# Patient Record
Sex: Female | Born: 1980
Health system: Southern US, Community
[De-identification: ages and names within clinical notes are randomized; demographics above are authoritative.]

## PROBLEM LIST (undated history)

## (undated) ENCOUNTER — Inpatient Hospital Stay (HOSPITAL_COMMUNITY): Payer: Self-pay

## (undated) ENCOUNTER — Emergency Department (HOSPITAL_COMMUNITY): Admission: EM | Payer: Self-pay | Source: Home / Self Care

## (undated) DIAGNOSIS — O139 Gestational [pregnancy-induced] hypertension without significant proteinuria, unspecified trimester: Secondary | ICD-10-CM

## (undated) DIAGNOSIS — D62 Acute posthemorrhagic anemia: Secondary | ICD-10-CM

## (undated) DIAGNOSIS — O99019 Anemia complicating pregnancy, unspecified trimester: Secondary | ICD-10-CM

## (undated) DIAGNOSIS — O009 Unspecified ectopic pregnancy without intrauterine pregnancy: Secondary | ICD-10-CM

## (undated) DIAGNOSIS — D509 Iron deficiency anemia, unspecified: Secondary | ICD-10-CM

## (undated) DIAGNOSIS — I2699 Other pulmonary embolism without acute cor pulmonale: Secondary | ICD-10-CM

## (undated) HISTORY — PX: HERNIA REPAIR: SHX51

## (undated) HISTORY — PX: HIP SURGERY: SHX245

## (undated) HISTORY — PX: BARIATRIC SURGERY: SHX1103

---

## 2011-12-06 ENCOUNTER — Emergency Department (HOSPITAL_COMMUNITY)
Admission: EM | Admit: 2011-12-06 | Discharge: 2011-12-06 | Disposition: A | Discharge status: Home / Self Care | Payer: Self-pay | Source: Home / Self Care | Attending: Family Medicine | Admitting: Family Medicine

## 2011-12-06 ENCOUNTER — Encounter (HOSPITAL_COMMUNITY): Payer: Self-pay | Admitting: *Deleted

## 2011-12-06 DIAGNOSIS — N912 Amenorrhea, unspecified: Secondary | ICD-10-CM

## 2011-12-06 DIAGNOSIS — N911 Secondary amenorrhea: Secondary | ICD-10-CM

## 2011-12-06 HISTORY — DX: Unspecified ectopic pregnancy without intrauterine pregnancy: O00.90

## 2011-12-06 LAB — POCT URINALYSIS DIP (DEVICE)
Bilirubin Urine: NEGATIVE
Hgb urine dipstick: NEGATIVE
Nitrite: NEGATIVE
Specific Gravity, Urine: 1.025 (ref 1.005–1.030)
pH: 5.5 (ref 5.0–8.0)

## 2011-12-06 NOTE — ED Notes (Signed)
Vitals obtained by RT student and RN Alinda Money

## 2011-12-06 NOTE — ED Notes (Signed)
Pt  Here  For  evaul of  Possible  Pregnancy   -  She  Reports  She  Has  Had   4  Pos  Home  Tests   And  2  Neg  Tests     -  She  Also  Reports  Some low abd  Pain   And  Nausea    -    She    denys  Any  Vag bleeding or  Any  Discharge      And  She  Appears  In no  Severe  Distress       She  Reports  History  Of      htn  During  Previous  preg  And       She   Also  Reports  History  Of    Ectopic    preg in past

## 2011-12-06 NOTE — Discharge Instructions (Signed)
We will call with test results, see doctor if further issues

## 2011-12-06 NOTE — ED Provider Notes (Signed)
History     CSN: [id number]  Arrival date & time 12/06/11  1405   First MD Initiated Contact with Patient 12/06/11 1601      Chief Complaint  Patient presents with  . Possible Pregnancy    (Consider location/radiation/quality/duration/timing/severity/associated sxs/prior treatment) Patient is a 31 y.o. female presenting with female genitourinary complaint. The history is provided by the patient.  Female GU Problem Primary symptoms include no pelvic pain and no vaginal bleeding. There has been no fever. This is a new problem. Episode onset: lmp in may. The problem has not changed since onset.Pregnant now: has had pos and neg home tests. She has missed her period. The patient's menstrual history has been regular. Sexual activity: sexually active.    Past Medical History  Diagnosis Date  . Ectopic pregnancy     Past Surgical History  Procedure Date  . Hip surgery     No family history on file.  History  Substance Use Topics  . Smoking status: Not on file  . Smokeless tobacco: Not on file  . Alcohol Use: Yes    OB History    Grav Para Term Preterm Abortions TAB SAB Ect Mult Living                  Review of Systems  Constitutional: Negative.   Genitourinary: Positive for menstrual problem. Negative for vaginal bleeding, vaginal discharge, vaginal pain and pelvic pain.    Allergies  Review of patient's allergies indicates no known allergies.  Home Medications  No current outpatient prescriptions on file.  BP 155/109  Pulse 91  Temp 99.5 F (37.5 C) (Oral)  Resp 20  SpO2 99%  LMP 11/08/2011  Physical Exam  Nursing note and vitals reviewed. Constitutional: She is oriented to person, place, and time. She appears well-developed and well-nourished.  Abdominal: Soft. Bowel sounds are normal. She exhibits no distension and no mass. There is no tenderness. There is no rebound and no guarding.  Neurological: She is alert and oriented to person, place, and time.    Skin: Skin is warm and dry. No erythema.    ED Course  Procedures (including critical care time)  Labs Reviewed  POCT URINALYSIS DIP (DEVICE) - Abnormal; Notable for the following:    Ketones, ur TRACE (*)     All other components within normal limits  POCT PREGNANCY, URINE  HCG, SERUM, QUALITATIVE   No results found.   1. Amenorrhea, secondary       MDM  u preg neg.        Linna Hoff, MD 12/06/11 1705

## 2011-12-29 ENCOUNTER — Encounter: Payer: Self-pay | Admitting: Obstetrics & Gynecology

## 2012-01-02 ENCOUNTER — Encounter: Payer: 59 | Admitting: Obstetrics & Gynecology

## 2012-01-03 ENCOUNTER — Ambulatory Visit (INDEPENDENT_AMBULATORY_CARE_PROVIDER_SITE_OTHER): Payer: 59 | Admitting: Obstetrics & Gynecology

## 2012-01-03 ENCOUNTER — Encounter: Payer: Self-pay | Admitting: Obstetrics & Gynecology

## 2012-01-03 DIAGNOSIS — Z124 Encounter for screening for malignant neoplasm of cervix: Secondary | ICD-10-CM

## 2012-01-03 DIAGNOSIS — Z113 Encounter for screening for infections with a predominantly sexual mode of transmission: Secondary | ICD-10-CM

## 2012-01-03 DIAGNOSIS — Z86711 Personal history of pulmonary embolism: Secondary | ICD-10-CM | POA: Insufficient documentation

## 2012-01-03 DIAGNOSIS — Z01419 Encounter for gynecological examination (general) (routine) without abnormal findings: Secondary | ICD-10-CM

## 2012-01-03 NOTE — Patient Instructions (Signed)
Contraception Choices Contraception (birth control) is the use of any methods or devices to prevent pregnancy. Below are some methods to help avoid pregnancy. HORMONAL METHODS   Contraceptive implant. This is a thin, plastic tube containing progesterone hormone. It does not contain estrogen hormone. Your caregiver inserts the tube in the inner part of the upper arm. The tube can remain in place for up to 3 years. After 3 years, the implant must be removed. The implant prevents the ovaries from releasing an egg (ovulation), thickens the cervical mucus which prevents sperm from entering the uterus, and thins the lining of the inside of the uterus.   Progesterone-only injections. These injections are given every 3 months by your caregiver to prevent pregnancy. This synthetic progesterone hormone stops the ovaries from releasing eggs. It also thickens cervical mucus and changes the uterine lining. This makes it harder for sperm to survive in the uterus.   Birth control pills. These pills contain estrogen and progesterone hormone. They work by stopping the egg from forming in the ovary (ovulation). Birth control pills are prescribed by a caregiver.Birth control pills can also be used to treat heavy periods.   Minipill. This type of birth control pill contains only the progesterone hormone. They are taken every day of each month and must be prescribed by your caregiver.   Birth control patch. The patch contains hormones similar to those in birth control pills. It must be changed once a week and is prescribed by a caregiver.   Vaginal ring. The ring contains hormones similar to those in birth control pills. It is left in the vagina for 3 weeks, removed for 1 week, and then a new one is put back in place. The patient must be comfortable inserting and removing the ring from the vagina.A caregiver's prescription is necessary.   Emergency contraception. Emergency contraceptives prevent pregnancy after  unprotected sexual intercourse. This pill can be taken right after sex or up to 5 days after unprotected sex. It is most effective the sooner you take the pills after having sexual intercourse. Emergency contraceptive pills are available without a prescription. Check with your pharmacist. Do not use emergency contraception as your only form of birth control.  BARRIER METHODS   Female condom. This is a thin sheath (latex or rubber) that is worn over the penis during sexual intercourse. It can be used with spermicide to increase effectiveness.   Female condom. This is a soft, loose-fitting sheath that is put into the vagina before sexual intercourse.   Diaphragm. This is a soft, latex, dome-shaped barrier that must be fitted by a caregiver. It is inserted into the vagina, along with a spermicidal jelly. It is inserted before intercourse. The diaphragm should be left in the vagina for 6 to 8 hours after intercourse.   Cervical cap. This is a round, soft, latex or plastic cup that fits over the cervix and must be fitted by a caregiver. The cap can be left in place for up to 48 hours after intercourse.   Sponge. This is a soft, circular piece of polyurethane foam. The sponge has spermicide in it. It is inserted into the vagina after wetting it and before sexual intercourse.   Spermicides. These are chemicals that kill or block sperm from entering the cervix and uterus. They come in the form of creams, jellies, suppositories, foam, or tablets. They do not require a prescription. They are inserted into the vagina with an applicator before having sexual intercourse. The process must be   repeated every time you have sexual intercourse.  INTRAUTERINE CONTRACEPTION  Intrauterine device (IUD). This is a T-shaped device that is put in a woman's uterus during a menstrual period to prevent pregnancy. There are 2 types:   Copper IUD. This type of IUD is wrapped in copper wire and is placed inside the uterus. Copper  makes the uterus and fallopian tubes produce a fluid that kills sperm. It can stay in place for 10 years.   Hormone IUD. This type of IUD contains the hormone progestin (synthetic progesterone). The hormone thickens the cervical mucus and prevents sperm from entering the uterus, and it also thins the uterine lining to prevent implantation of a fertilized egg. The hormone can weaken or kill the sperm that get into the uterus. It can stay in place for 5 years.  PERMANENT METHODS OF CONTRACEPTION  Female tubal ligation. This is when the woman's fallopian tubes are surgically sealed, tied, or blocked to prevent the egg from traveling to the uterus.   Female sterilization. This is when the female has the tubes that carry sperm tied off (vasectomy).This blocks sperm from entering the vagina during sexual intercourse. After the procedure, the man can still ejaculate fluid (semen).  NATURAL PLANNING METHODS  Natural family planning. This is not having sexual intercourse or using a barrier method (condom, diaphragm, cervical cap) on days the woman could become pregnant.   Calendar method. This is keeping track of the length of each menstrual cycle and identifying when you are fertile.   Ovulation method. This is avoiding sexual intercourse during ovulation.   Symptothermal method. This is avoiding sexual intercourse during ovulation, using a thermometer and ovulation symptoms.   Post-ovulation method. This is timing sexual intercourse after you have ovulated.  Regardless of which type or method of contraception you choose, it is important that you use condoms to protect against the transmission of sexually transmitted diseases (STDs). Talk with your caregiver about which form of contraception is most appropriate for you. Document Released: 05/29/2005 Document Revised: 05/18/2011 Document Reviewed: 10/05/2010 [employer] Patient Information 2012 [employer]. 

## 2012-01-03 NOTE — Progress Notes (Signed)
Patient ID: Yvonne Ali, female   DOB: Mar 23, 1981, 31 y.o.   MRN: [id number]  Chief Complaint  Patient presents with  . Gynecologic Exam    HPI Yvonne Ali is a 31 y.o. female.  Regular menses. Patient's last menstrual period was 12/08/2011. Patient uses condoms for birth control. She has a history of pulmonary embolism while using oral contraceptives as a teen. She has had HPV on Pap smears in the past but she has had normal followup. HPI  Past Medical History  Diagnosis Date  . Ectopic pregnancy     Past Surgical History  Procedure Date  . Hip surgery   . Bariatric surgery     gastric sleeve    Family History  Problem Relation Age of Onset  . Diabetes Maternal Grandmother   . Hypertension Maternal Grandfather   . Hyperlipidemia Maternal Grandfather   . Cancer Paternal Grandmother 46    ovarian cancer    Social History History  Substance Use Topics  . Smoking status: Never Smoker   . Smokeless tobacco: Never Used  . Alcohol Use: Yes     socially    No Known Allergies  No current outpatient prescriptions on file.    Review of Systems Review of Systems  Constitutional: Unexpected weight change: voluntary wgt loss.  Respiratory: Negative for shortness of breath.   Cardiovascular: Negative for leg swelling.  Gastrointestinal: Negative for diarrhea and constipation.  Genitourinary: Positive for frequency. Negative for vaginal discharge and pelvic pain.  Neurological: Negative for headaches.    Blood pressure 114/81, pulse 93, height 5\' 10"  (1.778 m), weight 140.615 kg (310 lb), last menstrual period 12/08/2011.  Physical Exam Physical Exam  Constitutional: No distress (obese).  Neck: Normal range of motion. Neck supple. No thyromegaly present.  Cardiovascular: Normal rate, regular rhythm and normal heart sounds.   Pulmonary/Chest: Effort normal and breath sounds normal.  Abdominal: Soft. She exhibits no distension and no mass. There is no tenderness.    Genitourinary: Vagina normal and uterus normal. No vaginal discharge (no mass) found.  Skin: Skin is warm and dry.  Psychiatric: She has a normal mood and affect. Her behavior is normal.    Data Reviewed Progress note from June   Assessment    Morbid obesity with good weight loss since bariatric surgery was performed. Regular menses. History of pulmonary embolism. Patient wishes to continue to use condoms for contraception.    Plan    Pap test STD testing and HIV testing done by her request. She can return for yearly examinations.       Loel Betancur 01/03/2012, 11:28 AM

## 2012-01-04 LAB — HIV ANTIBODY (ROUTINE TESTING W REFLEX): HIV: NONREACTIVE

## 2012-08-26 ENCOUNTER — Other Ambulatory Visit (INDEPENDENT_AMBULATORY_CARE_PROVIDER_SITE_OTHER): Payer: 59 | Admitting: *Deleted

## 2012-08-26 DIAGNOSIS — N912 Amenorrhea, unspecified: Secondary | ICD-10-CM

## 2012-08-26 LAB — POCT URINE PREGNANCY: Preg Test, Ur: NEGATIVE

## 2012-08-26 NOTE — Progress Notes (Signed)
Patient came today thinking her appointment with the physician was this morning when in actuality it is this afternoon at 1:15.  She is going to need to reschedule but would like a pregnancy test.  She is currently 8 days late for her period.  Her test is negative in the office today.  I have advised her to recheck a test in 5-7 days and follow up with [place] if it continues to be negative or of course if it is positive.  She agrees and went ahead and made a follow up appointment.

## 2012-09-03 ENCOUNTER — Ambulatory Visit: Payer: 59 | Admitting: Obstetrics & Gynecology

## 2012-09-03 ENCOUNTER — Other Ambulatory Visit (INDEPENDENT_AMBULATORY_CARE_PROVIDER_SITE_OTHER): Payer: 59 | Admitting: *Deleted

## 2012-09-03 ENCOUNTER — Encounter: Payer: Self-pay | Admitting: *Deleted

## 2012-09-03 DIAGNOSIS — N912 Amenorrhea, unspecified: Secondary | ICD-10-CM

## 2012-09-03 DIAGNOSIS — R102 Pelvic and perineal pain: Secondary | ICD-10-CM

## 2012-09-03 LAB — COMPREHENSIVE METABOLIC PANEL
Alkaline Phosphatase: 54 U/L (ref 39–117)
BUN: 11 mg/dL (ref 6–23)
CO2: 25 mEq/L (ref 19–32)
Creat: 0.82 mg/dL (ref 0.50–1.10)
Glucose, Bld: 78 mg/dL (ref 70–99)
Sodium: 140 mEq/L (ref 135–145)
Total Bilirubin: 0.3 mg/dL (ref 0.3–1.2)

## 2012-09-03 LAB — POCT URINALYSIS DIPSTICK
Bilirubin, UA: NEGATIVE
Glucose, UA: NEGATIVE
Ketones, UA: NEGATIVE
Leukocytes, UA: NEGATIVE
Spec Grav, UA: >=1.03

## 2012-09-03 LAB — TSH: TSH: 0.426 u[IU]/mL (ref 0.350–4.500)

## 2012-09-03 NOTE — Progress Notes (Signed)
Patients cycle was 10 days late this past time and it was suggested she have blood work done at her last visit to determine what might be going on with the irregularities.  She is also having some right lower quadrant pain and feels like she needs to be checked for a uti.  Blood work done and urine dip is negative.  Patient will make a follow up appointment to be seen to follow up on her discomfort and her labs.

## 2012-09-04 LAB — FOLLICLE STIMULATING HORMONE: FSH: 3.1 m[IU]/mL

## 2012-09-04 LAB — TESTOSTERONE: Testosterone: 68 ng/dL (ref 10–70)

## 2012-11-27 ENCOUNTER — Ambulatory Visit (INDEPENDENT_AMBULATORY_CARE_PROVIDER_SITE_OTHER): Payer: 59 | Admitting: Family Medicine

## 2012-11-27 VITALS — BP 119/92 | Wt 333.0 lb

## 2012-11-27 DIAGNOSIS — Z3481 Encounter for supervision of other normal pregnancy, first trimester: Secondary | ICD-10-CM

## 2012-11-27 DIAGNOSIS — Z3480 Encounter for supervision of other normal pregnancy, unspecified trimester: Secondary | ICD-10-CM

## 2012-11-27 DIAGNOSIS — Z8742 Personal history of other diseases of the female genital tract: Secondary | ICD-10-CM

## 2012-11-27 DIAGNOSIS — Z8759 Personal history of other complications of pregnancy, childbirth and the puerperium: Secondary | ICD-10-CM

## 2012-11-27 NOTE — Progress Notes (Signed)
P-87.  Pt would like to have an ultrasound due to history of ectopic pregnancy in the past.

## 2012-11-28 ENCOUNTER — Ambulatory Visit (HOSPITAL_COMMUNITY): Admission: RE | Admit: 2012-11-28 | Payer: 59 | Source: Ambulatory Visit

## 2012-11-28 ENCOUNTER — Ambulatory Visit (HOSPITAL_COMMUNITY)
Admission: RE | Admit: 2012-11-28 | Discharge: 2012-11-28 | Disposition: A | Discharge status: Home / Self Care | Payer: 59 | Source: Ambulatory Visit | Attending: Obstetrics & Gynecology | Admitting: Obstetrics & Gynecology

## 2012-11-28 DIAGNOSIS — Z8759 Personal history of other complications of pregnancy, childbirth and the puerperium: Secondary | ICD-10-CM

## 2012-11-28 DIAGNOSIS — O3680X0 Pregnancy with inconclusive fetal viability, not applicable or unspecified: Secondary | ICD-10-CM | POA: Insufficient documentation

## 2012-11-28 DIAGNOSIS — Z3689 Encounter for other specified antenatal screening: Secondary | ICD-10-CM | POA: Insufficient documentation

## 2012-11-28 DIAGNOSIS — E669 Obesity, unspecified: Secondary | ICD-10-CM | POA: Insufficient documentation

## 2012-11-28 DIAGNOSIS — O99210 Obesity complicating pregnancy, unspecified trimester: Secondary | ICD-10-CM | POA: Insufficient documentation

## 2012-12-01 LAB — CULTURE, OB URINE
Colony Count: NO GROWTH
Organism ID, Bacteria: NO GROWTH

## 2012-12-10 ENCOUNTER — Ambulatory Visit (INDEPENDENT_AMBULATORY_CARE_PROVIDER_SITE_OTHER): Payer: 59 | Admitting: Obstetrics and Gynecology

## 2012-12-10 ENCOUNTER — Encounter: Payer: Self-pay | Admitting: Obstetrics and Gynecology

## 2012-12-10 VITALS — BP 135/85 | Wt 337.0 lb

## 2012-12-10 DIAGNOSIS — O99214 Obesity complicating childbirth: Secondary | ICD-10-CM

## 2012-12-10 DIAGNOSIS — Z86711 Personal history of pulmonary embolism: Secondary | ICD-10-CM

## 2012-12-10 DIAGNOSIS — Z1151 Encounter for screening for human papillomavirus (HPV): Secondary | ICD-10-CM

## 2012-12-10 DIAGNOSIS — O0990 Supervision of high risk pregnancy, unspecified, unspecified trimester: Secondary | ICD-10-CM

## 2012-12-10 DIAGNOSIS — O99210 Obesity complicating pregnancy, unspecified trimester: Secondary | ICD-10-CM | POA: Insufficient documentation

## 2012-12-10 DIAGNOSIS — Z124 Encounter for screening for malignant neoplasm of cervix: Secondary | ICD-10-CM

## 2012-12-10 DIAGNOSIS — Z113 Encounter for screening for infections with a predominantly sexual mode of transmission: Secondary | ICD-10-CM

## 2012-12-10 DIAGNOSIS — O99211 Obesity complicating pregnancy, first trimester: Secondary | ICD-10-CM

## 2012-12-10 DIAGNOSIS — O0991 Supervision of high risk pregnancy, unspecified, first trimester: Secondary | ICD-10-CM

## 2012-12-10 MED ORDER — ENOXAPARIN SODIUM 40 MG/0.4ML ~~LOC~~ SOLN: 40.0000 mg | SUBCUTANEOUS | Status: DC

## 2012-12-10 NOTE — Progress Notes (Signed)
P-85 

## 2012-12-10 NOTE — Progress Notes (Signed)
   Subjective:    Yvonne Ali is a G2P0010 [redacted]w[redacted]d being seen today for her first obstetrical visit.  Her obstetrical history is significant for prior ectopic pregancy, obesity in pregnancy s/p bariatric surgery and history of PE associated with OCP usage. Patient does intend to breast feed. Pregnancy history fully reviewed.  Patient reports nausea.  Filed Vitals:   12/10/12 0924  BP: 135/85  Weight: 337 lb (152.862 kg)    HISTORY: OB History   Grav Para Term Preterm Abortions TAB SAB Ect Mult Living   2    1   1        # Outc Date GA Lbr Len/2nd Wgt Sex Del Anes PTL Lv   1 ECT            2 CUR              Past Medical History  Diagnosis Date  . Ectopic pregnancy    Past Surgical History  Procedure Laterality Date  . Hip surgery    . Bariatric surgery      gastric sleeve   Family History  Problem Relation Age of Onset  . Hypertension Maternal Grandfather   . Hyperlipidemia Maternal Grandfather   . Cancer Paternal Grandmother 59    ovarian cancer  . Diabetes Paternal Grandmother      Exam    Uterus:     Pelvic Exam:    Perineum: Normal Perineum   Vulva: normal   Vagina:  normal mucosa   pH:    Cervix: closed, long, posterior   Adnexa: no mass, fullness, tenderness   Bony Pelvis: android  System: Breast:  normal appearance, no masses or tenderness, No nipple retraction or dimpling, No nipple discharge or bleeding, No axillary or supraclavicular adenopathy   Skin: normal coloration and turgor, no rashes    Neurologic: oriented, no focal deficits   Extremities: normal strength, tone, and muscle mass   HEENT extra ocular movement intact   Mouth/Teeth dental hygiene good   Neck supple and no masses   Cardiovascular: regular rate and rhythm   Respiratory:  chest clear, no wheezing, crepitations, rhonchi, normal symmetric air entry   Abdomen: soft, NT, obese   Urinary:       Assessment:    Pregnancy: G2P0010 Patient Active Problem List   Diagnosis  Date Noted  . Supervision of high risk pregnancy in first trimester 12/10/2012    Priority: Medium  . Obesities, morbid 01/03/2012    Priority: Medium  . History of pulmonary embolus (PE) 01/03/2012    Priority: Medium        Plan:     Initial labs drawn- difficult access to vein secondary to body habitus Prenatal vitamins. Problem list reviewed and updated. Genetic Screening discussed First Screen: requested.  Ultrasound discussed; fetal survey: requested. Will be ordered at subsequent visit Given history of PE in the past with start patient on prophylactic lovenox dosage 40 mg Edom daily. Patient is familiar with lovenox injection and declined teaching Patient with morbid obesity- will need 1 hr GCT at next visit Discussed no more than 20 lb weigh gain in pregnacy Encouraged exercise regimen  Follow up in 4 weeks. 50% of 30 min visit spent on counseling and coordination of care.     Yvonne Ali 12/10/2012

## 2012-12-10 NOTE — Patient Instructions (Signed)
Pregnancy - First Trimester During sexual intercourse, millions of sperm go into the vagina. Only 1 sperm will penetrate and fertilize the female egg while it is in the Fallopian tube. One week later, the fertilized egg implants into the wall of the uterus. An embryo begins to develop into a baby. At 6 to 8 weeks, the eyes and face are formed and the heartbeat can be seen on ultrasound. At the end of 12 weeks (first trimester), all the baby's organs are formed. Now that you are pregnant, you will want to do everything you can to have a healthy baby. Two of the most important things are to get good prenatal care and follow your caregiver's instructions. Prenatal care is all the medical care you receive before the baby's birth. It is given to prevent, find, and treat problems during the pregnancy and childbirth. PRENATAL EXAMS  During prenatal visits, your weight, blood pressure, and urine are checked. This is done to make sure you are healthy and progressing normally during the pregnancy.  A pregnant woman should gain 25 to 35 pounds during the pregnancy. However, if you are overweight or underweight, your caregiver will advise you regarding your weight.  Your caregiver will ask and answer questions for you.  Blood work, cervical cultures, other necessary tests, and a Pap test are done during your prenatal exams. These tests are done to check on your health and the probable health of your baby. Tests are strongly recommended and done for HIV with your permission. This is the virus that causes AIDS. These tests are done because medicines can be given to help prevent your baby from being born with this infection should you have been infected without knowing it. Blood work is also used to find out your blood type, previous infections, and follow your blood levels (hemoglobin).  Low hemoglobin (anemia) is common during pregnancy. Iron and vitamins are given to help prevent this. Later in the pregnancy,  blood tests for diabetes will be done along with any other tests if any problems develop.  You may need other tests to make sure you and the baby are doing well. CHANGES DURING THE FIRST TRIMESTER  Your body goes through many changes during pregnancy. They vary from person to person. Talk to your caregiver about changes you notice and are concerned about. Changes can include:  Your menstrual period stops.  The egg and sperm carry the genes that determine what you look like. Genes from you and your partner are forming a baby. The female genes determine whether the baby is a boy or a girl.  Your body increases in girth and you may feel bloated.  Feeling sick to your stomach (nauseous) and throwing up (vomiting). If the vomiting is uncontrollable, call your caregiver.  Your breasts will begin to enlarge and become tender.  Your nipples may stick out more and become darker.  The need to urinate more. Painful urination may mean you have a bladder infection.  Tiring easily.  Loss of appetite.  Cravings for certain kinds of food.  At first, you may gain or lose a couple of pounds.  You may have changes in your emotions from day to day (excited to be pregnant or concerned something may go wrong with the pregnancy and baby).  You may have more vivid and strange dreams. HOME CARE INSTRUCTIONS   It is very important to avoid all smoking, alcohol and non-prescribed drugs during your pregnancy. These affect the formation and growth of the baby.   Avoid chemicals while pregnant to ensure the delivery of a healthy infant.  Start your prenatal visits by the 12th week of pregnancy. They are usually scheduled monthly at first, then more often in the last 2 months before delivery. Keep your caregiver's appointments. Follow your caregiver's instructions regarding medicine use, blood and lab tests, exercise, and diet.  During pregnancy, you are providing food for you and your baby. Eat regular,  well-balanced meals. Choose foods such as meat, fish, milk and other low fat dairy products, vegetables, fruits, and whole-grain breads and cereals. Your caregiver will tell you of the ideal weight gain.  You can help morning sickness by keeping soda crackers at the bedside. Eat a couple before arising in the morning. You may want to use the crackers without salt on them.  Eating 4 to 5 small meals rather than 3 large meals a day also may help the nausea and vomiting.  Drinking liquids between meals instead of during meals also seems to help nausea and vomiting.  A physical sexual relationship may be continued throughout pregnancy if there are no other problems. Problems may be early (premature) leaking of amniotic fluid from the membranes, vaginal bleeding, or belly (abdominal) pain.  Exercise regularly if there are no restrictions. Check with your caregiver or physical therapist if you are unsure of the safety of some of your exercises. Greater weight gain will occur in the last 2 trimesters of pregnancy. Exercising will help:  Control your weight.  Keep you in shape.  Prepare you for labor and delivery.  Help you lose your pregnancy weight after you deliver your baby.  Wear a good support or jogging bra for breast tenderness during pregnancy. This may help if worn during sleep too.  Ask when prenatal classes are available. Begin classes when they are offered.  Do not use hot tubs, steam rooms, or saunas.  Wear your seat belt when driving. This protects you and your baby if you are in an accident.  Avoid raw meat, uncooked cheese, cat litter boxes, and soil used by cats throughout the pregnancy. These carry germs that can cause birth defects in the baby.  The first trimester is a good time to visit your dentist for your dental health. Getting your teeth cleaned is okay. Use a softer toothbrush and brush gently during pregnancy.  Ask for help if you have financial, counseling, or  nutritional needs during pregnancy. Your caregiver will be able to offer counseling for these needs as well as refer you for other special needs.  Do not take any medicines or herbs unless told by your caregiver.  Inform your caregiver if there is any mental or physical domestic violence.  Make a list of emergency phone numbers of family, friends, hospital, and police and fire departments.  Write down your questions. Take them to your prenatal visit.  Do not douche.  Do not cross your legs.  If you have to stand for long periods of time, rotate you feet or take small steps in a circle.  You may have more vaginal secretions that may require a sanitary pad. Do not use tampons or scented sanitary pads. MEDICINES AND DRUG USE IN PREGNANCY  Take prenatal vitamins as directed. The vitamin should contain 1 milligram of folic acid. Keep all vitamins out of reach of children. Only a couple vitamins or tablets containing iron may be fatal to a baby or young child when ingested.  Avoid use of all medicines, including herbs, over-the-counter medicines, not   prescribed or suggested by your caregiver. Only take over-the-counter or prescription medicines for pain, discomfort, or fever as directed by your caregiver. Do not use aspirin, ibuprofen, or naproxen unless directed by your caregiver.  Let your caregiver also know about herbs you may be using.  Alcohol is related to a number of birth defects. This includes fetal alcohol syndrome. All alcohol, in any form, should be avoided completely. Smoking will cause low birth rate and premature babies.  Street or illegal drugs are very harmful to the baby. They are absolutely forbidden. A baby born to an addicted mother will be addicted at birth. The baby will go through the same withdrawal an adult does.  Let your caregiver know about any medicines that you have to take and for what reason you take them. SEEK MEDICAL CARE IF:  You have any concerns or  worries during your pregnancy. It is better to call with your questions if you feel they cannot wait, rather than worry about them. SEEK IMMEDIATE MEDICAL CARE IF:   An unexplained oral temperature above 102 F (38.9 C) develops, or as your caregiver suggests.  You have leaking of fluid from the vagina (birth canal). If leaking membranes are suspected, take your temperature and inform your caregiver of this when you call.  There is vaginal spotting or bleeding. Notify your caregiver of the amount and how many pads are used.  You develop a bad smelling vaginal discharge with a change in the color.  You continue to feel sick to your stomach (nauseated) and have no relief from remedies suggested. You vomit blood or coffee ground-like materials.  You lose more than 2 pounds of weight in 1 week.  You gain more than 2 pounds of weight in 1 week and you notice swelling of your face, hands, feet, or legs.  You gain 5 pounds or more in 1 week (even if you do not have swelling of your hands, face, legs, or feet).  You get exposed to German measles and have never had them.  You are exposed to fifth disease or chickenpox.  You develop belly (abdominal) pain. Round ligament discomfort is a common non-cancerous (benign) cause of abdominal pain in pregnancy. Your caregiver still must evaluate this.  You develop headache, fever, diarrhea, pain with urination, or shortness of breath.  You fall or are in a car accident or have any kind of trauma.  There is mental or physical violence in your home. Document Released: 05/23/2001 Document Revised: 02/21/2012 Document Reviewed: 11/24/2008 [employer] Patient Information 2014 [employer].  Contraception Choices Contraception (birth control) is the use of any methods or devices to prevent pregnancy. Below are some methods to help avoid pregnancy. HORMONAL METHODS   Contraceptive implant. This is a thin, plastic tube containing progesterone hormone. It  does not contain estrogen hormone. Your caregiver inserts the tube in the inner part of the upper arm. The tube can remain in place for up to 3 years. After 3 years, the implant must be removed. The implant prevents the ovaries from releasing an egg (ovulation), thickens the cervical mucus which prevents sperm from entering the uterus, and thins the lining of the inside of the uterus.  Progesterone-only injections. These injections are given every 3 months by your caregiver to prevent pregnancy. This synthetic progesterone hormone stops the ovaries from releasing eggs. It also thickens cervical mucus and changes the uterine lining. This makes it harder for sperm to survive in the uterus.  Birth control pills. These pills   contain estrogen and progesterone hormone. They work by stopping the egg from forming in the ovary (ovulation). Birth control pills are prescribed by a caregiver.Birth control pills can also be used to treat heavy periods.  Minipill. This type of birth control pill contains only the progesterone hormone. They are taken every day of each month and must be prescribed by your caregiver.  Birth control patch. The patch contains hormones similar to those in birth control pills. It must be changed once a week and is prescribed by a caregiver.  Vaginal ring. The ring contains hormones similar to those in birth control pills. It is left in the vagina for 3 weeks, removed for 1 week, and then a new one is put back in place. The patient must be comfortable inserting and removing the ring from the vagina.A caregiver's prescription is necessary.  Emergency contraception. Emergency contraceptives prevent pregnancy after unprotected sexual intercourse. This pill can be taken right after sex or up to 5 days after unprotected sex. It is most effective the sooner you take the pills after having sexual intercourse. Emergency contraceptive pills are available without a prescription. Check with your  pharmacist. Do not use emergency contraception as your only form of birth control. BARRIER METHODS   Female condom. This is a thin sheath (latex or rubber) that is worn over the penis during sexual intercourse. It can be used with spermicide to increase effectiveness.  Female condom. This is a soft, loose-fitting sheath that is put into the vagina before sexual intercourse.  Diaphragm. This is a soft, latex, dome-shaped barrier that must be fitted by a caregiver. It is inserted into the vagina, along with a spermicidal jelly. It is inserted before intercourse. The diaphragm should be left in the vagina for 6 to 8 hours after intercourse.  Cervical cap. This is a round, soft, latex or plastic cup that fits over the cervix and must be fitted by a caregiver. The cap can be left in place for up to 48 hours after intercourse.  Sponge. This is a soft, circular piece of polyurethane foam. The sponge has spermicide in it. It is inserted into the vagina after wetting it and before sexual intercourse.  Spermicides. These are chemicals that kill or block sperm from entering the cervix and uterus. They come in the form of creams, jellies, suppositories, foam, or tablets. They do not require a prescription. They are inserted into the vagina with an applicator before having sexual intercourse. The process must be repeated every time you have sexual intercourse. INTRAUTERINE CONTRACEPTION  Intrauterine device (IUD). This is a T-shaped device that is put in a woman's uterus during a menstrual period to prevent pregnancy. There are 2 types:  Copper IUD. This type of IUD is wrapped in copper wire and is placed inside the uterus. Copper makes the uterus and fallopian tubes produce a fluid that kills sperm. It can stay in place for 10 years.  Hormone IUD. This type of IUD contains the hormone progestin (synthetic progesterone). The hormone thickens the cervical mucus and prevents sperm from entering the uterus, and it  also thins the uterine lining to prevent implantation of a fertilized egg. The hormone can weaken or kill the sperm that get into the uterus. It can stay in place for 5 years. PERMANENT METHODS OF CONTRACEPTION  Female tubal ligation. This is when the woman's fallopian tubes are surgically sealed, tied, or blocked to prevent the egg from traveling to the uterus.  Female sterilization. This is when   the female has the tubes that carry sperm tied off (vasectomy).This blocks sperm from entering the vagina during sexual intercourse. After the procedure, the man can still ejaculate fluid (semen). NATURAL PLANNING METHODS  Natural family planning. This is not having sexual intercourse or using a barrier method (condom, diaphragm, cervical cap) on days the woman could become pregnant.  Calendar method. This is keeping track of the length of each menstrual cycle and identifying when you are fertile.  Ovulation method. This is avoiding sexual intercourse during ovulation.  Symptothermal method. This is avoiding sexual intercourse during ovulation, using a thermometer and ovulation symptoms.  Post-ovulation method. This is timing sexual intercourse after you have ovulated. Regardless of which type or method of contraception you choose, it is important that you use condoms to protect against the transmission of sexually transmitted diseases (STDs). Talk with your caregiver about which form of contraception is most appropriate for you. Document Released: 05/29/2005 Document Revised: 08/21/2011 Document Reviewed: 10/05/2010 [employer] Patient Information 2014 [employer].  Breastfeeding A change in hormones during your pregnancy causes growth of your breast tissue and an increase in number and size of milk ducts. The hormone prolactin allows proteins, sugars, and fats from your blood supply to make breast milk in your milk-producing glands. The hormone progesterone prevents breast milk from being released  before the birth of your baby. After the birth of your baby, your progesterone level decreases allowing breast milk to be released. Thoughts of your baby, as well as his or her sucking or crying, can stimulate the release of milk from the milk-producing glands. Deciding to breastfeed (nurse) is one of the best choices you can make for you and your baby. The information that follows gives a brief review of the benefits, as well as other important skills to know about breastfeeding. BENEFITS OF BREASTFEEDING For your baby  The first milk (colostrum) helps your baby's digestive system function better.   There are antibodies in your milk that help your baby fight off infections.   Your baby has a lower incidence of asthma, allergies, and sudden infant death syndrome (SIDS).   The nutrients in breast milk are better for your baby than infant formulas.  Breast milk improves your baby's brain development.   Your baby will have less gas, colic, and constipation.  Your baby is less likely to develop other conditions, such as childhood obesity, asthma, or diabetes mellitus. For you  Breastfeeding helps develop a very special bond between you and your baby.   Breastfeeding is convenient, always available at the correct temperature, and costs nothing.   Breastfeeding helps to burn calories and helps you lose the weight gained during pregnancy.   Breastfeeding makes your uterus contract back down to normal size faster and slows bleeding following delivery.   Breastfeeding mothers have a lower risk of developing osteoporosis or breast or ovarian cancer later in life.  BREASTFEEDING FREQUENCY  A healthy, full-term baby may breastfeed as often as every hour or space his or her feedings to every 3 hours. Breastfeeding frequency will vary from baby to baby.   Newborns should be fed no less than every 2 3 hours during the day and every 4 5 hours during the night. You should breastfeed a  minimum of 8 feedings in a 24 hour period.  Awaken your baby to breastfeed if it has been 3 4 hours since the last feeding.  Breastfeed when you feel the need to reduce the fullness of your breasts or when   your newborn shows signs of hunger. Signs that your baby may be hungry include:  Increased alertness or activity.  Stretching.  Movement of the head from side to side.  Movement of the head and opening of the mouth when the corner of the mouth or cheek is stroked (rooting).  Increased sucking sounds, smacking lips, cooing, sighing, or squeaking.  Hand-to-mouth movements.  Increased sucking of fingers or hands.  Fussing.  Intermittent crying.  Signs of extreme hunger will require calming and consoling before you try to feed your baby. Signs of extreme hunger may include:  Restlessness.  A loud, strong cry.  Screaming.  Frequent feeding will help you make more milk and will help prevent problems, such as sore nipples and engorgement of the breasts.  BREASTFEEDING   Whether lying down or sitting, be sure that the baby's abdomen is facing your abdomen.   Support your breast with 4 fingers under your breast and your thumb above your nipple. Make sure your fingers are well away from your nipple and your baby's mouth.   Stroke your baby's lips gently with your finger or nipple.   When your baby's mouth is open wide enough, place all of your nipple and as much of the colored area around your nipple (areola) as possible into your baby's mouth.  More areola should be visible above his or her upper lip than below his or her lower lip.  Your baby's tongue should be between his or her lower gum and your breast.  Ensure that your baby's mouth is correctly positioned around the nipple (latched). Your baby's lips should create a seal on your breast.  Signs that your baby has effectively latched onto your nipple include:  Tugging or sucking without pain.  Swallowing heard  between sucks.  Absent click or smacking sound.  Muscle movement above and in front of his or her ears with sucking.  Your baby must suck about 2 3 minutes in order to get your milk. Allow your baby to feed on each breast as long as he or she wants. Nurse your baby until he or she unlatches or falls asleep at the first breast, then offer the second breast.  Signs that your baby is full and satisfied include:  A gradual decrease in the number of sucks or complete cessation of sucking.  Falling asleep.  Extension or relaxation of his or her body.  Retention of a small amount of milk in his or her mouth.  Letting go of your breast by himself or herself.  Signs of effective breastfeeding in you include:  Breasts that have increased firmness, weight, and size prior to feeding.  Breasts that are softer after nursing.  Increased milk volume, as well as a change in milk consistency and color by the 5th day of breastfeeding.  Breast fullness relieved by breastfeeding.  Nipples are not sore, cracked, or bleeding.  If needed, break the suction by putting your finger into the corner of your baby's mouth and sliding your finger between his or her gums. Then, remove your breast from his or her mouth.  It is common for babies to spit up a small amount after a feeding.  Babies often swallow air during feeding. This can make babies fussy. Burping your baby between breasts can help with this.  Vitamin D supplements are recommended for babies who get only breast milk.  Avoid using a pacifier during your baby's first 4 6 weeks.  Avoid supplemental feedings of water, formula, or   juice in place of breastfeeding. Breast milk is all the food your baby needs. It is not necessary for your baby to have water or formula. Your breasts will make more milk if supplemental feedings are avoided during the early weeks. HOW TO TELL WHETHER YOUR BABY IS GETTING ENOUGH BREAST MILK Wondering whether or not  your baby is getting enough milk is a common concern among mothers. You can be assured that your baby is getting enough milk if:   Your baby is actively sucking and you hear swallowing.   Your baby seems relaxed and satisfied after a feeding.   Your baby nurses at least 8 12 times in a 24 hour time period.  During the first 3 5 days of age:  Your baby is wetting at least 3 5 diapers in a 24 hour period. The urine should be clear and pale yellow.  Your baby is having at least 3 4 stools in a 24 hour period. The stool should be soft and yellow.  At 5 7 days of age, your baby is having at least 3 6 stools in a 24 hour period. The stool should be seedy and yellow by 5 days of age.  Your baby has a weight loss less than 7 10% during the first 3 days of age.  Your baby does not lose weight after 3 7 days of age.  Your baby gains 4 7 ounces each week after he or she is 4 days of age.  Your baby gains weight by 5 days of age and is back to birth weight within 2 weeks. ENGORGEMENT In the first week after your baby is born, you may experience extremely full breasts (engorgement). When engorged, your breasts may feel heavy, warm, or tender to the touch. Engorgement peaks within 24 48 hours after delivery of your baby.  Engorgement may be reduced by:  Continuing to breastfeed.  Increasing the frequency of breastfeeding.  Taking warm showers or applying warm, moist heat to your breasts just before each feeding. This increases circulation and helps the milk flow.   Gently massaging your breast before and during the feedings. With your fingertips, massage from your chest wall towards your nipple in a circular motion.   Ensuring that your baby empties at least one breast at every feeding. It also helps to start the next feeding on the opposite breast.   Expressing breast milk by hand or by using a breast pump to empty the breasts if your baby is sleepy, or not nursing well. You may also  want to express milk if you are returning to work oryou feel you are getting engorged.  Ensuring your baby is latched on and positioned properly while breastfeeding. If you follow these suggestions, your engorgement should improve in 24 48 hours. If you are still experiencing difficulty, call your lactation consultant or caregiver.  CARING FOR YOURSELF Take care of your breasts.  Bathe or shower daily.   Avoid using soap on your nipples.   Wear a supportive bra. Avoid wearing underwire style bras.  Air dry your nipples for a 3 4minutes after each feeding.   Use only cotton bra pads to absorb breast milk leakage. Leaking of breast milk between feedings is normal.   Use only pure lanolin on your nipples after nursing. You do not need to wash it off before feeding your baby again. Another option is to express a few drops of breast milk and gently massage that milk into your nipples.  Continue   breast self-awareness checks. Take care of yourself.  Eat healthy foods. Alternate 3 meals with 3 snacks.  Avoid foods that you notice affect your baby in a bad way.  Drink milk, fruit juice, and water to satisfy your thirst (about 8 glasses a day).   Rest often, relax, and take your prenatal vitamins to prevent fatigue, stress, and anemia.  Avoid chewing and smoking tobacco.  Avoid alcohol and drug use.  Take over-the-counter and prescribed medicine only as directed by your caregiver or pharmacist. You should always check with your caregiver or pharmacist before taking any new medicine, vitamin, or herbal supplement.  Know that pregnancy is possible while breastfeeding. If desired, talk to your caregiver about family planning and safe birth control methods that may be used while breastfeeding. SEEK MEDICAL CARE IF:   You feel like you want to stop breastfeeding or have become frustrated with breastfeeding.  You have painful breasts or nipples.  Your nipples are cracked or  bleeding.  Your breasts are red, tender, or warm.  You have a swollen area on either breast.  You have a fever or chills.  You have nausea or vomiting.  You have drainage from your nipples.  Your breasts do not become full before feedings by the 5th day after delivery.  You feel sad and depressed.  Your baby is too sleepy to eat well.  Your baby is having trouble sleeping.   Your baby is wetting less than 3 diapers in a 24 hour period.  Your baby has less than 3 stools in a 24 hour period.  Your baby's skin or the white part of his or her eyes becomes more yellow.   Your baby is not gaining weight by 5 days of age. MAKE SURE YOU:   Understand these instructions.  Will watch your condition.  Will get help right away if you are not doing well or get worse. Document Released: 05/29/2005 Document Revised: 02/21/2012 Document Reviewed: 01/03/2012 [employer] Patient Information 2014 [employer].  

## 2012-12-11 LAB — SICKLE CELL SCREEN: Sickle Cell Screen: NEGATIVE

## 2012-12-12 LAB — OBSTETRIC PANEL
Antibody Screen: NEGATIVE
Basophils Absolute: 0 10*3/uL (ref 0.0–0.1)
Eosinophils Absolute: 0.1 10*3/uL (ref 0.0–0.7)
Hemoglobin: 12.3 g/dL (ref 12.0–15.0)
Lymphs Abs: 1.9 10*3/uL (ref 0.7–4.0)
Monocytes Relative: 10 % (ref 3–12)
Neutro Abs: 2.8 10*3/uL (ref 1.7–7.7)
Neutrophils Relative %: 52 % (ref 43–77)
Platelets: 314 10*3/uL (ref 150–400)
RBC: 4.35 MIL/uL (ref 3.87–5.11)
Rubella: 4.87 Index — ABNORMAL HIGH (ref <0.90)

## 2012-12-12 LAB — CYSTIC FIBROSIS DIAGNOSTIC STUDY

## 2013-01-01 LAB — OB RESULTS CONSOLE GC/CHLAMYDIA
Chlamydia: NEGATIVE
GC PROBE AMP, GENITAL: NEGATIVE

## 2013-01-06 ENCOUNTER — Telehealth: Payer: Self-pay | Admitting: Hematology and Oncology

## 2013-01-06 NOTE — Telephone Encounter (Signed)
LM FOR PT IN RE TO NP APPT MOVED TO 07/30 @ 3.

## 2013-01-06 NOTE — Telephone Encounter (Signed)
PT RETURN CALL AND STATED THAT WED SHE WOULD NOT BE AVAILABLE FOR NP APPT. WILL CALL BACK AFTER LOOKING AT SCHEDULE.

## 2013-01-07 ENCOUNTER — Encounter: Payer: 59 | Admitting: Obstetrics & Gynecology

## 2013-01-07 ENCOUNTER — Telehealth: Payer: Self-pay

## 2013-01-07 NOTE — Telephone Encounter (Signed)
C/D 01/07/13 for appt. 01/16/13

## 2013-01-08 ENCOUNTER — Other Ambulatory Visit: Payer: 59 | Admitting: Lab

## 2013-01-08 ENCOUNTER — Ambulatory Visit: Payer: 59

## 2013-01-13 ENCOUNTER — Telehealth: Payer: Self-pay | Admitting: Hematology and Oncology

## 2013-01-13 NOTE — Telephone Encounter (Signed)
PT CALLED TO R/S APPT TO 08/18 @ 8 W/DR. VICTOR. APPT D/T CONFIRMED WITH PT.

## 2013-01-16 ENCOUNTER — Other Ambulatory Visit: Payer: 59 | Admitting: Lab

## 2013-01-16 ENCOUNTER — Ambulatory Visit: Payer: 59

## 2013-01-27 ENCOUNTER — Encounter: Payer: Self-pay | Admitting: Hematology and Oncology

## 2013-01-27 ENCOUNTER — Ambulatory Visit: Payer: 59

## 2013-01-27 ENCOUNTER — Ambulatory Visit (HOSPITAL_BASED_OUTPATIENT_CLINIC_OR_DEPARTMENT_OTHER): Payer: 59 | Admitting: Hematology and Oncology

## 2013-01-27 VITALS — BP 142/89 | HR 87 | Temp 97.0°F | Resp 20 | Ht 70.0 in | Wt 338.0 lb

## 2013-01-27 DIAGNOSIS — Z86711 Personal history of pulmonary embolism: Secondary | ICD-10-CM

## 2013-01-27 NOTE — Progress Notes (Signed)
Checked in new pt with no financial concerns. °

## 2013-02-20 NOTE — Progress Notes (Signed)
ID: Yvonne Ali OB: 1980-09-10  MR#: [id number]  CSN#:[id number]      INITIAL HEMATOLOGY CONSULTATION    Referral MD:  Serita Kyle, MD   Reason for Referral: History of pulmonary embolism.   HISTORY OF PRESENT ILLNESS: The patient is 32 y.o. pregnant female who was sent to clinic because of history of pulmonary embolism. Patient told that about 10 year ago when she smoked, took birth control pill and was overweight, she wake up with significant dyspnea. She went to ER where diagnosis of PE was established. Heparin was started and that Coumadin. She was on Coumadin for one year. She presented to our clinic today for the first time. Patient complains on be fatigue, occasional nausea, episode of headache one week ago. Her appetite is good.  Review of Systems  Constitutional: Negative for fever, chills, weight loss, malaise/fatigue and diaphoresis.  HENT: Negative for hearing loss, ear pain, nosebleeds, congestion, sore throat, neck pain, tinnitus and ear discharge.   Eyes: Positive for double vision. Negative for blurred vision, photophobia and pain.  Respiratory: Negative for cough, hemoptysis, sputum production, shortness of breath, wheezing and stridor.   Cardiovascular: Positive for leg swelling. Negative for chest pain, palpitations, orthopnea, claudication and PND.  Gastrointestinal: Positive for nausea. Negative for heartburn, vomiting, abdominal pain, diarrhea, constipation, blood in stool and melena.  Genitourinary: Negative for dysuria, urgency, frequency, hematuria and flank pain.  Musculoskeletal: Negative for myalgias, back pain, joint pain and falls.  Skin: Negative for itching and rash.  Neurological: Positive for headaches. Negative for dizziness, tingling, tremors, sensory change, speech change, focal weakness, seizures, loss of consciousness and weakness.  Endo/Heme/Allergies: Does not bruise/bleed easily.  Psychiatric/Behavioral: Negative.    PAST MEDICAL  HISTORY: Past Medical History  Diagnosis Date  . Ectopic pregnancy     PAST SURGICAL HISTORY: Past Surgical History  Procedure Laterality Date  . Hip surgery    . Bariatric surgery      gastric sleeve    FAMILY HISTORY Family History  Problem Relation Age of Onset  . Hypertension Maternal Grandfather   . Hyperlipidemia Maternal Grandfather   . Cancer Paternal Grandmother 70    ovarian cancer  . Diabetes Paternal Grandmother     HEALTH MAINTENANCE: History  Substance Use Topics  . Smoking status: Former Smoker    Quit date: 05/12/2006  . Smokeless tobacco: Never Used  . Alcohol Use: Yes     Comment: stopped with positive UPT   No Known Allergies  Current Outpatient Prescriptions  Medication Sig Dispense Refill  . enoxaparin (LOVENOX) 40 MG/0.4ML injection Inject 0.4 mLs (40 mg total) into the skin daily.  30 Syringe  12  . Prenatal Vit-Fe Fumarate-FA (MULTIVITAMIN-PRENATAL) 27-0.8 MG TABS Take 1 tablet by mouth daily at 12 noon.       No current facility-administered medications for this visit.    OBJECTIVE: Filed Vitals:   01/27/13 0819  BP: 142/89  Pulse: 87  Temp: 97 F (36.1 C)  Resp: 20     Body mass index is 48.5 kg/(m^2).    ECOG FS:0  Ocular: Sclerae anicteric, pupils equal, round and reactive to light Ear-nose-throat: Oropharynx clear, dentition fair Lymphatic: No cervical or supraclavicular adenopathy Lungs no rales or rhonchi, good excursion bilaterally Heart regular rate and rhythm, no murmur appreciated Abd soft, nontender, positive bowel sounds MSK no focal spinal tenderness, no joint edema Neuro: non-focal, well-oriented, appropriate affect Breasts:    LAB RESULTS:  CMP     Component Value Date/Time  NA 140 09/03/2012 0956   K 4.0 09/03/2012 0956   CL 106 09/03/2012 0956   CO2 25 09/03/2012 0956   GLUCOSE 78 09/03/2012 0956   BUN 11 09/03/2012 0956   CREATININE 0.82 09/03/2012 0956   CALCIUM 8.7 09/03/2012 0956   PROT 6.8 09/03/2012  0956   ALBUMIN 3.7 09/03/2012 0956   AST 18 09/03/2012 0956   ALT 14 09/03/2012 0956   ALKPHOS 54 09/03/2012 0956   BILITOT 0.3 09/03/2012 0956    Lab Results  Component Value Date   WBC 5.4 12/10/2012   NEUTROABS 2.8 12/10/2012   HGB 12.3 12/10/2012   HCT 37.9 12/10/2012   MCV 87.1 12/10/2012   PLT 314 12/10/2012      Chemistry      Component Value Date/Time   NA 140 09/03/2012 0956   K 4.0 09/03/2012 0956   CL 106 09/03/2012 0956   CO2 25 09/03/2012 0956   BUN 11 09/03/2012 0956   CREATININE 0.82 09/03/2012 0956      Component Value Date/Time   CALCIUM 8.7 09/03/2012 0956   ALKPHOS 54 09/03/2012 0956   AST 18 09/03/2012 0956   ALT 14 09/03/2012 0956   BILITOT 0.3 09/03/2012 0956      STUDIES: No results found.  ASSESSMENT/PLAN:  History of estrogen related pulmonary embolism. Current recommendation is to use  prophylactic or intermediate dose of LMWH for antepartum prophylaxis.. This is what I recommended. Follow up at time of delivery.  Myra Rude, MD   01/27/2013 10:26 PM

## 2013-02-21 ENCOUNTER — Telehealth: Payer: Self-pay | Admitting: Hematology and Oncology

## 2013-02-21 NOTE — Telephone Encounter (Signed)
Per 9/11 pof the pt is to schedule her f/u appt at the time of her delivery. The pt is pregnant.

## 2013-06-12 NOTE — L&D Delivery Note (Signed)
Delivery Note At 6:35 AM a viable and healthy female was delivered via Vaginal, Spontaneous Delivery (Presentation: ; Occiput Anterior).  APGAR: 7, 9; weight .  pending Placenta status: Intact, Spontaneous.  Not sentCord:  with the following complications: None.  Cord pH: none  Anesthesia: Epidural  Episiotomy: None Lacerations: Vaginal; left Sulcus;Labial Suture Repair: 3.0 chromic Est. Blood Loss (mL): 400  Mom to postpartum.  Baby to Couplet care / Skin to Skin.  Jahlen Bollman A 07/15/2013, 7:39 AM

## 2013-06-23 LAB — OB RESULTS CONSOLE GBS: GBS: POSITIVE

## 2013-06-25 ENCOUNTER — Telehealth: Payer: Self-pay | Admitting: Hematology and Oncology

## 2013-06-25 NOTE — Telephone Encounter (Signed)
C/D 06/25/13 for appt. 06/27/13

## 2013-06-25 NOTE — Telephone Encounter (Signed)
S/W PT AND GVE APPT 01/16 @ 8:30 W/DR. GORSUCH. PT IS ESTABLISHED SAW DR. VICTOR REC'D ADDITIONAL INFORMATION FROM DR. COUSINS OFFICE.

## 2013-06-26 ENCOUNTER — Ambulatory Visit: Payer: 59

## 2013-06-27 ENCOUNTER — Encounter: Payer: Self-pay | Admitting: *Deleted

## 2013-06-27 ENCOUNTER — Ambulatory Visit: Payer: 59 | Admitting: Hematology and Oncology

## 2013-06-27 ENCOUNTER — Ambulatory Visit (HOSPITAL_BASED_OUTPATIENT_CLINIC_OR_DEPARTMENT_OTHER): Payer: 59 | Admitting: Hematology and Oncology

## 2013-06-27 VITALS — BP 135/99 | HR 115 | Temp 97.7°F | Resp 20 | Ht 70.0 in | Wt 360.8 lb

## 2013-06-27 DIAGNOSIS — Z86711 Personal history of pulmonary embolism: Secondary | ICD-10-CM

## 2013-06-27 DIAGNOSIS — O9989 Other specified diseases and conditions complicating pregnancy, childbirth and the puerperium: Secondary | ICD-10-CM

## 2013-06-27 DIAGNOSIS — O99891 Other specified diseases and conditions complicating pregnancy: Secondary | ICD-10-CM

## 2013-06-28 NOTE — Progress Notes (Signed)
[facility] OFFICE PROGRESS NOTE  COUSINS,SHERONETTE A, MD DIAGNOSIS:  History of PE, currently [redacted] weeks pregnant  SUMMARY OF HEMATOLOGIC HISTORY: This is a pleasant 33 year-old lady who was diagnosed with PE approximately 10 years ago. At that time the patient was smoking and using birth control pill. She was diagnosed in new [place]. According to the patient, she was anticoagulated with heparin and then transitioned to Coumadin. She was on Coumadin for approximately 1 year with no bleeding complications. Recently, she became pregnant. She was seen by another physician several months ago who recommended 80 mg of Lovenox once a day as a prophylaxis against risk of recurrent thrombosis during pregnancy. INTERVAL HISTORY: Breely Panik 33 y.o. female returns for further followup. The patient self administer Lovenox usually in her thighs The patient denies any recent signs or symptoms of bleeding such as spontaneous epistaxis, hematuria or hematochezia. According to notes from her obstetrician, the patient has fetal macrosomia and a planned induction for the near future was recommended. She was specifically referred back here to be transitioned to unfractionated heparin  I have reviewed the past medical history, past surgical history, social history and family history with the patient and they are unchanged from previous note.  ALLERGIES:  has No Known Allergies.  MEDICATIONS:  Current Outpatient Prescriptions  Medication Sig Dispense Refill  . enoxaparin (LOVENOX) 40 MG/0.4ML injection Inject 0.4 mLs (40 mg total) into the skin daily.  30 Syringe  12  . Prenatal Vit-Fe Fumarate-FA (MULTIVITAMIN-PRENATAL) 27-0.8 MG TABS Take 1 tablet by mouth daily at 12 noon.       No current facility-administered medications for this visit.     REVIEW OF SYSTEMS:   Constitutional: Denies fevers, chills or night sweats Eyes: Denies blurriness of vision Ears, nose, mouth, throat, and face:  Denies mucositis or sore throat Respiratory: Denies cough, dyspnea or wheezes Cardiovascular: Denies palpitation, chest discomfort or lower extremity swelling Gastrointestinal:  Denies nausea, heartburn or change in bowel habits Skin: Denies abnormal skin rashes Lymphatics: Denies new lymphadenopathy or easy bruising Neurological:Denies numbness, tingling or new weaknesses Behavioral/Psych: Mood is stable, no new changes  All other systems were reviewed with the patient and are negative.  PHYSICAL EXAMINATION: ECOG PERFORMANCE STATUS: 0 - Asymptomatic  Filed Vitals:   06/27/13 0838  BP: 135/99  Pulse: 115  Temp: 97.7 F (36.5 C)  Resp: 20   Filed Weights   06/27/13 0838  Weight: 360 lb 12.8 oz (163.658 kg)    GENERAL:alert, no distress and comfortable SKIN: skin color, texture, turgor are normal, no rashes or significant lesions EYES: normal, Conjunctiva are pink and non-injected, sclera clear OROPHARYNX:no exudate, no erythema and lips, buccal mucosa, and tongue normal  NECK: supple, thyroid normal size, non-tender, without nodularity LYMPH:  no palpable lymphadenopathy in the cervical, axillary or inguinal LUNGS: clear to auscultation and percussion with normal breathing effort HEART: regular rate & rhythm and no murmurs and no lower extremity edema ABDOMEN:abdomen soft, non-tender and normal bowel sounds Musculoskeletal:no cyanosis of digits and no clubbing  NEURO: alert & oriented x 3 with fluent speech, no focal motor/sensory deficits  LABORATORY DATA:  I have reviewed the data as listed No results found for this or any previous visit (from the past 48 hour(s)).  Lab Results  Component Value Date   WBC 5.4 12/10/2012   HGB 12.3 12/10/2012   HCT 37.9 12/10/2012   MCV 87.1 12/10/2012   PLT 314 12/10/2012   ASSESSMENT & PLAN:  #  1 history of PE #2 pregnancy I have reviewed the current guidelines and also discussed this case with my colleagues I spoke with the obstetrician  team that I could not recommend switching her from Lovenox to unfractionated heparin I recommend the patient to stop Lovenox 24 hours before planned induction and to resume after delivery within 24-48 hours if there is no significant bleeding complications at current dose of Lovenox for 4-6 weeks to prevent DVT Plan of care as discussed with the patient as well All questions were answered. The patient knows to call the clinic with any problems, questions or concerns. No barriers to learning was detected.  I spent 40 minutes counseling the patient face to face. The total time spent in the appointment was 60 minutes and more than 50% was on counseling.     Mathhew Buysse, MD 06/28/2013 7:54 AM

## 2013-06-30 ENCOUNTER — Telehealth: Payer: Self-pay | Admitting: *Deleted

## 2013-06-30 NOTE — Telephone Encounter (Signed)
Pt states that Dr. Cherly Hensenousins has not got recommendations from Dr. Bertis RuddyGorsuch yet.  Informed pt that Dr. Bertis RuddyGorsuch indicated in her office note that she spoke with "Obstetrician team" and I will fax note over to Dr. Cherly Hensenousins which outlines her recommendations.  Pt verbalized understanding.    Office note from 06/27/13 faxed to Dr. Cherly Hensenousins at fax [phone].

## 2013-07-02 ENCOUNTER — Observation Stay (HOSPITAL_COMMUNITY)
Admission: AD | Admit: 2013-07-02 | Discharge: 2013-07-03 | Disposition: A | Discharge status: Home / Self Care | Payer: 59 | Source: Ambulatory Visit | Attending: Obstetrics and Gynecology | Admitting: Obstetrics and Gynecology

## 2013-07-02 ENCOUNTER — Inpatient Hospital Stay (HOSPITAL_COMMUNITY): Payer: 59

## 2013-07-02 ENCOUNTER — Encounter (HOSPITAL_COMMUNITY): Payer: Self-pay | Admitting: *Deleted

## 2013-07-02 DIAGNOSIS — E669 Obesity, unspecified: Secondary | ICD-10-CM | POA: Insufficient documentation

## 2013-07-02 DIAGNOSIS — O139 Gestational [pregnancy-induced] hypertension without significant proteinuria, unspecified trimester: Principal | ICD-10-CM | POA: Diagnosis present

## 2013-07-02 DIAGNOSIS — Z86711 Personal history of pulmonary embolism: Secondary | ICD-10-CM | POA: Insufficient documentation

## 2013-07-02 DIAGNOSIS — Z87891 Personal history of nicotine dependence: Secondary | ICD-10-CM | POA: Insufficient documentation

## 2013-07-02 DIAGNOSIS — O0991 Supervision of high risk pregnancy, unspecified, first trimester: Secondary | ICD-10-CM

## 2013-07-02 DIAGNOSIS — R12 Heartburn: Secondary | ICD-10-CM | POA: Insufficient documentation

## 2013-07-02 DIAGNOSIS — O99210 Obesity complicating pregnancy, unspecified trimester: Secondary | ICD-10-CM

## 2013-07-02 HISTORY — DX: Other pulmonary embolism without acute cor pulmonale: I26.99

## 2013-07-02 HISTORY — DX: Gestational (pregnancy-induced) hypertension without significant proteinuria, unspecified trimester: O13.9

## 2013-07-02 LAB — CBC
HCT: 35.5 % — ABNORMAL LOW (ref 36.0–46.0)
HEMATOCRIT: 32.3 % — AB (ref 36.0–46.0)
Hemoglobin: 11.9 g/dL — ABNORMAL LOW (ref 12.0–15.0)
Hemoglobin: 10.9 g/dL — ABNORMAL LOW (ref 12.0–15.0)
MCH: 28 pg (ref 26.0–34.0)
MCH: 28.3 pg (ref 26.0–34.0)
MCHC: 33.5 g/dL (ref 30.0–36.0)
MCHC: 33.7 g/dL (ref 30.0–36.0)
MCV: 83.5 fL (ref 78.0–100.0)
MCV: 83.9 fL (ref 78.0–100.0)
PLATELETS: 217 10*3/uL (ref 150–400)
Platelets: 219 10*3/uL (ref 150–400)
RBC: 4.25 MIL/uL (ref 3.87–5.11)
RBC: 3.85 MIL/uL — AB (ref 3.87–5.11)
RDW: 14.3 % (ref 11.5–15.5)
RDW: 14.3 % (ref 11.5–15.5)
WBC: 8.3 10*3/uL (ref 4.0–10.5)
WBC: 8.1 10*3/uL (ref 4.0–10.5)

## 2013-07-02 LAB — TYPE AND SCREEN
ABO/RH(D): B POS
ANTIBODY SCREEN: NEGATIVE

## 2013-07-02 LAB — COMPREHENSIVE METABOLIC PANEL
ALBUMIN: 2.7 g/dL — AB (ref 3.5–5.2)
ALK PHOS: 95 U/L (ref 39–117)
ALT: 21 U/L (ref 0–35)
ALT: 19 U/L (ref 0–35)
AST: 21 U/L (ref 0–37)
AST: 20 U/L (ref 0–37)
Albumin: 2.4 g/dL — ABNORMAL LOW (ref 3.5–5.2)
Alkaline Phosphatase: 83 U/L (ref 39–117)
BILIRUBIN TOTAL: 0.3 mg/dL (ref 0.3–1.2)
BILIRUBIN TOTAL: 0.3 mg/dL (ref 0.3–1.2)
BUN: 14 mg/dL (ref 6–23)
BUN: 11 mg/dL (ref 6–23)
CALCIUM: 9.4 mg/dL (ref 8.4–10.5)
CHLORIDE: 104 meq/L (ref 96–112)
CHLORIDE: 103 meq/L (ref 96–112)
CO2: 20 meq/L (ref 19–32)
CO2: 21 meq/L (ref 19–32)
CREATININE: 0.92 mg/dL (ref 0.50–1.10)
Calcium: 9.8 mg/dL (ref 8.4–10.5)
Creatinine, Ser: 0.83 mg/dL (ref 0.50–1.10)
GFR calc Af Amer: >90 mL/min (ref >90)
GFR calc Af Amer: >90 mL/min (ref >90)
GFR, EST NON AFRICAN AMERICAN: 83 mL/min — AB (ref >90)
GLUCOSE: 98 mg/dL (ref 70–99)
Glucose, Bld: 98 mg/dL (ref 70–99)
POTASSIUM: 4 meq/L (ref 3.7–5.3)
Potassium: 3.8 mEq/L (ref 3.7–5.3)
SODIUM: 139 meq/L (ref 137–147)
Sodium: 136 mEq/L — ABNORMAL LOW (ref 137–147)
Total Protein: 6.7 g/dL (ref 6.0–8.3)
Total Protein: 6.4 g/dL (ref 6.0–8.3)

## 2013-07-02 LAB — URINALYSIS, ROUTINE W REFLEX MICROSCOPIC
Bilirubin Urine: NEGATIVE
Glucose, UA: NEGATIVE mg/dL
HGB URINE DIPSTICK: NEGATIVE
Ketones, ur: 15 mg/dL — AB
LEUKOCYTES UA: NEGATIVE
Nitrite: NEGATIVE
PROTEIN: NEGATIVE mg/dL
SPECIFIC GRAVITY, URINE: 1.025 (ref 1.005–1.030)
UROBILINOGEN UA: 0.2 mg/dL (ref 0.0–1.0)
pH: 6 (ref 5.0–8.0)

## 2013-07-02 LAB — URIC ACID
Uric Acid, Serum: 6 mg/dL (ref 2.4–7.0)
Uric Acid, Serum: 5.8 mg/dL (ref 2.4–7.0)

## 2013-07-02 LAB — ABO/RH: ABO/RH(D): B POS

## 2013-07-02 MED ORDER — ACETAMINOPHEN 325 MG PO TABS: 650.0000 mg | ORAL_TABLET | ORAL | Status: DC | PRN

## 2013-07-02 MED ORDER — ZOLPIDEM TARTRATE 5 MG PO TABS: 5.0000 mg | ORAL_TABLET | Freq: Every evening | ORAL | Status: DC | PRN

## 2013-07-02 MED ORDER — CALCIUM CARBONATE ANTACID 500 MG PO CHEW
2.0000 | CHEWABLE_TABLET | ORAL | Status: DC | PRN
  Administered 2013-07-02: 400 mg via ORAL
  Filled 2013-07-02: qty 1

## 2013-07-02 MED ORDER — ENOXAPARIN SODIUM 40 MG/0.4ML ~~LOC~~ SOLN: 40.0000 mg | SUBCUTANEOUS | Status: DC

## 2013-07-02 MED ORDER — SODIUM CHLORIDE 0.9 % IJ SOLN
3.0000 mL | Freq: Two times a day (BID) | INTRAMUSCULAR | Status: DC
  Administered 2013-07-02: 3 mL via INTRAVENOUS

## 2013-07-02 MED ORDER — ACETAMINOPHEN 500 MG PO TABS: 1000.0000 mg | ORAL_TABLET | Freq: Four times a day (QID) | ORAL | Status: DC | PRN

## 2013-07-02 MED ORDER — LACTATED RINGERS IV SOLN
INTRAVENOUS | Status: DC
  Administered 2013-07-02 (×2): via INTRAVENOUS

## 2013-07-02 MED ORDER — LABETALOL HCL 5 MG/ML IV SOLN
20.0000 mg | Freq: Once | INTRAVENOUS | Status: DC
  Filled 2013-07-02: qty 4

## 2013-07-02 MED ORDER — DOCUSATE SODIUM 100 MG PO CAPS
100.0000 mg | ORAL_CAPSULE | Freq: Every day | ORAL | Status: DC
  Administered 2013-07-02 – 2013-07-03 (×2): 100 mg via ORAL
  Filled 2013-07-02 (×2): qty 1

## 2013-07-02 MED ORDER — LABETALOL HCL 100 MG PO TABS
100.0000 mg | ORAL_TABLET | Freq: Two times a day (BID) | ORAL | Status: DC
  Administered 2013-07-02 – 2013-07-03 (×4): 100 mg via ORAL
  Filled 2013-07-02 (×5): qty 1

## 2013-07-02 MED ORDER — PANTOPRAZOLE SODIUM 40 MG PO TBEC
40.0000 mg | DELAYED_RELEASE_TABLET | Freq: Every day | ORAL | Status: DC
  Administered 2013-07-02 – 2013-07-03 (×2): 40 mg via ORAL
  Filled 2013-07-02 (×3): qty 1

## 2013-07-02 MED ORDER — PRENATAL MULTIVITAMIN CH
1.0000 | ORAL_TABLET | Freq: Every day | ORAL | Status: DC
  Administered 2013-07-02 – 2013-07-03 (×2): 1 via ORAL
  Filled 2013-07-02 (×2): qty 1

## 2013-07-02 NOTE — H&P (Signed)
Yvonne SmallingJanine Ali is a 33 y.o. female presenting @ 37 2/[redacted] weeks gestation for admission due to elevated BP. Pt c/o leg swelling and h/a. (+) heartburn which is not new, using tums. PNC notable for anticoagulation due to hx PE( 2003), morbid obesity and findings of some elevated BP from 28 weeks. Pt denies blurry vision   History OB History   Grav Para Term Preterm Abortions TAB SAB Ect Mult Living   2    1   1        Past Medical History  Diagnosis Date  . Ectopic pregnancy   . Pregnancy induced hypertension   . PE (pulmonary embolism)    Past Surgical History  Procedure Laterality Date  . Hip surgery    . Bariatric surgery      gastric sleeve   Family History: family history includes Cancer (age of onset: 369) in her paternal grandmother; Diabetes in her paternal grandmother; Hyperlipidemia in her maternal grandfather; Hypertension in her maternal grandfather. Social History:  reports that she quit smoking about 7 years ago. She has never used smokeless tobacco. She reports that she drinks alcohol. She reports that she does not use illicit drugs.   Prenatal Transfer Tool  Maternal Diabetes: No Genetic Screening: Normal Maternal Ultrasounds/Referrals: Normal Fetal Ultrasounds or other Referrals:  None Maternal Substance Abuse:  No Significant Maternal Medications:  Meds include: Other: LOVENOX Significant Maternal Lab Results:  Lab values include: Other: gbs CX PENDING Other Comments:  MORBID OBESITY  Review of Systems  Eyes: Negative for blurred vision.  Gastrointestinal: Positive for heartburn.  Musculoskeletal:       LEG SWELLING  All other systems reviewed and are negative.    Dilation: 1 Effacement (%): 60 Station: -3 Exam by:: Dr Cherly Hensenousins Blood pressure 143/88, pulse 111, temperature 97.8 F (36.6 C), temperature source Oral, resp. rate 20, height 5\' 8"  (1.727 m), weight 164.883 kg (363 lb 8 oz), last menstrual period 10/17/2012. Exam Physical Exam  Constitutional:  She is oriented to person, place, and time. She appears well-developed and well-nourished.  Morbid obesity  HENT:  Head: Atraumatic.  Eyes: EOM are normal.  Neck: Neck supple.  Cardiovascular: Regular rhythm.   Respiratory: Effort normal and breath sounds normal.  GI: Soft.  Large pannus  Genitourinary:  1/60/-3  Musculoskeletal: She exhibits edema.  DTR 1+ no clonus (+) 1 edema  Neurological: She is alert and oriented to person, place, and time.    Prenatal labs: ABO, Rh: B/POS/-- (07/01 1015) Antibody: NEG (07/01 1015) Rubella: 4.87 (07/01 1015) RPR: NON REAC (07/01 1015)  HBsAg: NEGATIVE (07/01 1015)  HIV: NON REACTIVE (07/01 1015)  GBS:   pending  Assessment/Plan: PIH r/o worsening HTN vs superimposed preeclampsia IUP @ 37 2/7 weeks Morbid obesity Fetal macrosomia Hx PE on lovenox( last dose 6 pm yesterday) Hx bariatric surgery  P) admit. Start 24 hr urine collection. Rpt PIH labs. Start labetalol. IV labetalol prn Heparin to allow for conversion if needed. sono for efw to assess route of delivery. Magnesium prn continuous fetal monitoring. SCD stockings  Farah Benish A 07/02/2013, 7:10 AM

## 2013-07-02 NOTE — MAU Note (Signed)
Pt states she was having additional swelling in her feet and legs that made her take her Blood Pressure

## 2013-07-02 NOTE — MAU Note (Addendum)
PT SAYS SHE  TAKES HER BP AT  HOME  -  150/112,    162/113   ,154/102 ,   146/102.   SHE HAS  H/A - STARTED AT 9PM-   SHE TOOK TYLENOL,-  HELPED SOME..  PT CALLED DR COUSINS-  DIDN'T CALL BACK..  DENIES BLURRED VISION OR CHEST PAIN.  DOES HAVE HEARTBURN.  DENIES HSV AND MRSA.  FEELS SOME UC'S.      VE IN OFFICE ON 12TH-   CLOSED.   ALSO SAYS  HER FEET ARE SWOLLEN BUT USUSALLY GOES DOWN- THIS TIME DID  NOT.

## 2013-07-02 NOTE — MAU Provider Note (Signed)
History     CSN: [id number]  Arrival date and time: 07/02/13 0154 Provider notified: 0252 Orders entered after review of office records: 0307 Provider on unit: 0440 Provider at bedside: [phone]   Chief Complaint  Patient presents with  . Hypertension   HPI  [name] is a 33 yo G2P0010 at 37.2 wks presenting this morning for headache and high blood pressures  since about 8 or 9pm.  Her blood pressures have been stable in the office with the exception of a few.  She last had normal PIH labs in November 2013.    Past Medical History  Diagnosis Date  . Ectopic pregnancy   . Pregnancy induced hypertension   . PE (pulmonary embolism)     Past Surgical History  Procedure Laterality Date  . Hip surgery    . Bariatric surgery      gastric sleeve    Family History  Problem Relation Age of Onset  . Hypertension Maternal Grandfather   . Hyperlipidemia Maternal Grandfather   . Cancer Paternal Grandmother 49    ovarian cancer  . Diabetes Paternal Grandmother     History  Substance Use Topics  . Smoking status: Former Smoker    Quit date: 05/12/2006  . Smokeless tobacco: Never Used  . Alcohol Use: Yes     Comment: stopped with positive UPT    Allergies: Not on File  Prescriptions prior to admission  Medication Sig Dispense Refill  . enoxaparin (LOVENOX) 40 MG/0.4ML injection Inject 0.4 mLs (40 mg total) into the skin daily.  30 Syringe  12  . Prenatal Vit-Fe Fumarate-FA (MULTIVITAMIN-PRENATAL) 27-0.8 MG TABS Take 1 tablet by mouth daily at 12 noon.        Review of Systems  Constitutional: Negative.   HENT:       Mild H/A since about 8-9pm; no blurry vision  Eyes: Negative.   Respiratory: Negative.   Cardiovascular: Negative.   Gastrointestinal: Negative.   Genitourinary: Negative.   Musculoskeletal: Negative.   Skin: Negative.   Neurological: Positive for headaches.  Endo/Heme/Allergies: Negative.   Psychiatric/Behavioral: Negative.   (+)  FM  Results for orders placed during the hospital encounter of 07/02/13 (from the past 24 hour(s))  CBC     Status: Abnormal   Collection Time    07/02/13  3:35 AM      Result Value Range   WBC 8.3  4.0 - 10.5 K/uL   RBC 4.25  3.87 - 5.11 MIL/uL   Hemoglobin 11.9 (*) 12.0 - 15.0 g/dL   HCT 78.2 (*) 95.6 - 21.3 %   MCV 83.5  78.0 - 100.0 fL   MCH 28.0  26.0 - 34.0 pg   MCHC 33.5  30.0 - 36.0 g/dL   RDW 08.6  57.8 - 46.9 %   Platelets 217  150 - 400 K/uL  COMPREHENSIVE METABOLIC PANEL     Status: Abnormal   Collection Time    07/02/13  3:35 AM      Result Value Range   Sodium 139  137 - 147 mEq/L   Potassium 4.0  3.7 - 5.3 mEq/L   Chloride 104  96 - 112 mEq/L   CO2 20  19 - 32 mEq/L   Glucose, Bld 98  70 - 99 mg/dL   BUN 14  6 - 23 mg/dL   Creatinine, Ser 6.29  0.50 - 1.10 mg/dL   Calcium 9.8  8.4 - 52.8 mg/dL   Total Protein 6.7  6.0 -  8.3 g/dL   Albumin 2.7 (*) 3.5 - 5.2 g/dL   AST 21  0 - 37 U/L   ALT 21  0 - 35 U/L   Alkaline Phosphatase 95  39 - 117 U/L   Total Bilirubin 0.3  0.3 - 1.2 mg/dL   GFR calc non Af Amer 83 (*) >90 mL/min   GFR calc Af Amer >90  >90 mL/min  URIC ACID     Status: None   Collection Time    07/02/13  3:35 AM      Result Value Range   Uric Acid, Serum 6.0  2.4 - 7.0 mg/dL  URINALYSIS, ROUTINE W REFLEX MICROSCOPIC     Status: Abnormal   Collection Time    07/02/13  3:40 AM      Result Value Range   Color, Urine YELLOW  YELLOW   APPearance CLEAR  CLEAR   Specific Gravity, Urine 1.025  1.005 - 1.030   pH 6.0  5.0 - 8.0   Glucose, UA NEGATIVE  NEGATIVE mg/dL   Hgb urine dipstick NEGATIVE  NEGATIVE   Bilirubin Urine NEGATIVE  NEGATIVE   Ketones, ur 15 (*) NEGATIVE mg/dL   Protein, ur NEGATIVE  NEGATIVE mg/dL   Urobilinogen, UA 0.2  0.0 - 1.0 mg/dL   Nitrite NEGATIVE  NEGATIVE   Leukocytes, UA NEGATIVE  NEGATIVE   Physical Exam   Blood pressure 155/92, pulse 117, temperature 98 F (36.7 C), temperature source Oral, resp. rate 20, height  5\' 8"  (1.727 m), weight 164.883 kg (363 lb 8 oz), last menstrual period 10/17/2012. BP: 156/103, 148/72, 137/109, 150/65, 141/65, 140/81, 155/92, 156/94, 164/104  Physical Exam  Constitutional: She is oriented to person, place, and time. She appears well-developed and well-nourished.  HENT:  Head: Normocephalic and atraumatic.  Eyes: Pupils are equal, round, and reactive to light.  Neck: Normal range of motion. Neck supple.  Cardiovascular: Normal rate, regular rhythm, normal heart sounds and intact distal pulses.   Respiratory: Effort normal and breath sounds normal.  GI: Soft. Bowel sounds are normal.  Genitourinary:  Gravid uterus; pelvic exam deferred  Musculoskeletal: Normal range of motion.  1+ edema BLE  Neurological: She is alert and oriented to person, place, and time. She has normal reflexes.  No clonus  Skin: Skin is warm and dry.  Psychiatric: She has a normal mood and affect. Her behavior is normal. Judgment and thought content normal.    MAU Course  Procedures CCUA CBC CMET Uric Acid EFM  Assessment and Plan  32 yo G2P0010 @ 37.[redacted]wks Gestational Hypertension Normal PIH labs Morbid Obesity H/O Pulmonary Embolism - stable on Lovenox Category 1 FHR - baseline 140  Admit to Antenatal Unit  Routine Antenatal orders Labetalol 20mg  IV now Labetalol 100 mg po BID  *Dr. Cherly Hensenousins consulted on plan of care / admission orders - further orders to be entered by Dr. Georgeann Oppenheimousins  DAWSON, ROLITTA, M, MSN, CNM 07/02/2013, 4:42 AM

## 2013-07-02 NOTE — Progress Notes (Signed)
S: (+) heartburn  Not responsive to tums  O: labs reviewed; stable. plt 136K. 24 hr urine collection in prog Sono: 8lb 5 oz nl AFI  A/P: PIH on labetalol 2) hx PE P) cont BP med. Await urine collection result. Start protonix. Change to regular diet. Heparin @ 6 pm

## 2013-07-03 LAB — CREATININE CLEARANCE, URINE, 24 HOUR
COLLECTION INTERVAL-CRCL: 24 h
CREAT CLEAR: 144 mL/min — AB (ref 75–115)
CREATININE 24H UR: 1852 mg/d — AB (ref 700–1800)
CREATININE: 0.89 mg/dL (ref 0.50–1.10)
Creatinine, Urine: 168.32 mg/dL
Urine Total Volume-CRCL: 1100 mL

## 2013-07-03 LAB — PROTEIN, URINE, 24 HOUR
Collection Interval-UPROT: 24 hours
PROTEIN, URINE: 9 mg/dL
Protein, 24H Urine: 99 mg/d (ref 50–100)
Urine Total Volume-UPROT: 1100 mL

## 2013-07-03 LAB — COMPREHENSIVE METABOLIC PANEL
ALBUMIN: 2.2 g/dL — AB (ref 3.5–5.2)
ALT: 20 U/L (ref 0–35)
AST: 20 U/L (ref 0–37)
Alkaline Phosphatase: 81 U/L (ref 39–117)
BUN: 9 mg/dL (ref 6–23)
CO2: 21 mEq/L (ref 19–32)
Calcium: 8.8 mg/dL (ref 8.4–10.5)
Chloride: 103 mEq/L (ref 96–112)
Creatinine, Ser: 0.89 mg/dL (ref 0.50–1.10)
GFR calc non Af Amer: 86 mL/min — ABNORMAL LOW (ref >90)
GLUCOSE: 119 mg/dL — AB (ref 70–99)
POTASSIUM: 4.1 meq/L (ref 3.7–5.3)
Sodium: 137 mEq/L (ref 137–147)
TOTAL PROTEIN: 6.3 g/dL (ref 6.0–8.3)
Total Bilirubin: 0.2 mg/dL — ABNORMAL LOW (ref 0.3–1.2)

## 2013-07-03 LAB — CBC
HEMATOCRIT: 31.5 % — AB (ref 36.0–46.0)
Hemoglobin: 10.4 g/dL — ABNORMAL LOW (ref 12.0–15.0)
MCH: 27.8 pg (ref 26.0–34.0)
MCHC: 33 g/dL (ref 30.0–36.0)
MCV: 84.2 fL (ref 78.0–100.0)
Platelets: 215 10*3/uL (ref 150–400)
RBC: 3.74 MIL/uL — AB (ref 3.87–5.11)
RDW: 14.4 % (ref 11.5–15.5)
WBC: 7 10*3/uL (ref 4.0–10.5)

## 2013-07-03 LAB — URIC ACID: Uric Acid, Serum: 5.9 mg/dL (ref 2.4–7.0)

## 2013-07-03 MED ORDER — LABETALOL HCL 100 MG PO TABS: 100.0000 mg | ORAL_TABLET | Freq: Two times a day (BID) | ORAL | Status: DC

## 2013-07-03 MED ORDER — PANTOPRAZOLE SODIUM 40 MG PO TBEC
40.0000 mg | DELAYED_RELEASE_TABLET | Freq: Every day | ORAL | Status: DC
Start: 2013-07-03 — End: 2014-07-10

## 2013-07-03 NOTE — Discharge Summary (Signed)
Obstetric Discharge Summary Reason for Admission: Elevated BP r/o superimposed preeclampsia vs worsening chronic HTN Prenatal Procedures: ultrasound Intrapartum Procedures: NST. labetalol Postpartum Procedures: n/a Complications-Operative and Postpartum: n/a Hemoglobin  Date Value Range Status  07/03/2013 10.4* 12.0 - 15.0 g/dL Final     HCT  Date Value Range Status  07/03/2013 31.5* 36.0 - 46.0 % Final    Physical Exam:  General: alert, cooperative and no distress Lungs clear to A Cor  RRR Abd: morbid obesity, soft gravid Pelvic deferred Extr 1+ edema or calf tenderness IDVT Evaluation: No evidence of DVT seen on physical exam.  24hr UTP 99 mg  Hospital course: pt presented for evaluation after noting increased leg swelling and elevated BP at home. Pt had nl PIH labs. She was admitted and placed on continuous monitoring. 24 hr Urine collection started. Serial BP and PIH labs  Monitoring done. C/o heartburn responsive to protonix. BP controlled and no evidence of preeclampsia  Discharge Diagnoses: Gestational/chronic HTN 2) morbid obesity 3) GERD IUP @ 37 4/7  Discharge Information: Date: 07/03/2013 Activity: pelvic rest Diet: NAS diet Medications: PNV and labetalol, protonix Condition: stable Instructions: Preeclampsia warning signs Discharge to: home Follow-up Information   Follow up with Basil Blakesley A, MD In 1 week.   Specialty:  Obstetrics and Gynecology   Contact information:   [address] [place] [address] [phone]       Yvonne Ali A 07/03/2013, 6:24 PM

## 2013-07-03 NOTE — Progress Notes (Signed)
Patient ID: Darcel SmallingJanine Cuffe, female   DOB: 06/21/1980, 33 y.o.   MRN: [id number] HD # 3 37 3/7 weeks PIH on labetalol  S: heartburn responsive to protonix. (+) FM denies h/a  O:  VSS  BP 151/81 ( 127-141/47-71)  Lungs clear to A Cor RRR Abd: gravid non tender  Ext (+) edema/ calf tenderness  Tracing; baseline 130 (+) accels reactive occ ctx   UTP( 24 hr) 99 mg protein. Cr CL; 144 ml/min  IMP: PIH controlled on labetalol Morbid obesity Hx PE on heparin here/ lovenox @ home IUP @ 37 3/7 P)d/c home. 2x/weekly NST. IOL @ 39 week. Cont labetalol. PIH warning signs Resume lovenox. Cont protonix. F/u office 1 week

## 2013-07-03 NOTE — Progress Notes (Signed)
Monitors have been off for her to take a shower.

## 2013-07-03 NOTE — Discharge Instructions (Signed)
PIH warning signs, labor precaution, decrease FM

## 2013-07-07 ENCOUNTER — Encounter (HOSPITAL_COMMUNITY): Payer: Self-pay | Admitting: *Deleted

## 2013-07-07 ENCOUNTER — Inpatient Hospital Stay (HOSPITAL_COMMUNITY)
Admission: AD | Admit: 2013-07-07 | Discharge: 2013-07-07 | Disposition: A | Discharge status: Home / Self Care | Payer: 59 | Source: Ambulatory Visit | Attending: Obstetrics and Gynecology | Admitting: Obstetrics and Gynecology

## 2013-07-07 DIAGNOSIS — O99210 Obesity complicating pregnancy, unspecified trimester: Secondary | ICD-10-CM

## 2013-07-07 DIAGNOSIS — Z87891 Personal history of nicotine dependence: Secondary | ICD-10-CM | POA: Insufficient documentation

## 2013-07-07 DIAGNOSIS — O10019 Pre-existing essential hypertension complicating pregnancy, unspecified trimester: Secondary | ICD-10-CM | POA: Insufficient documentation

## 2013-07-07 DIAGNOSIS — O0991 Supervision of high risk pregnancy, unspecified, first trimester: Secondary | ICD-10-CM

## 2013-07-07 LAB — COMPREHENSIVE METABOLIC PANEL
ALT: 21 U/L (ref 0–35)
AST: 21 U/L (ref 0–37)
Albumin: 2.3 g/dL — ABNORMAL LOW (ref 3.5–5.2)
Alkaline Phosphatase: 88 U/L (ref 39–117)
BUN: 10 mg/dL (ref 6–23)
CALCIUM: 8.8 mg/dL (ref 8.4–10.5)
CO2: 23 meq/L (ref 19–32)
CREATININE: 0.87 mg/dL (ref 0.50–1.10)
Chloride: 105 mEq/L (ref 96–112)
GFR calc Af Amer: >90 mL/min (ref >90)
GFR, EST NON AFRICAN AMERICAN: 89 mL/min — AB (ref >90)
GLUCOSE: 82 mg/dL (ref 70–99)
Potassium: 4 mEq/L (ref 3.7–5.3)
SODIUM: 138 meq/L (ref 137–147)
Total Bilirubin: 0.3 mg/dL (ref 0.3–1.2)
Total Protein: 6.4 g/dL (ref 6.0–8.3)

## 2013-07-07 LAB — CBC
HCT: 31.8 % — ABNORMAL LOW (ref 36.0–46.0)
HEMOGLOBIN: 10.5 g/dL — AB (ref 12.0–15.0)
MCH: 27.8 pg (ref 26.0–34.0)
MCHC: 33 g/dL (ref 30.0–36.0)
MCV: 84.1 fL (ref 78.0–100.0)
Platelets: 202 10*3/uL (ref 150–400)
RBC: 3.78 MIL/uL — ABNORMAL LOW (ref 3.87–5.11)
RDW: 14.6 % (ref 11.5–15.5)
WBC: 6.2 10*3/uL (ref 4.0–10.5)

## 2013-07-07 LAB — URIC ACID: URIC ACID, SERUM: 6.1 mg/dL (ref 2.4–7.0)

## 2013-07-07 NOTE — Discharge Instructions (Signed)
PIH warning  Signs labor prec

## 2013-07-07 NOTE — MAU Provider Note (Signed)
History     Chief Complaint  Patient presents with  . Hypertension  33 yo G2P0010  BF @ [redacted] weeks gestation sent from office for [facility] labs, NST and serial BP. Pt had been admitted to hospital and was r/o for superimposed preeclampsia. Pt was placed on labetalol during that admission. Pt presented for routine ob care following hospital admission and was found to have a 11 lb weight increase. Pt denies h/a, visual changes and has heartburn controlled with protonix. PIH labs were normal during the hospital stay and 24 hr urine protein was normal. (+) leg swelling   OB History   Grav Para Term Preterm Abortions TAB SAB Ect Mult Living   2    1   1         Past Medical History  Diagnosis Date  . Ectopic pregnancy   . Pregnancy induced hypertension   . PE (pulmonary embolism)     Past Surgical History  Procedure Laterality Date  . Hip surgery    . Bariatric surgery      gastric sleeve    Family History  Problem Relation Age of Onset  . Hypertension Maternal Grandfather   . Hyperlipidemia Maternal Grandfather   . Cancer Paternal Grandmother 73    ovarian cancer  . Diabetes Paternal Grandmother     History  Substance Use Topics  . Smoking status: Former Smoker    Quit date: 05/12/2006  . Smokeless tobacco: Never Used  . Alcohol Use: Yes     Comment: stopped with positive UPT    Allergies: No Known Allergies  Prescriptions prior to admission  Medication Sig Dispense Refill  . enoxaparin (LOVENOX) 80 MG/0.8ML injection Inject 80 mg into the skin daily.      Marland Kitchen labetalol (NORMODYNE) 100 MG tablet Take 1 tablet (100 mg total) by mouth 2 (two) times daily.  60 tablet  3  . pantoprazole (PROTONIX) 40 MG tablet Take 1 tablet (40 mg total) by mouth daily.  30 tablet  11  . Prenatal Vit-Fe Fumarate-FA (PRENATAL MULTIVITAMIN) TABS tablet Take 1 tablet by mouth daily at 12 noon.         Physical Exam   Blood pressure 151/79, pulse 78, temperature 98.9 F (37.2 C), temperature  source Oral, resp. rate 20, last menstrual period 10/17/2012, SpO2 99.00%.  No exam performed today, done in office today. CBC    Component Value Date/Time   WBC 6.2 07/07/2013 1246   RBC 3.78* 07/07/2013 1246   HGB 10.5* 07/07/2013 1246   HCT 31.8* 07/07/2013 1246   PLT 202 07/07/2013 1246   MCV 84.1 07/07/2013 1246   MCH 27.8 07/07/2013 1246   MCHC 33.0 07/07/2013 1246   RDW 14.6 07/07/2013 1246   LYMPHSABS 1.9 12/10/2012 1015   MONOABS 0.5 12/10/2012 1015   EOSABS 0.1 12/10/2012 1015   BASOSABS 0.0 12/10/2012 1015    CMP     Component Value Date/Time   NA 138 07/07/2013 1246   K 4.0 07/07/2013 1246   CL 105 07/07/2013 1246   CO2 23 07/07/2013 1246   GLUCOSE 82 07/07/2013 1246   BUN 10 07/07/2013 1246   CREATININE 0.87 07/07/2013 1246   CREATININE 0.89 07/02/2013 0635   CREATININE 0.82 09/03/2012 0956   CALCIUM 8.8 07/07/2013 1246   PROT 6.4 07/07/2013 1246   ALBUMIN 2.3* 07/07/2013 1246   AST 21 07/07/2013 1246   ALT 21 07/07/2013 1246   ALKPHOS 88 07/07/2013 1246   BILITOT 0.3 07/07/2013 1246  GFRNONAA 89* 07/07/2013 1246   GFRAA >90 07/07/2013 1246  Tracing: baseline 130 (+) accels to 150 good variability cat 1   ED Course   Chronic HTN on med w/o evidence of superimposed preeclampsia P) d/c home PIH warning signs. Labor precautions f/u office 3 days. Continue labetalol  MDM   Robertlee Rogacki A, MD 2:18 PM 07/07/2013

## 2013-07-07 NOTE — MAU Note (Signed)
Patient states she was seen in the office this am and her blood pressure was elevated and sent to MAU for evaluation. Reports good fetal movement, occasional yesterday, none today. Denies bleeding or leaking. Denies headaches or visual changes.

## 2013-07-11 ENCOUNTER — Other Ambulatory Visit: Payer: Self-pay | Admitting: Obstetrics and Gynecology

## 2013-07-14 ENCOUNTER — Inpatient Hospital Stay (HOSPITAL_COMMUNITY)
Admission: AD | Admit: 2013-07-14 | Discharge: 2013-07-17 | DRG: 774 | Disposition: A | Discharge status: Home / Self Care | Payer: 59 | Source: Ambulatory Visit | Attending: Obstetrics and Gynecology | Admitting: Obstetrics and Gynecology

## 2013-07-14 ENCOUNTER — Inpatient Hospital Stay (HOSPITAL_COMMUNITY): Payer: 59 | Admitting: Anesthesiology

## 2013-07-14 ENCOUNTER — Encounter (HOSPITAL_COMMUNITY): Payer: 59 | Admitting: Anesthesiology

## 2013-07-14 ENCOUNTER — Encounter (HOSPITAL_COMMUNITY): Payer: Self-pay | Admitting: Anesthesiology

## 2013-07-14 ENCOUNTER — Inpatient Hospital Stay (HOSPITAL_COMMUNITY)
Admission: RE | Admit: 2013-07-14 | Discharge: 2013-07-14 | Disposition: A | Discharge status: Home / Self Care | Payer: 59 | Source: Ambulatory Visit | Attending: Obstetrics and Gynecology | Admitting: Obstetrics and Gynecology

## 2013-07-14 DIAGNOSIS — D62 Acute posthemorrhagic anemia: Secondary | ICD-10-CM | POA: Diagnosis not present

## 2013-07-14 DIAGNOSIS — O9903 Anemia complicating the puerperium: Secondary | ICD-10-CM | POA: Diagnosis not present

## 2013-07-14 DIAGNOSIS — O10919 Unspecified pre-existing hypertension complicating pregnancy, unspecified trimester: Secondary | ICD-10-CM | POA: Diagnosis present

## 2013-07-14 DIAGNOSIS — K219 Gastro-esophageal reflux disease without esophagitis: Secondary | ICD-10-CM | POA: Diagnosis present

## 2013-07-14 DIAGNOSIS — O99892 Other specified diseases and conditions complicating childbirth: Secondary | ICD-10-CM | POA: Diagnosis present

## 2013-07-14 DIAGNOSIS — E669 Obesity, unspecified: Secondary | ICD-10-CM | POA: Diagnosis present

## 2013-07-14 DIAGNOSIS — O1002 Pre-existing essential hypertension complicating childbirth: Principal | ICD-10-CM | POA: Diagnosis present

## 2013-07-14 DIAGNOSIS — O9989 Other specified diseases and conditions complicating pregnancy, childbirth and the puerperium: Secondary | ICD-10-CM

## 2013-07-14 DIAGNOSIS — O99844 Bariatric surgery status complicating childbirth: Secondary | ICD-10-CM | POA: Diagnosis present

## 2013-07-14 DIAGNOSIS — O99214 Obesity complicating childbirth: Secondary | ICD-10-CM

## 2013-07-14 DIAGNOSIS — Z22330 Carrier of Group B streptococcus: Secondary | ICD-10-CM

## 2013-07-14 DIAGNOSIS — Z87891 Personal history of nicotine dependence: Secondary | ICD-10-CM

## 2013-07-14 LAB — CBC
HCT: 32.8 % — ABNORMAL LOW (ref 36.0–46.0)
HCT: 33.6 % — ABNORMAL LOW (ref 36.0–46.0)
HEMOGLOBIN: 10.8 g/dL — AB (ref 12.0–15.0)
Hemoglobin: 11.2 g/dL — ABNORMAL LOW (ref 12.0–15.0)
MCH: 27.5 pg (ref 26.0–34.0)
MCH: 27.9 pg (ref 26.0–34.0)
MCHC: 32.9 g/dL (ref 30.0–36.0)
MCHC: 33.3 g/dL (ref 30.0–36.0)
MCV: 83.5 fL (ref 78.0–100.0)
MCV: 83.8 fL (ref 78.0–100.0)
PLATELETS: 228 10*3/uL (ref 150–400)
Platelets: 238 10*3/uL (ref 150–400)
RBC: 3.93 MIL/uL (ref 3.87–5.11)
RBC: 4.01 MIL/uL (ref 3.87–5.11)
RDW: 14.7 % (ref 11.5–15.5)
RDW: 14.7 % (ref 11.5–15.5)
WBC: 6.6 10*3/uL (ref 4.0–10.5)
WBC: 9.1 10*3/uL (ref 4.0–10.5)

## 2013-07-14 LAB — TYPE AND SCREEN
ABO/RH(D): B POS
ANTIBODY SCREEN: NEGATIVE

## 2013-07-14 LAB — COMPREHENSIVE METABOLIC PANEL
ALT: 39 U/L — ABNORMAL HIGH (ref 0–35)
AST: 35 U/L (ref 0–37)
Albumin: 2.4 g/dL — ABNORMAL LOW (ref 3.5–5.2)
Alkaline Phosphatase: 104 U/L (ref 39–117)
BILIRUBIN TOTAL: 0.3 mg/dL (ref 0.3–1.2)
BUN: 12 mg/dL (ref 6–23)
CHLORIDE: 106 meq/L (ref 96–112)
CO2: 20 meq/L (ref 19–32)
Calcium: 8.9 mg/dL (ref 8.4–10.5)
Creatinine, Ser: 0.91 mg/dL (ref 0.50–1.10)
GFR, EST NON AFRICAN AMERICAN: 84 mL/min — AB (ref >90)
GLUCOSE: 86 mg/dL (ref 70–99)
Potassium: 4 mEq/L (ref 3.7–5.3)
Sodium: 138 mEq/L (ref 137–147)
Total Protein: 6.4 g/dL (ref 6.0–8.3)

## 2013-07-14 LAB — URIC ACID: URIC ACID, SERUM: 6.5 mg/dL (ref 2.4–7.0)

## 2013-07-14 LAB — RPR: RPR Ser Ql: NONREACTIVE

## 2013-07-14 MED ORDER — BUTORPHANOL TARTRATE 1 MG/ML IJ SOLN
2.0000 mg | INTRAMUSCULAR | Status: DC | PRN
  Administered 2013-07-14: 2 mg via INTRAVENOUS
  Filled 2013-07-14 (×2): qty 2

## 2013-07-14 MED ORDER — PHENYLEPHRINE 40 MCG/ML (10ML) SYRINGE FOR IV PUSH (FOR BLOOD PRESSURE SUPPORT)
PREFILLED_SYRINGE | INTRAVENOUS | Status: DC
Start: 2013-07-14 — End: 2013-07-15
  Filled 2013-07-14: qty 10

## 2013-07-14 MED ORDER — PHENYLEPHRINE 40 MCG/ML (10ML) SYRINGE FOR IV PUSH (FOR BLOOD PRESSURE SUPPORT)
80.0000 ug | PREFILLED_SYRINGE | INTRAVENOUS | Status: DC | PRN
  Filled 2013-07-14: qty 2

## 2013-07-14 MED ORDER — TERBUTALINE SULFATE 1 MG/ML IJ SOLN: 0.2500 mg | Freq: Once | INTRAMUSCULAR | Status: AC | PRN

## 2013-07-14 MED ORDER — LIDOCAINE HCL (PF) 1 % IJ SOLN
30.0000 mL | INTRAMUSCULAR | Status: AC | PRN
  Administered 2013-07-15: 30 mL via SUBCUTANEOUS
  Filled 2013-07-14 (×2): qty 30

## 2013-07-14 MED ORDER — LACTATED RINGERS IV SOLN
INTRAVENOUS | Status: DC
  Administered 2013-07-14 – 2013-07-15 (×2): via INTRAVENOUS

## 2013-07-14 MED ORDER — FENTANYL 2.5 MCG/ML BUPIVACAINE 1/10 % EPIDURAL INFUSION (WH - ANES)
INTRAMUSCULAR | Status: DC | PRN
  Administered 2013-07-14: 15 mL/h via EPIDURAL

## 2013-07-14 MED ORDER — LACTATED RINGERS IV SOLN
500.0000 mL | INTRAVENOUS | Status: DC | PRN
Start: 2013-07-14 — End: 2013-07-15

## 2013-07-14 MED ORDER — ONDANSETRON HCL 4 MG/2ML IJ SOLN: 4.0000 mg | Freq: Four times a day (QID) | INTRAMUSCULAR | Status: DC | PRN

## 2013-07-14 MED ORDER — OXYTOCIN BOLUS FROM INFUSION: 500.0000 mL | INTRAVENOUS | Status: DC

## 2013-07-14 MED ORDER — ACETAMINOPHEN 325 MG PO TABS: 650.0000 mg | ORAL_TABLET | ORAL | Status: DC | PRN

## 2013-07-14 MED ORDER — PENICILLIN G POTASSIUM 5000000 UNITS IJ SOLR
2.5000 10*6.[IU] | INTRAVENOUS | Status: DC
  Administered 2013-07-14 – 2013-07-15 (×5): 2.5 10*6.[IU] via INTRAVENOUS
  Filled 2013-07-14 (×8): qty 2.5

## 2013-07-14 MED ORDER — FENTANYL 2.5 MCG/ML BUPIVACAINE 1/10 % EPIDURAL INFUSION (WH - ANES)
INTRAMUSCULAR | Status: AC
  Filled 2013-07-14: qty 125

## 2013-07-14 MED ORDER — PENICILLIN G POTASSIUM 5000000 UNITS IJ SOLR
5.0000 10*6.[IU] | Freq: Once | INTRAVENOUS | Status: AC
  Administered 2013-07-14: 5 10*6.[IU] via INTRAVENOUS
  Filled 2013-07-14: qty 5

## 2013-07-14 MED ORDER — LABETALOL HCL 5 MG/ML IV SOLN
20.0000 mg | Freq: Once | INTRAVENOUS | Status: AC
  Administered 2013-07-14: 20 mg via INTRAVENOUS
  Filled 2013-07-14: qty 4

## 2013-07-14 MED ORDER — DIPHENHYDRAMINE HCL 50 MG/ML IJ SOLN: 12.5000 mg | INTRAMUSCULAR | Status: DC | PRN

## 2013-07-14 MED ORDER — OXYTOCIN 40 UNITS IN LACTATED RINGERS INFUSION - SIMPLE MED
1.0000 m[IU]/min | INTRAVENOUS | Status: DC
  Administered 2013-07-14: 10 m[IU]/min via INTRAVENOUS
  Administered 2013-07-14: 16 m[IU]/min via INTRAVENOUS
  Administered 2013-07-14: 2 m[IU]/min via INTRAVENOUS
  Administered 2013-07-14: 14 m[IU]/min via INTRAVENOUS
  Administered 2013-07-14: 8 m[IU]/min via INTRAVENOUS
  Administered 2013-07-14: 6 m[IU]/min via INTRAVENOUS
  Administered 2013-07-14: 4 m[IU]/min via INTRAVENOUS
  Administered 2013-07-14: 12 m[IU]/min via INTRAVENOUS
  Administered 2013-07-14: 18 m[IU]/min via INTRAVENOUS
  Filled 2013-07-14: qty 1000

## 2013-07-14 MED ORDER — IBUPROFEN 600 MG PO TABS
600.0000 mg | ORAL_TABLET | Freq: Four times a day (QID) | ORAL | Status: DC | PRN
  Administered 2013-07-15: 600 mg via ORAL
  Filled 2013-07-14: qty 1

## 2013-07-14 MED ORDER — OXYTOCIN 10 UNIT/ML IJ SOLN: 10.0000 [IU] | Freq: Once | INTRAMUSCULAR | Status: DC

## 2013-07-14 MED ORDER — CITRIC ACID-SODIUM CITRATE 334-500 MG/5ML PO SOLN
30.0000 mL | ORAL | Status: DC | PRN
  Administered 2013-07-15 (×2): 30 mL via ORAL
  Filled 2013-07-14 (×2): qty 15

## 2013-07-14 MED ORDER — EPHEDRINE 5 MG/ML INJ
INTRAVENOUS | Status: AC
  Filled 2013-07-14: qty 4

## 2013-07-14 MED ORDER — LABETALOL HCL 100 MG PO TABS
100.0000 mg | ORAL_TABLET | Freq: Two times a day (BID) | ORAL | Status: DC
  Administered 2013-07-14 – 2013-07-15 (×2): 100 mg via ORAL
  Filled 2013-07-14 (×3): qty 1

## 2013-07-14 MED ORDER — OXYCODONE-ACETAMINOPHEN 5-325 MG PO TABS: 1.0000 | ORAL_TABLET | ORAL | Status: DC | PRN

## 2013-07-14 MED ORDER — EPHEDRINE 5 MG/ML INJ
10.0000 mg | INTRAVENOUS | Status: DC | PRN
  Filled 2013-07-14: qty 2

## 2013-07-14 MED ORDER — LACTATED RINGERS IV SOLN
500.0000 mL | Freq: Once | INTRAVENOUS | Status: AC
  Administered 2013-07-14: 500 mL via INTRAVENOUS

## 2013-07-14 MED ORDER — OXYTOCIN 40 UNITS IN LACTATED RINGERS INFUSION - SIMPLE MED
62.5000 mL/h | INTRAVENOUS | Status: DC
  Administered 2013-07-15: 62.5 mL/h via INTRAVENOUS

## 2013-07-14 MED ORDER — EPHEDRINE 5 MG/ML INJ
10.0000 mg | INTRAVENOUS | Status: DC | PRN
Start: 2013-07-14 — End: 2013-07-15
  Filled 2013-07-14: qty 2

## 2013-07-14 MED ORDER — LIDOCAINE HCL (PF) 1 % IJ SOLN
INTRAMUSCULAR | Status: DC | PRN
  Administered 2013-07-14: 5 mL
  Administered 2013-07-14: 4 mL

## 2013-07-14 MED ORDER — FENTANYL 2.5 MCG/ML BUPIVACAINE 1/10 % EPIDURAL INFUSION (WH - ANES)
14.0000 mL/h | INTRAMUSCULAR | Status: DC | PRN
  Administered 2013-07-15: 14 mL/h via EPIDURAL
  Filled 2013-07-14: qty 125

## 2013-07-14 NOTE — Progress Notes (Signed)
S:  Pt comfortable. Epidural  O: VE per RN. Last check 8:30 Pm( 5.5 cm/-3 station w/ caput) Tracing reviewed: baseline 140 some early decels Ctx q 2 mins ( adequate labor)  IMP: Protracted active phase/arrest Hx PE on lovenox Morbid obesity GBS cx (+) on IV PCN Chronic HTn on labetalol P) cont pitocin. Check cervix @ 12:30 am

## 2013-07-14 NOTE — Progress Notes (Signed)
Yvonne SmallingJanine Ali is a 33 y.o. G2P0010 at 6126w0d by ultrasound admitted for induction of labor due to Hypertension.  Subjective: No chief complaint on file. no complaint Pitocin8 MIU  Objective: BP 157/93  Pulse 95  Temp(Src) 97.6 F (36.4 C) (Oral)  Resp 18  Ht 5\' 10"  (1.778 m)  Wt 168.738 kg (372 lb)  BMI 53.38 kg/m2  LMP 10/17/2012      FHT:  FHR: 145 bpm, variability: moderate,  accelerations:  Present,  decelerations:  Absent UC:   regular, every 2-3 minutes SVE:   4-5 cm dilated, 70%effaced, -3 station Tracing: cat 1  Labs: Lab Results  Component Value Date   WBC 6.6 07/14/2013   HGB 10.8* 07/14/2013   HCT 32.8* 07/14/2013   MCV 83.5 07/14/2013   PLT 238 07/14/2013    Assessment / Plan: Induction of labor due to HTN,  progressing well on pitocin Morbid obesity Hx PE on lovenox HTN on labetalol ROM (+) GBS P) cont pitocin exaggerated right sims. Cont IV PCN cont labetalol Anticipated MOD:  NSVD  Yvonne Ali A 07/14/2013, 1:28 PM

## 2013-07-14 NOTE — Plan of Care (Signed)
Problem: Consults Goal: Birthing Suites Patient Information Press F2 to bring up selections list  Outcome: Not Applicable Date Met:  17/00/17  Pt 37-[redacted] weeks EGA and Outpatient induction  ghg

## 2013-07-14 NOTE — Progress Notes (Signed)
Foley bulb fell out while patient in bathroom.

## 2013-07-14 NOTE — H&P (Signed)
Yvonne SmallingJanine Ali is a 33 y.o. female presenting@ 39 weeks  for IOL 2nd to chronic HTN on labetalol. (+) GBS (+) FM intact membrane  Maternal Medical History:  Fetal activity: Perceived fetal activity is normal.    Prenatal complications: PIH.   Prenatal Complications - Diabetes: none.    OB History   Grav Para Term Preterm Abortions TAB SAB Ect Mult Living   2    1   1        Past Medical History  Diagnosis Date  . Ectopic pregnancy   . Pregnancy induced hypertension   . PE (pulmonary embolism)    Past Surgical History  Procedure Laterality Date  . Hip surgery    . Bariatric surgery      gastric sleeve   Family History: family history includes Cancer (age of onset: 8069) in her paternal grandmother; Diabetes in her paternal grandmother; Hyperlipidemia in her maternal grandfather; Hypertension in her maternal grandfather. Social History:  reports that she quit smoking about 7 years ago. She has never used smokeless tobacco. She reports that she drinks alcohol. She reports that she does not use illicit drugs.   Prenatal Transfer Tool  Maternal Diabetes: No Genetic Screening: Normal Maternal Ultrasounds/Referrals: Normal Fetal Ultrasounds or other Referrals:  None Maternal Substance Abuse:  No Significant Maternal Medications:  Meds include: Other: labetalol, lovenox Significant Maternal Lab Results:  Lab values include: Group B Strep positive Other Comments:  hx PE, morbid obesity  Review of Systems  All other systems reviewed and are negative.      Blood pressure 153/86, pulse 113, temperature 98.2 F (36.8 C), temperature source Oral, resp. rate 18, height 5\' 10"  (1.778 m), weight 168.738 kg (372 lb), last menstrual period 10/17/2012. Exam Physical Exam  Constitutional: She is oriented to person, place, and time. She appears well-developed and well-nourished.  Eyes: EOM are normal.  Neck: Neck supple.  Cardiovascular: Regular rhythm.   Respiratory: Breath sounds  normal.  GI: Soft.  Musculoskeletal: She exhibits edema.  Neurological: She is alert and oriented to person, place, and time.  Skin: Skin is warm and dry.  Psychiatric: She has a normal mood and affect.    VE 2/60/-3 AROM clear fluid w/ placement of balloon Prenatal labs: ABO, Rh: --/--/B POS, B POS (01/21 0915) Antibody: NEG (01/21 0915) Rubella: 4.87 (07/01 1015) RPR: NON REAC (07/01 1015)  HBsAg: NEGATIVE (07/01 1015)  HIV: NON REACTIVE (07/01 1015)  GBS:   Positive   Assessment/Plan: Chronic HTN on labetalol Term gestation Hx PE on lovenox( last dose Saturday) Morbid obesity GBS cx (+) P) admit routine labs. IV pitocin. Intracervical balloon cont labetalol analgesic prn. Start IV PCN resume lovenox pp    Aarthi Uyeno A 07/14/2013, 8:53 AM

## 2013-07-14 NOTE — Anesthesia Preprocedure Evaluation (Signed)
Anesthesia Evaluation  Patient identified by MRN, date of birth, ID band Patient awake    Reviewed: Allergy & Precautions, H&P , Patient's Chart, lab work & pertinent test results  Airway Mallampati: III TM Distance: >3 FB Neck ROM: Full    Dental no notable dental hx. (+) Teeth Intact   Pulmonary former smoker,  breath sounds clear to auscultation  Pulmonary exam normal       Cardiovascular hypertension, Pt. on medications and Pt. 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 was on Lovenox during pregnancy- Last dose 07/12/2013   Neuro/Psych negative neurological ROS  negative psych ROS   GI/Hepatic Neg liver ROS, GERD-  Medicated and Controlled,  Endo/Other  Morbid obesitySuper MO  Renal/GU negative Renal ROS  negative genitourinary   Musculoskeletal negative musculoskeletal ROS (+)   Abdominal (+) + obese,   Peds  Hematology  (+) anemia ,   Anesthesia Other Findings   Reproductive/Obstetrics (+) Pregnancy                           Anesthesia Physical Anesthesia Plan  ASA: III  Anesthesia Plan: Epidural   Post-op Pain Management:    Induction:   Airway Management Planned: Natural Airway  Additional Equipment:   Intra-op Plan:   Post-operative Plan:   Informed Consent: I have reviewed the patients History and Physical, chart, labs and discussed the procedure including the risks, benefits and alternatives for the proposed anesthesia with the patient or authorized representative who has indicated his/her understanding and acceptance.     Plan Discussed with: Anesthesiologist  Anesthesia Plan Comments:         Anesthesia Quick Evaluation

## 2013-07-14 NOTE — Anesthesia Procedure Notes (Signed)
Epidural Patient location during procedure: OB Start time: 07/14/2013 7:43 PM  Staffing Anesthesiologist: Blanchard Willhite A. Performed by: anesthesiologist   Preanesthetic Checklist Completed: patient identified, site marked, surgical consent, pre-op evaluation, timeout performed, IV checked, risks and benefits discussed and monitors and equipment checked  Epidural Patient position: sitting Prep: site prepped and draped and DuraPrep Patient monitoring: continuous pulse ox and blood pressure Approach: midline Injection technique: LOR air  Needle:  Needle type: Tuohy  Needle gauge: 17 G Needle length: 9 cm and 9 Needle insertion depth: 9 cm Catheter type: closed end flexible Catheter size: 19 Gauge Catheter at skin depth: 14 cm Test dose: negative and Other  Assessment Events: blood not aspirated, injection not painful, no injection resistance, negative IV test and no paresthesia  Additional Notes Patient identified. Risks and benefits discussed including failed block, incomplete  Pain control, post dural puncture headache, nerve damage, paralysis, blood pressure Changes, nausea, vomiting, reactions to medications-both toxic and allergic and post Partum back pain. All questions were answered. Patient expressed understanding and wished to proceed. Sterile technique was used throughout procedure. Epidural site was Dressed with sterile barrier dressing. No paresthesias, signs of intravascular injection Or signs of intrathecal spread were encountered.  Patient was more comfortable after the epidural was dosed. Please see RN's note for documentation of vital signs and FHR which are stable.

## 2013-07-14 NOTE — Progress Notes (Signed)
S: notes very painful ctx. Has had one dose of Stadol  O: Pitocin 18 MIU. S/p IV labetalol x 2 this pm due for oral dose @ 8 pm BP 171/119 VE: 5/70-80%/-3 asynclytic IUPC placed (of note intracervical balloon out at 10: 45 am)  Tracing: baseline 140 small variables (+) occ early decel Ctx q 2 mins  IMP: Protracted/arrest of dilation Chronic HTn on labetalol with increased BP  Aggravated by labor pain Hx PE off lovenox GBS cx (+) on IV pCN P) advised pt to get epidural to relax her and if need for C/S. IV labetalol or apresoline prn elev BP. Cont labetalol Cont IV PCN. Return to exaggerated right sims position

## 2013-07-15 ENCOUNTER — Encounter (HOSPITAL_COMMUNITY): Payer: Self-pay | Admitting: *Deleted

## 2013-07-15 MED ORDER — ONDANSETRON HCL 4 MG/2ML IJ SOLN: 4.0000 mg | INTRAMUSCULAR | Status: DC | PRN

## 2013-07-15 MED ORDER — BUPIVACAINE HCL (PF) 0.25 % IJ SOLN
INTRAMUSCULAR | Status: DC | PRN
  Administered 2013-07-15: 8 mL via EPIDURAL

## 2013-07-15 MED ORDER — ONDANSETRON HCL 4 MG PO TABS: 4.0000 mg | ORAL_TABLET | ORAL | Status: DC | PRN

## 2013-07-15 MED ORDER — SENNOSIDES-DOCUSATE SODIUM 8.6-50 MG PO TABS
2.0000 | ORAL_TABLET | ORAL | Status: DC
  Administered 2013-07-15 – 2013-07-17 (×2): 2 via ORAL
  Filled 2013-07-15 (×2): qty 2

## 2013-07-15 MED ORDER — ZOLPIDEM TARTRATE 5 MG PO TABS: 5.0000 mg | ORAL_TABLET | Freq: Every evening | ORAL | Status: DC | PRN

## 2013-07-15 MED ORDER — WITCH HAZEL-GLYCERIN EX PADS: 1.0000 "application " | MEDICATED_PAD | CUTANEOUS | Status: DC | PRN

## 2013-07-15 MED ORDER — LABETALOL HCL 100 MG PO TABS
100.0000 mg | ORAL_TABLET | Freq: Two times a day (BID) | ORAL | Status: DC
  Administered 2013-07-15 – 2013-07-17 (×5): 100 mg via ORAL
  Filled 2013-07-15 (×7): qty 1

## 2013-07-15 MED ORDER — OXYCODONE-ACETAMINOPHEN 5-325 MG PO TABS
1.0000 | ORAL_TABLET | ORAL | Status: DC | PRN
  Administered 2013-07-15 – 2013-07-17 (×3): 1 via ORAL
  Filled 2013-07-15 (×3): qty 1

## 2013-07-15 MED ORDER — BENZOCAINE-MENTHOL 20-0.5 % EX AERO
1.0000 "application " | INHALATION_SPRAY | CUTANEOUS | Status: DC | PRN
  Administered 2013-07-15: 1 via TOPICAL
  Filled 2013-07-15: qty 56

## 2013-07-15 MED ORDER — FERROUS SULFATE 325 (65 FE) MG PO TABS
325.0000 mg | ORAL_TABLET | Freq: Two times a day (BID) | ORAL | Status: DC
  Administered 2013-07-15 – 2013-07-17 (×4): 325 mg via ORAL
  Filled 2013-07-15 (×4): qty 1

## 2013-07-15 MED ORDER — PRENATAL MULTIVITAMIN CH
1.0000 | ORAL_TABLET | Freq: Every day | ORAL | Status: DC
Start: 2013-07-15 — End: 2013-07-17
  Administered 2013-07-15 – 2013-07-17 (×3): 1 via ORAL
  Filled 2013-07-15 (×3): qty 1

## 2013-07-15 MED ORDER — SODIUM BICARBONATE 8.4 % IV SOLN
INTRAVENOUS | Status: DC | PRN
  Administered 2013-07-15 (×2): 5 mL via EPIDURAL
  Administered 2013-07-15: 10 mL via EPIDURAL

## 2013-07-15 MED ORDER — ENOXAPARIN SODIUM 80 MG/0.8ML ~~LOC~~ SOLN
80.0000 mg | SUBCUTANEOUS | Status: DC
  Administered 2013-07-15 – 2013-07-16 (×2): 80 mg via SUBCUTANEOUS
  Filled 2013-07-15 (×3): qty 0.8

## 2013-07-15 MED ORDER — DIBUCAINE 1 % RE OINT: 1.0000 "application " | TOPICAL_OINTMENT | RECTAL | Status: DC | PRN

## 2013-07-15 MED ORDER — LANOLIN HYDROUS EX OINT: TOPICAL_OINTMENT | CUTANEOUS | Status: DC | PRN

## 2013-07-15 MED ORDER — SIMETHICONE 80 MG PO CHEW: 80.0000 mg | CHEWABLE_TABLET | ORAL | Status: DC | PRN

## 2013-07-15 MED ORDER — DIPHENHYDRAMINE HCL 25 MG PO CAPS: 25.0000 mg | ORAL_CAPSULE | Freq: Four times a day (QID) | ORAL | Status: DC | PRN

## 2013-07-15 MED ORDER — IBUPROFEN 600 MG PO TABS
600.0000 mg | ORAL_TABLET | Freq: Four times a day (QID) | ORAL | Status: DC
  Administered 2013-07-15 – 2013-07-17 (×9): 600 mg via ORAL
  Filled 2013-07-15 (×9): qty 1

## 2013-07-15 NOTE — Progress Notes (Signed)
S: Pushing  O: VE: fully +1/+2 station  Tracing: baseline 150 (+) small variable Ctx q 2 mins  IMP: complete Morbid obesity Hx PE off lovenox Chronic HTN on labetalol  P) cont pushing

## 2013-07-15 NOTE — Anesthesia Postprocedure Evaluation (Signed)
  Anesthesia Post-op Note  Patient: Yvonne SmallingJanine Ali  Procedure(s) Performed: * No procedures listed *  Patient Location: PACU and Mother/Baby  Anesthesia Type:Epidural  Level of Consciousness: awake, alert  and oriented  Airway and Oxygen Therapy: Patient Spontanous Breathing  Post-op Pain: none  Post-op Assessment: Post-op Vital signs reviewed, Patient's Cardiovascular Status Stable, No headache, No backache, No residual numbness and No residual motor weakness  Post-op Vital Signs: Reviewed and stable  Complications: No apparent anesthesia complications

## 2013-07-15 NOTE — Progress Notes (Signed)
S: c/o pain on the left side, breathing with ctx Denies rectal/vaginal pressure   O: Epidural Pitocin 18 miu BP 176/76 VE 8/90%/0 bloody show  Tracing: baseline 145 (+) variability Ctx q 2-3 mins  IMP: Active phase Hx PE off  lovenox Morbid obesity GBS cx (+) on IV PCN Chronic HTn on labetalol  P) anesthesia for epidural mgmt. Cont pitocin

## 2013-07-16 DIAGNOSIS — D62 Acute posthemorrhagic anemia: Secondary | ICD-10-CM | POA: Diagnosis not present

## 2013-07-16 LAB — CBC
HCT: 25.2 % — ABNORMAL LOW (ref 36.0–46.0)
HEMOGLOBIN: 8.4 g/dL — AB (ref 12.0–15.0)
MCH: 28 pg (ref 26.0–34.0)
MCHC: 33.3 g/dL (ref 30.0–36.0)
MCV: 84 fL (ref 78.0–100.0)
Platelets: 208 10*3/uL (ref 150–400)
RBC: 3 MIL/uL — ABNORMAL LOW (ref 3.87–5.11)
RDW: 15 % (ref 11.5–15.5)
WBC: 14.5 10*3/uL — ABNORMAL HIGH (ref 4.0–10.5)

## 2013-07-16 NOTE — Progress Notes (Signed)
Patient ID: Darcel SmallingJanine Lynes, female   DOB: 06/11/1981, 33 y.o.   MRN: [id number] PPD # 1  Subjective: Pt reports feeling well, but tired. / Pain controlled with ibuprofen and rare percocet Denies SOB, CP, HA, visual disturbances, epigastric pain. Tolerating po/ Voiding without problems/ No n/v Bleeding is moderate Newborn info:  Information for the patient's newborn:  Windell MomentRawlins, Girl Roselene [[id number]][[id number]]  female Feeding: breast   Objective:  VS: Blood pressure 128/78, pulse 100, temperature 97.8 F (36.6 C), temperature source Oral, resp. rate 20    Recent Labs  07/14/13 1855 07/16/13 0500  WBC 9.1 14.5*  HGB 11.2* 8.4*  HCT 33.6* 25.2*  PLT 228 208    Blood type: B POS Rubella: Immune    Physical Exam:  General:  alert, cooperative and no distress CV: Regular rate and rhythm Resp: clear Abdomen: soft, nontender, normal bowel sounds Uterine Fundus: firm, below umbilicus, nontender Perineum: healing with mild edema Lochia: minimal Ext: edema +2 to +3 pedal, pretib, and knees and Homans sign is negative, no sign of DVT   A/P: PPD # 1/ G2P1011/ S/P: SVD with sulcus repair Hx PE and cHTN Doing well Continue routine post partum orders Anticipate D/C home in AM    Demetrius RevelFISHER,Lowana Hable K, MSN, [facility]NP 07/16/2013, 8:41 AM

## 2013-07-16 NOTE — Progress Notes (Signed)
Report from previous RN of BP 178/96 around 1900. No c/o S&S of PIH. Donette LarryMelanie Bhambri CNM called. May give labetalol at 2100. RN will recheck BP prior to administration. Will continue to monitor patient.

## 2013-07-17 MED ORDER — IBUPROFEN 600 MG PO TABS: 600.0000 mg | ORAL_TABLET | Freq: Four times a day (QID) | ORAL | Status: DC

## 2013-07-17 MED ORDER — FERROUS SULFATE 325 (65 FE) MG PO TABS: 325.0000 mg | ORAL_TABLET | Freq: Two times a day (BID) | ORAL | Status: DC

## 2013-07-17 MED ORDER — OXYCODONE-ACETAMINOPHEN 5-325 MG PO TABS: 1.0000 | ORAL_TABLET | Freq: Four times a day (QID) | ORAL | Status: DC | PRN

## 2013-07-17 NOTE — Discharge Summary (Signed)
Obstetric Discharge Summary Reason for Admission: induction of labor, hx PE anticoagulated, PIH. anemia Prenatal Procedures: NST and ultrasound Intrapartum Procedures: spontaneous vaginal delivery Postpartum Procedures: none Complications-Operative and Postpartum: left sulcus laceration Hemoglobin  Date Value Range Status  07/16/2013 8.4* 12.0 - 15.0 g/dL Final     DELTA CHECK NOTED     REPEATED TO VERIFY     HCT  Date Value Range Status  07/16/2013 25.2* 36.0 - 46.0 % Final    Physical Exam:  General: alert, cooperative and no distress Lochia: appropriate Uterine Fundus: firm, midline, U-2 DVT Evaluation: No evidence of DVT seen on physical exam. Negative Homan's sign. No cords or calf tenderness. Calf/Ankle traceedema is present.  Discharge Diagnoses: Term Pregnancy-delivered, hx PE anticoagulated, PIH  Discharge Information: Date: 07/17/2013 Activity: pelvic rest Diet: routine Medications: PNV, Ibuprofen, Iron, Percocet and Lovenox and Labetalol Condition: stable Instructions: refer to practice specific booklet Discharge to: home Follow-up Information   Follow up with Oliwia Berzins A, MD. Schedule an appointment as soon as possible for a visit in 6 weeks. ([facility])    Specialty:  Obstetrics and Gynecology   Contact information:   [address] [place] [address] [phone]       Newborn Data: Live born female on 07/15/2013 Birth Weight: 8 lb 5 oz (3770 g) APGAR: 7, 9  Home with mother.  Kenard GowerAWSON, ROLITTA, M, MSN, CNM 07/17/2013, 9:24 AM

## 2013-07-17 NOTE — Discharge Instructions (Signed)
Breast Pumping Tips °Pumping your breast milk is a good way to stimulate milk production and have a steady supply of breast milk for your infant. Pumping is most helpful during your infant's growth spurts, when involving dad or a family member, or when you are away. There are several types of pumps available. They can be purchased at a baby or maternity store. You can begin pumping soon after delivery, but some experts believe that you should wait about four weeks to give your infant a bottle. °In general, the more you breastfeed or pump, the more milk you will have for your infant. It is also important to take good care of yourself. This will reduce stress and help your body to create a healthy supply of milk. Your caregiver or lactation consultant can give you the information and support you need in your efforts to breastfeed your infant. °PUMPING BREAST MILK  °Follow the tips below for successful breast pumping. °Take care of yourself. °· Drink enough water or fluids to keep urine clear or pale yellow. You may notice a thirsty feeling while breastfeeding. This is because your body needs more water to make breast milk. Keep a large water bottle handy. Make healthy drink choices such as unsweetened fruit juice, milk and water. Limit soda, coffee, and alcohol (wait 2 hours to feed or pump if you have an alcoholic drink.) °· Eat a healthy, well-balanced diet rich in fruits, vegetables, and whole grains. °· Exercise as recommended by your caregiver. °· Get plenty of sleep. Sleep when your infant sleeps. Ask friends and family for help if you need time to nap or rest. °· Do not smoke. Smoking can lower your milk supply and harm your infant. If you need help quitting, ask your caregiver for a program recommendation. °· Ask your caregiver about birth control options. Birth control pills may lower your milk supply. You may be advised to use condoms or other forms of birth control. °Relax and pump °Stimulating your  let-down reflex is the key to successful and effective pumping. This makes the milk in all parts of the breast flow more freely.  °· It is easier to pump breast milk (and breastfeed) while you are relaxed. Find techniques that work for you. Quiet private spaces, breast massage, soothing heat placed on the breast, music, and pictures or a tape recording of your infant may help you to relax and "let down" your milk. If you have difficulty with your let down, try smelling one of your infant's blankets or an item of clothing he or she has worn while you are pumping. °· When pumping, place the special suction cup (flange) directly over the nipple. It may be uncomfortable and cause nipple damage if it is not placed properly or is the wrong size. Applying a small amount of purified or modified lanolin to your nipple and the areola may help increase your comfort level. Also, you can change the speed and suction of many electric pumps to your comfort level. Your caregiver or lactation consultant can help you with this. °· If pumping continues to be painful, or you feel you are not getting very much milk when you pump, you may need a different type of pump. A lactation consultant can help you determine if this is the case. °· If you are with your infant, feed him or her on demand and try pumping after each feeding. This will boost your production, even if milk does not come out. You may not be able   to pump much milk at first, but keep up the routine, and this will change. °· If you are working or away from your infant for several hours, try pumping for about 15 minutes every 2 to 3 hours. Pump both breasts at the same time if you can. °· If your infant has a formula feeding, make sure you pump your milk around the same time to maintain your supply. °· Begin pumping breast milk a few weeks before you return to work. This will help you develop techniques that work for you and will be able to store extra milk. °· Find a source  of breastfeeding information that works well for you. °TIPS FOR STORING BREAST MILK °· Store breast milk in a sealable sterile bag, jar, or container provided with your pumping supplies. °· Store milk in small amounts close to what your infant is drinking at each feeding. °· Cool pumped milk in a refrigerator or cooler. Pumped milk can last at the back of the refrigerator for 3 to 8 days. °· Place cooled milk at the back of the freezer for up to 3 months. °· Thaw the milk in its container or bag in warm water up to 24 hours in advance. Do not use a microwave to thaw or heat milk. Do not refreeze the milk after it has been thawed. °· Breast milk is safe to drink when left at room temperature (mid 70s or colder) for 4 to 8 hours. After that, throw it away. °· Milk fat can separate and look funny. The color can vary slightly from day to day. This is normal. Always shake the milk before using it to mix the fat with the more watery portion. °SEEK MEDICAL CARE IF:  °· You are having trouble pumping or feeding your infant. °· You are concerned that you are not making enough milk. °· You have nipple pain, soreness, or redness. °· You have other questions or concerns related to you or your infant. °Document Released: 11/16/2009 Document Revised: 08/21/2011 Document Reviewed: 11/16/2009 °ExitCare® Patient Information ©2014 [employer]. ° °Nutrition for the New Mother  °A new mother needs good health and nutrition so she can have energy to take care of a new baby. Whether a mother breastfeeds or formula feeds the baby, it is important to have a well-balanced diet. Foods from all the food groups should be chosen to meet the new mother's energy needs and to give her the nutrients needed for repair and healing.  °A HEALTHY EATING PLAN °The My Pyramid plan for Moms outlines what you should eat to help you and your baby stay healthy. The energy and amount of food you need depends on whether or not you are breastfeeding. If you  are breastfeeding you will need more nutrients. If you choose not to breastfeed, your nutrition goal should be to return to a healthy weight. Limiting calories may be needed if you are not breastfeeding.  °HOME CARE INSTRUCTIONS  °· For a personal plan based on your unique needs, see your Registered Dietitian or visit www.mypyramid.gov. °· Eat a variety of foods. The plan below will help guide you. The following chart has a suggested daily meal plan from the My Pyramid for Moms. °· Eat a variety of fruits and vegetables. °· Eat more dark green and orange vegetables and cooked dried beans. °· Make half your grains whole grains. Choose whole instead of refined grains. °· Choose low-fat or lean meats and poultry. °· Choose low-fat or fat-free dairy products   like milk, cheese, or yogurt. °Fruits °· Breastfeeding: 2 cups °· Non-Breastfeeding: 2 cups °· What Counts as a serving? °· 1 cup of fruit or juice. °· ½ cup dried fruit. °Vegetables °· Breastfeeding: 3 cups °· Non-Breastfeeding: 2 ½ cups °· What Counts as a serving? °· 1 cup raw or cooked vegetables. °· Juice or 2 cups raw leafy vegetables. °Grains °· Breastfeeding: 8 oz °· Non-Breastfeeding: 6 oz °· What Counts as a serving? °· 1 slice bread. °· 1 oz ready-to-eat cereal. °· ½ cup cooked pasta, rice, or cereal. °Meat and Beans °· Breastfeeding: 6 ½ oz °· Non-Breastfeeding: 5 ½ oz °· What Counts as a serving? °· 1 oz lean meat, poultry, or fish °· ¼ cup cooked dry beans °· ½ oz nuts or 1 egg °· 1 tbs peanut butter °Milk °· Breastfeeding: 3 cups °· Non-Breastfeeding: 3 cups °· What Counts as a serving? °· 1 cup milk. °· 8 oz yogurt. °· 1 ½ oz cheese. °· 2 oz processed cheese. °TIPS FOR THE BREASTFEEDING MOM °· Rapid weight loss is not suggested when you are breastfeeding. By simply breastfeeding, you will be able to lose the weight gained during your pregnancy. Your caregiver can keep track of your weight and tell you if your weight loss is appropriate. °· Be sure to  drink fluids. You may notice that you are thirstier than usual. A suggestion is to drink a glass of water or other beverage whenever you breastfeed. °· Avoid alcohol as it can be passed into your breast milk. °· Limit caffeine drinks to no more than 2 to 3 cups per day. °· You may need to keep taking your prenatal vitamin while you are breastfeeding. Talk with your caregiver about taking a vitamin or supplement. °RETURING TO A HEALTHY WEIGHT °· The My Pyramid Plan for Moms will help you return to a healthy weight. It will also provide the nutrients you need. °· You may need to limit "empty" calories. These include: °· High fat foods like fried foods, fatty meats, fast food, butter, and mayonnaise. °· High sugar foods like sodas, jelly, candy, and sweets. °· Be physically active. Include 30 minutes of exercise or more each day. Choose an activity you like such as walking, swimming, biking, or aerobics. Check with your caregiver before you start to exercise. °Document Released: 09/05/2007 Document Revised: 08/21/2011 Document Reviewed: 09/05/2007 °ExitCare® Patient Information ©2014 [employer]. °Postpartum Depression and Baby Blues °The postpartum period begins right after the birth of a baby. During this time, there is often a great amount of joy and excitement. It is also a time of considerable changes in the life of the parent(s). Regardless of how many times a mother gives birth, each child brings new challenges and dynamics to the family. It is not unusual to have feelings of excitement accompanied by confusing shifts in moods, emotions, and thoughts. All mothers are at risk of developing postpartum depression or the "baby blues." These mood changes can occur right after giving birth, or they may occur many months after giving birth. The baby blues or postpartum depression can be mild or severe. Additionally, postpartum depression can resolve rather quickly, or it can be a long-term  condition. °CAUSES °Elevated hormones and their rapid decline are thought to be a main cause of postpartum depression and the baby blues. There are a number of hormones that radically change during and after pregnancy. Estrogen and progesterone usually decrease immediately after delivering your baby. The level of thyroid hormone and   various cortisol steroids also rapidly drop. Other factors that play a major role in these changes include major life events and genetics.  °RISK FACTORS °If you have any of the following risks for the baby blues or postpartum depression, know what symptoms to watch out for during the postpartum period. Risk factors that may increase the likelihood of getting the baby blues or postpartum depression include: °· Having a personal or family history of depression. °· Having depression while being pregnant. °· Having premenstrual or oral contraceptive-associated mood issues. °· Having exceptional life stress. °· Having marital conflict. °· Lacking a social support network. °· Having a baby with special needs. °· Having health problems such as diabetes. °SYMPTOMS °Baby blues symptoms include: °· Brief fluctuations in mood, such as going from extreme happiness to sadness. °· Decreased concentration. °· Difficulty sleeping. °· Crying spells, tearfulness. °· Irritability. °· Anxiety. °Postpartum depression symptoms typically begin within the first month after giving birth. These symptoms include: °· Difficulty sleeping or excessive sleepiness. °· Marked weight loss. °· Agitation. °· Feelings of worthlessness. °· Lack of interest in activity or food. °Postpartum psychosis is a very concerning condition and can be dangerous. Fortunately, it is rare. Displaying any of the following symptoms is cause for immediate medical attention. Postpartum psychosis symptoms include: °· Hallucinations and delusions. °· Bizarre or disorganized behavior. °· Confusion or disorientation. °DIAGNOSIS  °A diagnosis is  made by an evaluation of your symptoms. There are no medical or lab tests that lead to a diagnosis, but there are various questionnaires that a caregiver may use to identify those with the baby blues, postpartum depression, or psychosis. Often times, a screening tool called the Edinburgh Postnatal Depression Scale is used to diagnose depression in the postpartum period.  °TREATMENT °The baby blues usually goes away on its own in 1 to 2 weeks. Social support is often all that is needed. You should be encouraged to get adequate sleep and rest. Occasionally, you may be given medicines to help you sleep.  °Postpartum depression requires treatment as it can last several months or longer if it is not treated. Treatment may include individual or group therapy, medicine, or both to address any social, physiological, and psychological factors that may play a role in the depression. Regular exercise, a healthy diet, rest, and social support may also be strongly recommended.  °Postpartum psychosis is more serious and needs treatment right away. Hospitalization is often needed. °HOME CARE INSTRUCTIONS °· Get as much rest as you can. Nap when the baby sleeps. °· Exercise regularly. Some women find yoga and walking to be beneficial. °· Eat a balanced and nourishing diet. °· Do little things that you enjoy. Have a cup of tea, take a bubble bath, read your favorite magazine, or listen to your favorite music. °· Avoid alcohol. °· Ask for help with household chores, cooking, grocery shopping, or running errands as needed. Do not try to do everything. °· Talk to people close to you about how you are feeling. Get support from your partner, family members, friends, or other new moms. °· Try to stay positive in how you think. Think about the things you are grateful for. °· Do not spend a lot of time alone. °· Only take medicine as directed by your caregiver. °· Keep all your postpartum appointments. °· Let your caregiver know if you have  any concerns. °SEEK MEDICAL CARE IF: °You are having a reaction or problems with your medicine. °SEEK IMMEDIATE MEDICAL CARE IF: °· You have suicidal   feelings. °· You feel you may harm the baby or someone else. °Document Released: 03/02/2004 Document Revised: 08/21/2011 Document Reviewed: 03/10/2013 °ExitCare® Patient Information ©2014 [employer]. ° °

## 2013-07-17 NOTE — Progress Notes (Signed)
Patient ID: Yvonne SmallingJanine Ali, female   DOB: 01/02/1981, 33 y.o.   MRN: [id number] Post Partum Day #2            Information for the patient's newborn:  Yvonne Ali, Yvonne Ali [[id number]]  female Feeding: breast  Subjective: No HA, SOB, CP, F/C, breast symptoms. Pain well-controlled with ibuprofen. Normal vaginal bleeding, no clots.      Objective:  Temp:  [98 F (36.7 C)-98.4 F (36.9 C)] 98.4 F (36.9 C) (02/05 0548) Pulse Rate:  [88-103] 92 (02/05 0655) Resp:  [20] 20 (02/05 0548) BP: (117-179)/(74-98) 117/74 mmHg (02/05 0655)  No intake or output data in the 24 hours ending 07/17/13 0915     Recent Labs  07/14/13 1855 07/16/13 0500  WBC 9.1 14.5*  HGB 11.2* 8.4*  HCT 33.6* 25.2*  PLT 228 208    Blood type: --/--/B POS (02/02 0920) Rubella: 4.87 (07/01 1015)    Physical Exam:  General: alert, cooperative and no distress Uterine Fundus: firm, midline, U-2 Lochia: appropriate Perineum: sulcus repair healing well, edema no DVT Evaluation: No evidence of DVT seen on physical exam. Negative Homan's sign. No cords or calf tenderness. Calf/Ankle trace edema is present.   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

## 2013-07-17 NOTE — Progress Notes (Signed)
Patient BP at 0548 179/81. Recheck BP at 0655 while patient resting on her left side 117/74.

## 2013-07-21 ENCOUNTER — Telehealth: Payer: Self-pay | Admitting: Hematology and Oncology

## 2013-07-21 NOTE — Telephone Encounter (Signed)
returned pt call and advised that the reason why she did not have an appt is Dr. Bertis RuddyGorsuch stated that pt did not need return visit.Marland Kitchen..Marland Kitchen

## 2013-07-22 ENCOUNTER — Telehealth: Payer: Self-pay | Admitting: Hematology and Oncology

## 2013-07-22 ENCOUNTER — Observation Stay (HOSPITAL_COMMUNITY)
Admission: AD | Admit: 2013-07-22 | Discharge: 2013-07-24 | Disposition: A | Discharge status: Home / Self Care | Payer: 59 | Source: Ambulatory Visit | Attending: Obstetrics and Gynecology | Admitting: Obstetrics and Gynecology

## 2013-07-22 ENCOUNTER — Inpatient Hospital Stay (HOSPITAL_COMMUNITY): Payer: 59

## 2013-07-22 ENCOUNTER — Encounter (HOSPITAL_COMMUNITY): Payer: Self-pay

## 2013-07-22 ENCOUNTER — Telehealth: Payer: Self-pay | Admitting: *Deleted

## 2013-07-22 DIAGNOSIS — E669 Obesity, unspecified: Secondary | ICD-10-CM | POA: Insufficient documentation

## 2013-07-22 DIAGNOSIS — Z87891 Personal history of nicotine dependence: Secondary | ICD-10-CM | POA: Insufficient documentation

## 2013-07-22 DIAGNOSIS — O99215 Obesity complicating the puerperium: Secondary | ICD-10-CM

## 2013-07-22 DIAGNOSIS — O135 Gestational [pregnancy-induced] hypertension without significant proteinuria, complicating the puerperium: Principal | ICD-10-CM | POA: Insufficient documentation

## 2013-07-22 DIAGNOSIS — Z86711 Personal history of pulmonary embolism: Secondary | ICD-10-CM | POA: Insufficient documentation

## 2013-07-22 DIAGNOSIS — O165 Unspecified maternal hypertension, complicating the puerperium: Secondary | ICD-10-CM | POA: Diagnosis present

## 2013-07-22 LAB — COMPREHENSIVE METABOLIC PANEL
ALT: 55 U/L — ABNORMAL HIGH (ref 0–35)
AST: 31 U/L (ref 0–37)
Albumin: 2.5 g/dL — ABNORMAL LOW (ref 3.5–5.2)
Alkaline Phosphatase: 73 U/L (ref 39–117)
BUN: 10 mg/dL (ref 6–23)
CO2: 24 mEq/L (ref 19–32)
Calcium: 8.6 mg/dL (ref 8.4–10.5)
Chloride: 103 mEq/L (ref 96–112)
Creatinine, Ser: 1.07 mg/dL (ref 0.50–1.10)
GFR calc Af Amer: 80 mL/min — ABNORMAL LOW (ref >90)
GFR calc non Af Amer: 69 mL/min — ABNORMAL LOW (ref >90)
Glucose, Bld: 92 mg/dL (ref 70–99)
Potassium: 3.9 mEq/L (ref 3.7–5.3)
Sodium: 139 mEq/L (ref 137–147)
Total Bilirubin: 0.3 mg/dL (ref 0.3–1.2)
Total Protein: 6.5 g/dL (ref 6.0–8.3)

## 2013-07-22 LAB — URIC ACID: Uric Acid, Serum: 7.5 mg/dL — ABNORMAL HIGH (ref 2.4–7.0)

## 2013-07-22 LAB — CBC
HCT: 29.3 % — ABNORMAL LOW (ref 36.0–46.0)
Hemoglobin: 9.6 g/dL — ABNORMAL LOW (ref 12.0–15.0)
MCH: 27.8 pg (ref 26.0–34.0)
MCHC: 32.8 g/dL (ref 30.0–36.0)
MCV: 84.9 fL (ref 78.0–100.0)
Platelets: 374 10*3/uL (ref 150–400)
RBC: 3.45 MIL/uL — ABNORMAL LOW (ref 3.87–5.11)
RDW: 15.6 % — ABNORMAL HIGH (ref 11.5–15.5)
WBC: 8.7 10*3/uL (ref 4.0–10.5)

## 2013-07-22 MED ORDER — LABETALOL HCL 200 MG PO TABS
200.0000 mg | ORAL_TABLET | Freq: Two times a day (BID) | ORAL | Status: DC
  Administered 2013-07-23 – 2013-07-24 (×3): 200 mg via ORAL
  Filled 2013-07-22 (×5): qty 1

## 2013-07-22 MED ORDER — LABETALOL HCL 100 MG PO TABS
200.0000 mg | ORAL_TABLET | Freq: Two times a day (BID) | ORAL | Status: DC
  Filled 2013-07-22 (×2): qty 2

## 2013-07-22 MED ORDER — LABETALOL HCL 100 MG PO TABS
200.0000 mg | ORAL_TABLET | Freq: Once | ORAL | Status: AC
  Administered 2013-07-22: 200 mg via ORAL
  Filled 2013-07-22: qty 2

## 2013-07-22 MED ORDER — HYDROCHLOROTHIAZIDE 50 MG PO TABS
50.0000 mg | ORAL_TABLET | Freq: Every day | ORAL | Status: DC
  Administered 2013-07-22: 50 mg via ORAL
  Filled 2013-07-22 (×2): qty 1

## 2013-07-22 MED ORDER — HYDRALAZINE HCL 20 MG/ML IJ SOLN
5.0000 mg | Freq: Once | INTRAMUSCULAR | Status: AC
  Administered 2013-07-22: 5 mg via INTRAVENOUS
  Filled 2013-07-22: qty 1

## 2013-07-22 MED ORDER — HYDROCHLOROTHIAZIDE 50 MG PO TABS
50.0000 mg | ORAL_TABLET | Freq: Every day | ORAL | Status: DC
  Administered 2013-07-23 – 2013-07-24 (×2): 50 mg via ORAL
  Filled 2013-07-22 (×3): qty 1

## 2013-07-22 MED ORDER — ENOXAPARIN SODIUM 80 MG/0.8ML ~~LOC~~ SOLN
80.0000 mg | Freq: Every day | SUBCUTANEOUS | Status: DC
  Administered 2013-07-23 – 2013-07-24 (×2): 80 mg via SUBCUTANEOUS
  Filled 2013-07-22 (×3): qty 0.8

## 2013-07-22 MED ORDER — HYDRALAZINE HCL 20 MG/ML IJ SOLN
5.0000 mg | Freq: Once | INTRAMUSCULAR | Status: AC
  Administered 2013-07-23: 5 mg via INTRAVENOUS

## 2013-07-22 MED ORDER — LACTATED RINGERS IV SOLN
INTRAVENOUS | Status: DC
  Administered 2013-07-22: 18:00:00 via INTRAVENOUS

## 2013-07-22 NOTE — MAU Provider Note (Signed)
History     CSN: [id number]  Arrival date and time: 07/22/13 1552   First Provider Initiated Contact with Patient 07/22/13 1706      Chief Complaint  Patient presents with  . Shortness of Breath   HPI 33 yo G2P1011 BF s/p SVD 2/3 with known PIH on labetalol sent from office after evaluation done for c/o increased leg swelling and  SOB for 2-3 days. Denies h/a, visual change or epigastric pain. On Lovenox for hx PE. No CP or palpitation/DOE Evaluation here showed elevated BP's. Labs showed nl plt count, mildly elevated ALT, uric acid 7.5. CXR negative for pulmonary edema.  OB History   Grav Para Term Preterm Abortions TAB SAB Ect Mult Living   2 1 1  1   1  1       Past Medical History  Diagnosis Date  . Ectopic pregnancy   . Pregnancy induced hypertension   . PE (pulmonary embolism)   . Postpartum care following vaginal delivery (2/2) 07/15/2013    Past Surgical History  Procedure Laterality Date  . Hip surgery    . Bariatric surgery      gastric sleeve    Family History  Problem Relation Age of Onset  . Hypertension Maternal Grandfather   . Hyperlipidemia Maternal Grandfather   . Cancer Paternal Grandmother [id number]    ovarian cancer  . Diabetes Paternal Grandmother     History  Substance Use Topics  . Smoking status: Former Smoker    Quit date: 05/12/2006  . Smokeless tobacco: Never Used  . Alcohol Use: Yes     Comment: stopped with positive UPT    Allergies: No Known Allergies  Prescriptions prior to admission  Medication Sig Dispense Refill  . enoxaparin (LOVENOX) 80 MG/0.8ML injection Inject 80 mg into the skin daily.      . ferrous sulfate 325 (65 FE) MG tablet Take 1 tablet (325 mg total) by mouth 2 (two) times daily with a meal.  60 tablet  3  . ibuprofen (ADVIL,MOTRIN) 600 MG tablet Take 1 tablet (600 mg total) by mouth every 6 (six) hours.  30 tablet  0  . labetalol (NORMODYNE) 100 MG tablet Take 1 tablet (100 mg total) by mouth 2 (two) times daily.  60  tablet  3  . oxyCODONE-acetaminophen (PERCOCET/ROXICET) 5-325 MG per tablet Take 1-2 tablets by mouth every 6 (six) hours as needed for severe pain (moderate - severe pain).  30 tablet  0  . pantoprazole (PROTONIX) 40 MG tablet Take 1 tablet (40 mg total) by mouth daily.  30 tablet  11  . Prenatal Vit-Fe Fumarate-FA (PRENATAL MULTIVITAMIN) TABS tablet Take 1 tablet by mouth daily at 12 noon.      Marland Kitchen. acetaminophen (TYLENOL) 500 MG tablet Take 1,000 mg by mouth every 6 (six) hours as needed for headache.        Review of Systems  Eyes: Negative for blurred vision.  Respiratory: Positive for shortness of breath.   Cardiovascular: Positive for leg swelling. Negative for chest pain and palpitations.  Gastrointestinal: Negative for heartburn.  Neurological: Negative for headaches.   Physical Exam   Blood pressure 186/112, pulse 105, temperature 98.1 F (36.7 C), resp. rate 22, height 5\' 10"  (1.778 m), weight 161.027 kg (355 lb), last menstrual period 10/17/2012, SpO2 95.00%, currently breastfeeding.  Physical Exam  Constitutional: She is oriented to person, place, and time. She appears well-developed and well-nourished. No distress.  HENT:  Head: Atraumatic.  Neck: Neck supple.  Cardiovascular: Regular rhythm.   Respiratory: Effort normal and breath sounds normal. She has no wheezes. She has no rales.  GI: Soft.  Musculoskeletal: She exhibits edema.  3+  Neurological: She is alert and oriented to person, place, and time.  Psychiatric: She has a normal mood and affect.    MAU Course  Procedures  MDM   Assessment and Plan  Postpartum HTN Morbid obesity Hx PE P) admit cont Lovenox. Increase labetalol. Add HCTZ. Apresoline prn  Yvonne Ali A 07/22/2013, 5:15 PM

## 2013-07-22 NOTE — Progress Notes (Signed)
To 303 via w/c.

## 2013-07-22 NOTE — Progress Notes (Signed)
Arlana LindauJulie Fisher NP notified of pt's admission and status. Aware of v/s with elevated B/Ps, 02 sats, lungs clear. Orders received.

## 2013-07-22 NOTE — MAU Note (Signed)
Feet and legs edematous to knees. 3+ pitting. Denies h/a, n/v. Lungs clear.

## 2013-07-22 NOTE — Addendum Note (Signed)
Addendum created 07/22/13 2332 by Armanda Heritageharlesetta M Khristine Verno, CRNA   Modules edited: Anesthesia LDA

## 2013-07-22 NOTE — Telephone Encounter (Signed)
From prior visit, I recommend her to continue Lovenox for 4-6 weeks after delivery to prevent DVT

## 2013-07-22 NOTE — Telephone Encounter (Signed)
Instructed pt she may stop Lovenox 4 weeks post delivery and she does not need to come for f/u visit w/ Dr. Bertis RuddyGorsuch.  She verbalized understanding.

## 2013-07-22 NOTE — Progress Notes (Signed)
Dr Cherly Hensenousins notified of lab results and B/Ps. Will admit to [facility]. Orders received

## 2013-07-22 NOTE — MAU Note (Signed)
Allyson SabalMaggie Collins CRNA in to start IV

## 2013-07-22 NOTE — MAU Note (Signed)
Labs drawn and pt to xray via w/c

## 2013-07-22 NOTE — Telephone Encounter (Signed)
Pt left Vm states she was instructed to make appt to see Dr. Bertis RuddyGorsuch to find out when she can stop her lovenox.

## 2013-07-22 NOTE — MAU Note (Signed)
Have had SOB last 2-3 days and retaining a lot of fld. Have hx of PE and PIH

## 2013-07-22 NOTE — MAU Note (Signed)
[facility]

## 2013-07-22 NOTE — MAU Note (Signed)
Attempted IV start x 1 without success. Maggie CRNA called to start IV. Pt and MD aware

## 2013-07-22 NOTE — Telephone Encounter (Signed)
returned pt call and advised to call back with reason for call

## 2013-07-23 MED ORDER — HYDRALAZINE HCL 20 MG/ML IJ SOLN
INTRAMUSCULAR | Status: AC
  Filled 2013-07-23: qty 1

## 2013-07-23 MED ORDER — IBUPROFEN 800 MG PO TABS
800.0000 mg | ORAL_TABLET | Freq: Three times a day (TID) | ORAL | Status: DC | PRN
  Administered 2013-07-23 – 2013-07-24 (×3): 800 mg via ORAL
  Filled 2013-07-23 (×3): qty 1

## 2013-07-23 MED ORDER — HYDRALAZINE HCL 10 MG PO TABS
10.0000 mg | ORAL_TABLET | Freq: Four times a day (QID) | ORAL | Status: DC
  Administered 2013-07-23 – 2013-07-24 (×3): 10 mg via ORAL
  Filled 2013-07-23 (×7): qty 1

## 2013-07-23 MED ORDER — LABETALOL HCL 5 MG/ML IV SOLN
20.0000 mg | Freq: Once | INTRAVENOUS | Status: AC
  Administered 2013-07-23: 20 mg via INTRAVENOUS
  Filled 2013-07-23: qty 4

## 2013-07-23 MED ORDER — IBUPROFEN 100 MG/5ML PO SUSP
800.0000 mg | Freq: Three times a day (TID) | ORAL | Status: DC | PRN
  Filled 2013-07-23: qty 40

## 2013-07-23 MED ORDER — HYDRALAZINE HCL 20 MG/ML IJ SOLN
5.0000 mg | Freq: Once | INTRAMUSCULAR | Status: AC
  Administered 2013-07-23: 5 mg via INTRAVENOUS

## 2013-07-23 MED ORDER — HYDRALAZINE HCL 20 MG/ML IJ SOLN
10.0000 mg | Freq: Once | INTRAMUSCULAR | Status: AC
  Administered 2013-07-23: 10 mg via INTRAVENOUS
  Filled 2013-07-23: qty 1

## 2013-07-23 NOTE — Progress Notes (Signed)
HD #2  S: feels better. IV out  VS: BP 136/61 97.5 P 110 (BP 164-183/103-114)  Lungs clear to A Cor sl tachycardia Abd: obese soft nontender Extr: 1+ left leg edema 1-2+ right  Pitting edema  Outflow: 1900 cc urine output IMP: PP HTN on labetalol/hctz doing better P) cont with present medication/diuresing

## 2013-07-23 NOTE — Progress Notes (Signed)
Ur chart review completed.  

## 2013-07-24 LAB — T4, FREE: FREE T4: 1.72 ng/dL (ref 0.80–1.80)

## 2013-07-24 LAB — TSH: TSH: 0.684 u[IU]/mL (ref 0.350–4.500)

## 2013-07-24 MED ORDER — HYDROCHLOROTHIAZIDE 50 MG PO TABS: 50.0000 mg | ORAL_TABLET | Freq: Every day | ORAL | Status: DC

## 2013-07-24 MED ORDER — HYDRALAZINE HCL 10 MG PO TABS: 10.0000 mg | ORAL_TABLET | Freq: Four times a day (QID) | ORAL | Status: DC

## 2013-07-24 MED ORDER — LABETALOL HCL 200 MG PO TABS: 200.0000 mg | ORAL_TABLET | Freq: Two times a day (BID) | ORAL | Status: DC

## 2013-07-24 NOTE — Discharge Instructions (Signed)
Preeclampsia warning signs  

## 2013-07-24 NOTE — Discharge Summary (Signed)
Physician Discharge Summary  Patient ID: Yvonne SmallingJanine Ali MRN: [id number] DOB/AGE: 32/10/1980 32 y.o.  Admit date: 07/22/2013 Discharge date: 07/24/2013  Admission Diagnoses: Postpartum hypertension, morbid obesity Hx PE anticoagulated  Discharge Diagnoses: postpartum hypertension, morbid obesity, hx PE anticoagulated  Active Problems:   Postpartum hypertension   Discharged Condition: stable  Hospital Course: Pt was admitted for elevated BP. Her labetalol dose was increased. Added HCTZ. Intermittent IV apresoline was given. Hydrazaline (oral) was added on. BP improved. PIH labs had only increased uric acid and isolated ALT. Pt lost 20 lb overnight  Consults: None  Significant Diagnostic Studies: labs:  CBC    Component Value Date/Time   WBC 8.7 07/22/2013 1634   RBC 3.45* 07/22/2013 1634   HGB 9.6* 07/22/2013 1634   HCT 29.3* 07/22/2013 1634   PLT 374 07/22/2013 1634   MCV 84.9 07/22/2013 1634   MCH 27.8 07/22/2013 1634   MCHC 32.8 07/22/2013 1634   RDW 15.6* 07/22/2013 1634   LYMPHSABS 1.9 12/10/2012 1015   MONOABS 0.5 12/10/2012 1015   EOSABS 0.1 12/10/2012 1015   BASOSABS 0.0 12/10/2012 1015    CMP     Component Value Date/Time   NA 139 07/22/2013 1634   K 3.9 07/22/2013 1634   CL 103 07/22/2013 1634   CO2 24 07/22/2013 1634   GLUCOSE 92 07/22/2013 1634   BUN 10 07/22/2013 1634   CREATININE 1.07 07/22/2013 1634   CREATININE 0.89 07/02/2013 0635   CREATININE 0.82 09/03/2012 0956   CALCIUM 8.6 07/22/2013 1634   PROT 6.5 07/22/2013 1634   ALBUMIN 2.5* 07/22/2013 1634   AST 31 07/22/2013 1634   ALT 55* 07/22/2013 1634   ALKPHOS 73 07/22/2013 1634   BILITOT 0.3 07/22/2013 1634   GFRNONAA 69* 07/22/2013 1634   GFRAA 80* 07/22/2013 1634       Treatments: HTN meds    Discharge Exam: Blood pressure 144/93, pulse 105, temperature 98.3 F (36.8 C), temperature source Oral, resp. rate 18, height 5\' 10"  (1.778 m), weight 148.099 kg (326 lb 8 oz), SpO2 100.00%, currently breastfeeding. General  appearance: alert, cooperative and no distress Resp: clear to auscultation bilaterally Cardio: regular rate and rhythm, S1, S2 normal, no murmur, click, rub or gallop Extremities: edema trace( right leg), 1+ ankle(leg)  Disposition: 01-Home or Self Care  Discharge Orders   Future Orders Complete By Expires   Discharge patient  As directed        Medication List         enoxaparin 80 MG/0.8ML injection  Commonly known as:  LOVENOX  Inject 80 mg into the skin daily.     ferrous sulfate 325 (65 FE) MG tablet  Take 1 tablet (325 mg total) by mouth 2 (two) times daily with a meal.     hydrALAZINE 10 MG tablet  Commonly known as:  APRESOLINE  Take 1 tablet (10 mg total) by mouth every 6 (six) hours.     hydrochlorothiazide 50 MG tablet  Commonly known as:  HYDRODIURIL  Take 1 tablet (50 mg total) by mouth daily.     ibuprofen 600 MG tablet  Commonly known as:  ADVIL,MOTRIN  Take 1 tablet (600 mg total) by mouth every 6 (six) hours.     labetalol 200 MG tablet  Commonly known as:  NORMODYNE  Take 1 tablet (200 mg total) by mouth 2 (two) times daily.     oxyCODONE-acetaminophen 5-325 MG per tablet  Commonly known as:  PERCOCET/ROXICET  Take 1-2 tablets by mouth  every 6 (six) hours as needed for severe pain (moderate - severe pain).     pantoprazole 40 MG tablet  Commonly known as:  PROTONIX  Take 1 tablet (40 mg total) by mouth daily.     prenatal multivitamin Tabs tablet  Take 1 tablet by mouth daily at 12 noon.           Follow-up Information   Follow up with Bryssa Tones A, MD On 07/28/2013. (BP check)    Specialty:  Obstetrics and Gynecology   Contact information:   [address] [place] [address] [phone]       Signed: Maxie Better A 07/24/2013, 1:40 PM

## 2013-07-24 NOTE — Progress Notes (Signed)
HD #3 PP HTN   Feels well Ready for discharge 20lb weight loss  O: BP 144/93  Lungs: clear to A Cor RRR Abdomen: obese, soft nontender Extr: trace  Edema right leg no calf tenderness Left leg: 1+ ankle edema  IMP: PP HTN improved on labetalol, hydralazine, hctz P) d/c home. Cont med. BP check office 2/16. Call if sx recurs

## 2013-07-24 NOTE — Progress Notes (Signed)
Pt discharged home with family... Condition stable... No equipment... Ambulated to car with Stevphen MeuseV. Pittman, NT.

## 2014-04-13 ENCOUNTER — Encounter (HOSPITAL_COMMUNITY): Payer: Self-pay

## 2014-05-29 IMAGING — CR DG CHEST 2V
2 series · 2 of 2 positions shown · non-contrast
Comparison: None.

CLINICAL DATA: Recent vaginal delivery and shortness of breath.

EXAM:
CHEST  2 VIEW

[view not recorded (1 of 2)]
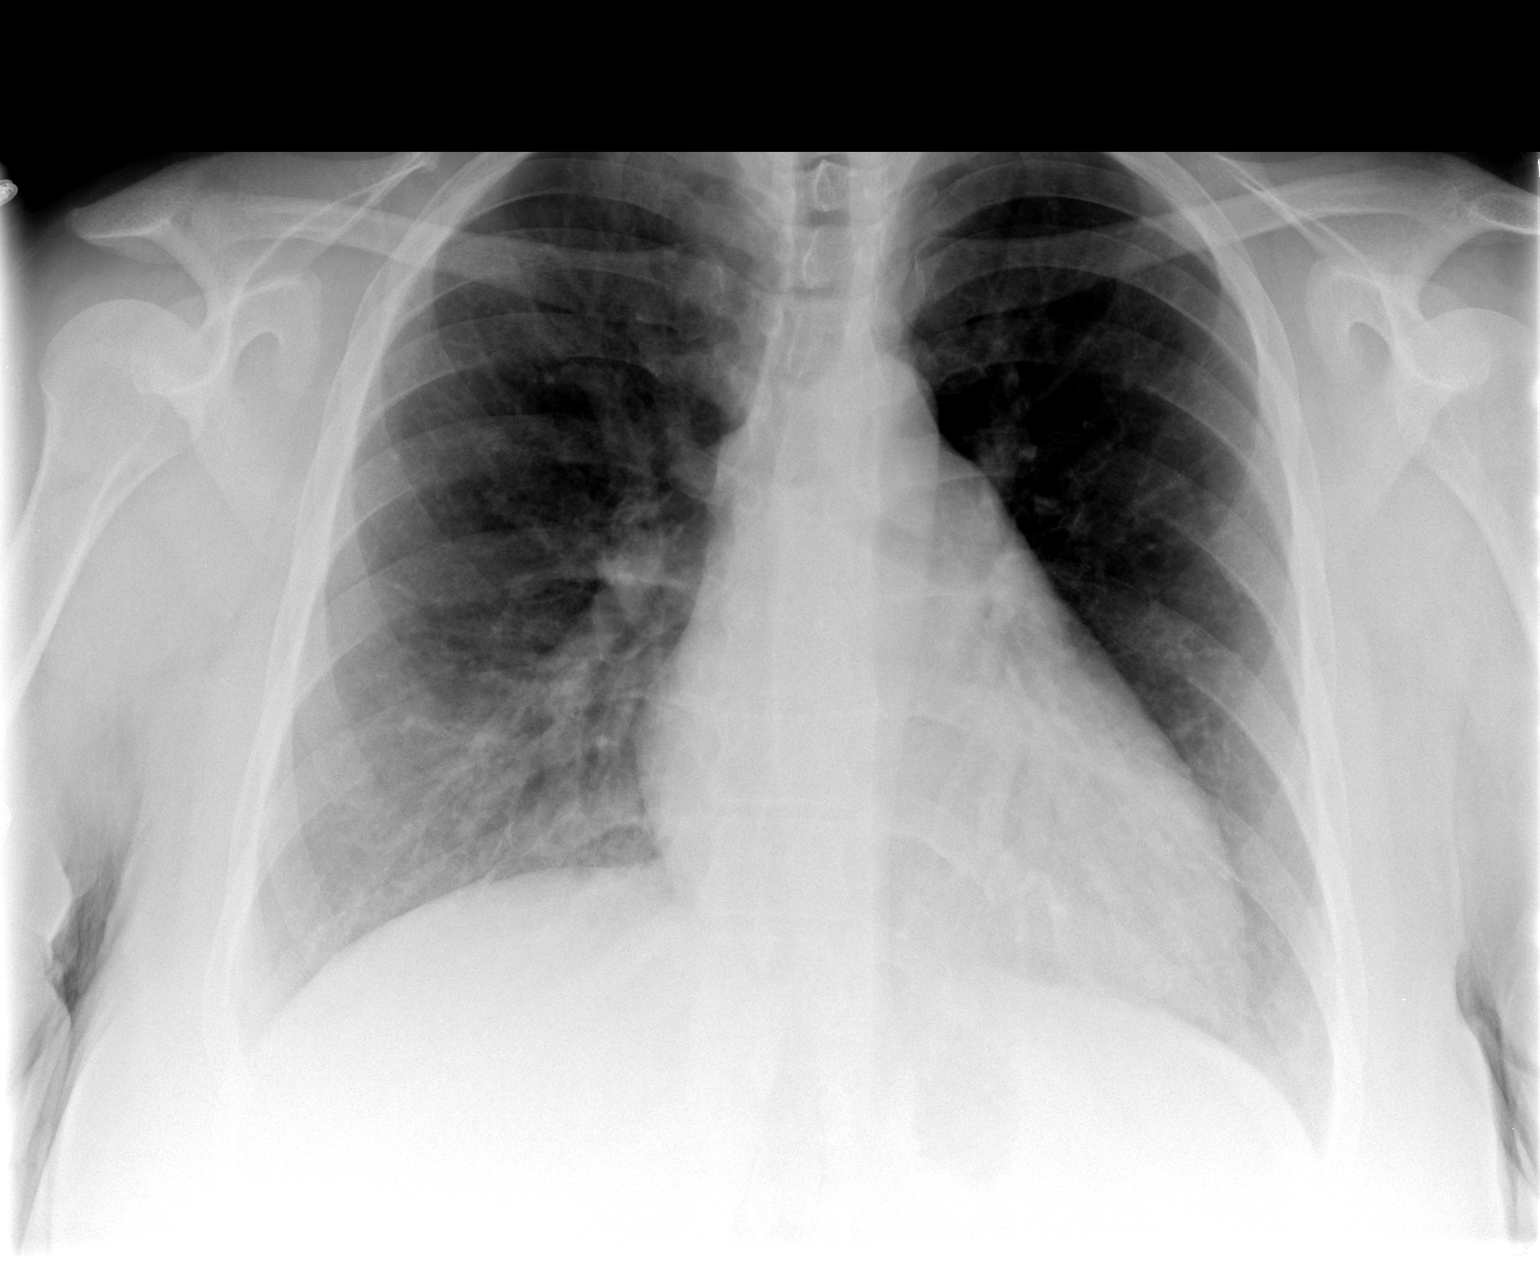

[view not recorded (2 of 2)]
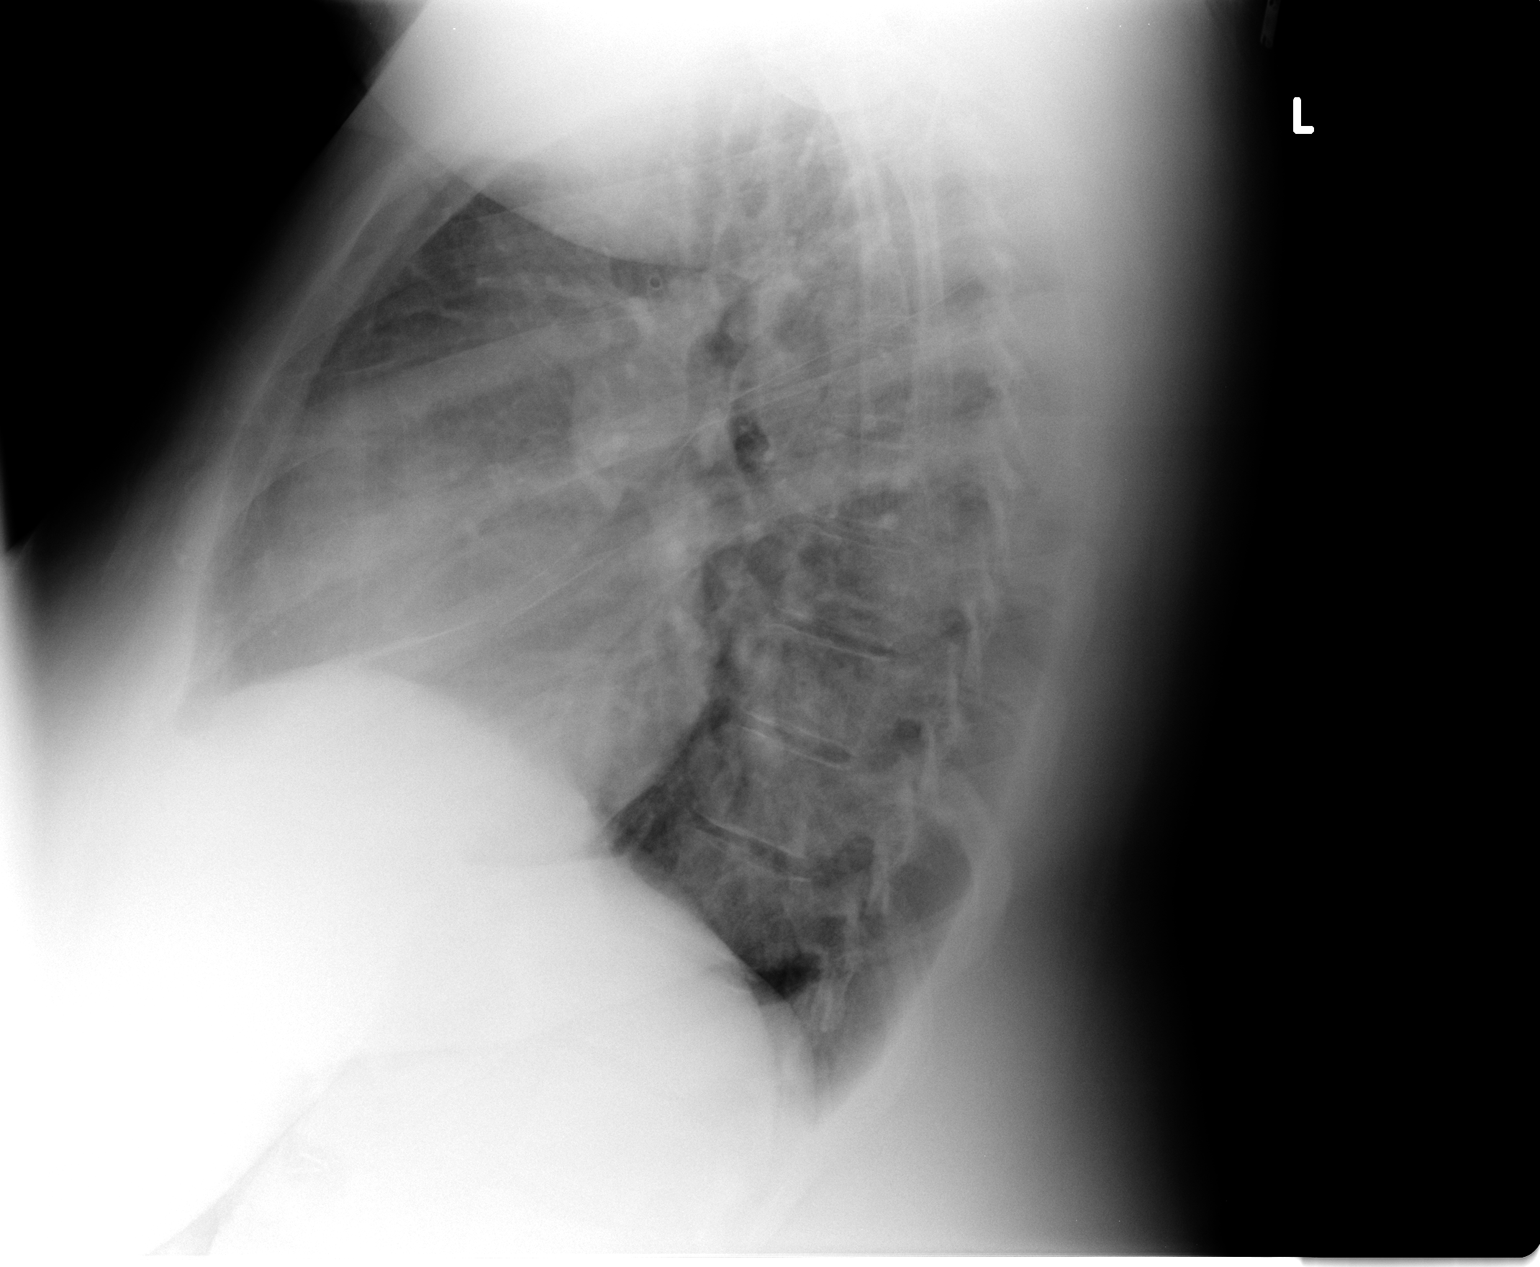

[2 of 2 positions shown; findings below may reference images not displayed]

FINDINGS: Two views of the chest demonstrate mild fullness of the vascular
structures but no evidence for overt pulmonary edema. Heart size is
normal. No evidence for pleural effusions. There may be a trace
amount of fluid or thickened along the pleural fissures.
IMPRESSION: There may be mild vascular congestion related to recent pregnancy
but no focal lung disease.

## 2014-06-12 NOTE — L&D Delivery Note (Signed)
Delivery Note At 8:02 PM a live healthy female was delivered via Vaginal, Spontaneous Delivery (Presentation: Left Occiput Anterior).  APGAR: 8, 9; weight 8 lb 12 oz (3969 g).   Placenta status: Intact, Spontaneous.  Not sent Cord: 3 vessels with the following complications: None.  Cord WU:JWJX  Anesthesia: Epidural  Episiotomy: None Lacerations: None Suture Repair: none Est. Blood Loss (mL): 300  Mom  To postpartum   Baby to couplet/skin to skin.  Catina Nuss A 02/06/2015, 9:27 AM

## 2014-07-02 ENCOUNTER — Telehealth: Payer: Self-pay | Admitting: Hematology and Oncology

## 2014-07-02 NOTE — Telephone Encounter (Signed)
S/W PATIENT AND GAVE FOR 02/22 @ 3 W/DR. GORSUCH.

## 2014-07-07 ENCOUNTER — Telehealth: Payer: Self-pay | Admitting: Hematology

## 2014-07-07 NOTE — Telephone Encounter (Signed)
CALLED PATIENT AND GAVE APPT FOR 02/04 @ 8:30 W/DR. FENG. FORMER PATIENT OF DR. Bertis RuddyGORSUCH PATIENT [redacted]WKS PREGNANT ON LOVENOX AND NEDS TO CHANGE TO HEPARIN PRIOR TO DELIVERY.

## 2014-07-09 ENCOUNTER — Encounter: Payer: Self-pay | Admitting: Hematology & Oncology

## 2014-07-09 ENCOUNTER — Telehealth: Payer: Self-pay | Admitting: Hematology & Oncology

## 2014-07-09 NOTE — Telephone Encounter (Signed)
Left vm w w NEW PATIENT today to remind them of their appointment with Dr. Myna HidalgoEnnever. Also, advised them to bring all medication bottles and insurance card information.

## 2014-07-10 ENCOUNTER — Other Ambulatory Visit: Payer: Self-pay | Admitting: Family

## 2014-07-10 ENCOUNTER — Encounter: Payer: Self-pay | Admitting: Family

## 2014-07-10 ENCOUNTER — Other Ambulatory Visit (HOSPITAL_BASED_OUTPATIENT_CLINIC_OR_DEPARTMENT_OTHER): Payer: 59 | Admitting: Lab

## 2014-07-10 ENCOUNTER — Ambulatory Visit (HOSPITAL_BASED_OUTPATIENT_CLINIC_OR_DEPARTMENT_OTHER): Payer: 59 | Admitting: Family

## 2014-07-10 ENCOUNTER — Ambulatory Visit: Payer: 59

## 2014-07-10 VITALS — BP 147/84 | HR 84 | Temp 97.7°F | Resp 18 | Ht 68.0 in | Wt 352.0 lb

## 2014-07-10 DIAGNOSIS — Z86711 Personal history of pulmonary embolism: Secondary | ICD-10-CM

## 2014-07-10 DIAGNOSIS — O9989 Other specified diseases and conditions complicating pregnancy, childbirth and the puerperium: Secondary | ICD-10-CM

## 2014-07-10 MED ORDER — ENOXAPARIN SODIUM 80 MG/0.8ML ~~LOC~~ SOLN
80.0000 mg | SUBCUTANEOUS | Status: DC
Start: 2014-07-10 — End: 2018-01-24

## 2014-07-10 NOTE — Progress Notes (Signed)
Hematology/Oncology Consultation   Name: Yvonne Ali      MRN: [id number]    Location: Room/bed info not found  Date: 07/10/2014 Time:2:57 PM   REFERRING PHYSICIAN:  Sheronette Cousins   REASON FOR CONSULT:  History of PE, [redacted] weeks pregnant   DIAGNOSIS: 1. History of PE in 2003 2. Ectopic pregnancy 2011 3. Currently [redacted] weeks pregnant, was treated with Lovenox during her last pregnancy  HISTORY OF PRESENT ILLNESS: Yvonne Ali is a very pleasant 34 yo African American with a history of PE in 2003. At the time, she was a smoker and also on birth control. She was treated initially with heparin and then transitioned to Coumadin. She was on Coumadin for 1 year with no bleeding or complications.  She had an ectopic pregnancy in 2011. She now has a beautiful 3311 month old daughter and is [redacted] weeks pregnant. Her pregnancy with her daughter was relatively uncomplicated. She was followed by Dr. Bertis RuddyGorsuch and on 80 mg Lovenox daily throughout the pregnancy. She developed pre-eclampsia 6 weeks before delivery and only required antihypertensives for a short time before it resolved.  She has no personal or familial history of cancer, sickle cell, anemia or bleeding disorder.  No family members with history of blood clots.  She denies fever, chills, n/v, cough, rash, headache, dizziness, SOB, chest pain, palpitations, abdominal pain, constipation, diarrhea, blood in urine or stool.  No swelling, tenderness, numbness or tingling in her extremities. Her appetite is good and she is staying hydrated. Her weight is stable at 352 lbs.  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   ROS: All other 10 point review of systems is negative.   PAST MEDICAL HISTORY:   Past Medical History  Diagnosis Date  . Ectopic pregnancy   . Pregnancy induced hypertension   . PE (pulmonary embolism)   . Postpartum care following vaginal delivery (2/2) 07/15/2013    ALLERGIES: No Known Allergies    MEDICATIONS:  Current Outpatient  Prescriptions on File Prior to Visit  Medication Sig Dispense Refill  . Prenatal Vit-Fe Fumarate-FA (PRENATAL MULTIVITAMIN) TABS tablet Take 1 tablet by mouth daily at 12 noon.     No current facility-administered medications on file prior to visit.     PAST SURGICAL HISTORY Past Surgical History  Procedure Laterality Date  . Hip surgery    . Bariatric surgery      gastric sleeve    FAMILY HISTORY: Family History  Problem Relation Age of Onset  . Hypertension Maternal Grandfather   . Hyperlipidemia Maternal Grandfather   . Cancer Paternal Grandmother [id number]    ovarian cancer  . Diabetes Paternal Grandmother     SOCIAL HISTORY:  reports that she quit smoking about 8 years ago. She has never used smokeless tobacco. She reports that she drinks alcohol. She reports that she does not use illicit drugs.  PERFORMANCE STATUS: The patient's performance status is 0 - Asymptomatic  PHYSICAL EXAM: Most Recent Vital Signs: Blood pressure 147/84, pulse 84, temperature 97.7 F (36.5 C), temperature source Oral, resp. rate 18, height 5\' 8"  (1.727 m), weight 352 lb (159.666 kg), currently breastfeeding. BP 147/84 mmHg  Pulse 84  Temp(Src) 97.7 F (36.5 C) (Oral)  Resp 18  Ht 5\' 8"  (1.727 m)  Wt 352 lb (159.666 kg)  BMI 53.53 kg/m2  General Appearance:    Alert, cooperative, no distress, appears stated age  Head:    Normocephalic, without obvious abnormality, atraumatic  Eyes:    PERRL, conjunctiva/corneas  clear, EOM's intact, fundi    benign, both eyes        Throat:   Lips, mucosa, and tongue normal; teeth and gums normal  Neck:   Supple, symmetrical, trachea midline, no adenopathy;    thyroid:  no enlargement/tenderness/nodules; no carotid   bruit or JVD  Back:     Symmetric, no curvature, ROM normal, no CVA tenderness  Lungs:     Clear to auscultation bilaterally, respirations unlabored  Chest Wall:    No tenderness or deformity   Heart:    Regular rate and rhythm, S1 and S2  normal, no murmur, rub   or gallop     Abdomen:     Soft, non-tender, bowel sounds active all four quadrants,    no masses, no organomegaly        Extremities:   Extremities normal, atraumatic, no cyanosis or edema  Pulses:   2+ and symmetric all extremities  Skin:   Skin color, texture, turgor normal, no rashes or lesions  Lymph nodes:   Cervical, supraclavicular, and axillary nodes normal  Neurologic:   CNII-XII intact, normal strength, sensation and reflexes    throughout   LABORATORY DATA:  No results found for this or any previous visit (from the past 48 hour(s)).    RADIOGRAPHY: No results found.     PATHOLOGY: None  ASSESSMENT/PLAN: Yvonne Ali is a very pleasant 34 yo African American with a history of PE in 2003. At the time, she was a smoker and also on birth control. She was treated initially with heparin and then transitioned to Coumadin. She was on Coumadin for 1 year with no bleeding or complications.  During her last pregnancy she was on 80 mg Lovenox daily. She is now [redacted] weeks pregnant.  We will start her on 80 mg Lovenox daily today.  We will follow-up with her in 1 month.  All questions were answered. She knows to call the clinic with any problems, questions or concerns. We can certainly see her much sooner if necessary. The patient was discussed with Dr. Myna Hidalgo and he is in agreement with the aforementioned.   [facility] M

## 2014-07-14 LAB — HYPERCOAGULABLE PANEL, COMPREHENSIVE
ANTICARDIOLIPIN IGA: 9 U/mL (ref <22)
ANTICARDIOLIPIN IGM: 10 [MPL'U]/mL (ref <11)
AntiThromb III Func: 121 % (ref 76–126)
Anticardiolipin IgG: 7 GPL U/mL (ref <23)
BETA 2 GLYCO I IGG: 11 G Units (ref <20)
BETA-2-GLYCOPROTEIN I IGM: 10 M Units (ref <20)
Beta-2-Glycoprotein I IgA: 14 A Units (ref <20)
DRVVT: 36.9 secs (ref <42.9)
LUPUS ANTICOAGULANT: NOT DETECTED
PROTEIN C ACTIVITY: 100 % (ref 75–133)
PROTEIN C, TOTAL: 89 % (ref 72–160)
PTT Lupus Anticoagulant: 32.5 secs (ref 28.0–43.0)
Protein S Activity: 54 % — ABNORMAL LOW (ref 69–129)
Protein S Total: 86 % (ref 60–150)

## 2014-07-16 ENCOUNTER — Ambulatory Visit: Payer: 59 | Admitting: Hematology

## 2014-07-17 LAB — OB RESULTS CONSOLE RUBELLA ANTIBODY, IGM: RUBELLA: IMMUNE

## 2014-07-17 LAB — OB RESULTS CONSOLE HEPATITIS B SURFACE ANTIGEN: HEP B S AG: NEGATIVE

## 2014-07-17 LAB — OB RESULTS CONSOLE RPR: RPR: NONREACTIVE

## 2014-07-17 LAB — OB RESULTS CONSOLE HIV ANTIBODY (ROUTINE TESTING): HIV: NONREACTIVE

## 2014-08-03 ENCOUNTER — Encounter: Payer: Self-pay | Admitting: Hematology & Oncology

## 2014-08-03 ENCOUNTER — Ambulatory Visit (HOSPITAL_BASED_OUTPATIENT_CLINIC_OR_DEPARTMENT_OTHER): Payer: 59 | Admitting: Hematology & Oncology

## 2014-08-03 ENCOUNTER — Other Ambulatory Visit (HOSPITAL_BASED_OUTPATIENT_CLINIC_OR_DEPARTMENT_OTHER): Payer: 59 | Admitting: Lab

## 2014-08-03 ENCOUNTER — Ambulatory Visit: Payer: 59 | Admitting: Hematology and Oncology

## 2014-08-03 VITALS — BP 128/84 | HR 96 | Temp 98.0°F | Resp 16 | Ht 69.0 in | Wt 352.0 lb

## 2014-08-03 DIAGNOSIS — Z3A11 11 weeks gestation of pregnancy: Secondary | ICD-10-CM

## 2014-08-03 DIAGNOSIS — O9989 Other specified diseases and conditions complicating pregnancy, childbirth and the puerperium: Secondary | ICD-10-CM

## 2014-08-03 DIAGNOSIS — Z86711 Personal history of pulmonary embolism: Secondary | ICD-10-CM

## 2014-08-03 DIAGNOSIS — O133 Gestational [pregnancy-induced] hypertension without significant proteinuria, third trimester: Secondary | ICD-10-CM

## 2014-08-03 LAB — CBC WITH DIFFERENTIAL (CANCER CENTER ONLY)
BASO#: 0 10*3/uL (ref 0.0–0.2)
BASO%: 0.3 % (ref 0.0–2.0)
EOS ABS: 0.1 10*3/uL (ref 0.0–0.5)
EOS%: 1.5 % (ref 0.0–7.0)
HEMATOCRIT: 36 % (ref 34.8–46.6)
HGB: 11.5 g/dL — ABNORMAL LOW (ref 11.6–15.9)
LYMPH#: 2.2 10*3/uL (ref 0.9–3.3)
LYMPH%: 36.4 % (ref 14.0–48.0)
MCH: 26.4 pg (ref 26.0–34.0)
MCHC: 31.9 g/dL — AB (ref 32.0–36.0)
MCV: 83 fL (ref 81–101)
MONO#: 0.6 10*3/uL (ref 0.1–0.9)
MONO%: 9.9 % (ref 0.0–13.0)
NEUT#: 3.1 10*3/uL (ref 1.5–6.5)
NEUT%: 51.9 % (ref 39.6–80.0)
Platelets: 304 10*3/uL (ref 145–400)
RBC: 4.35 10*6/uL (ref 3.70–5.32)
RDW: 15.3 % (ref 11.1–15.7)
WBC: 5.9 10*3/uL (ref 3.9–10.0)

## 2014-08-03 NOTE — Progress Notes (Signed)
  Hematology and Oncology Follow Up Visit  Yvonne SmallingJanine Ali [id number] 09/15/1980 34 y.o. 08/03/2014   Principle Diagnosis:   First trimester pregnancy with past history of pulmonary embolism  Current Therapy:    Lovenox 80 mg subcutaneous daily     Interim History:  [name] is back for follow-up. She is [redacted] weeks pregnant. She's doing quite well. She'll Lovenox and having no problems with Lovenox. She gives her injections into her thighs.  She sees her obstetrician next week.  She's had no problems with bleeding. She's had no problems with nausea vomiting. She has occasional morning sickness.  There is no issues with leg swelling. She's had no rashes.  There is no shortness of breath. She has had no cough. She has had no palpitations.  There's been no headache. There's been no visual changes.  Medications:  Current outpatient prescriptions:  .  enoxaparin (LOVENOX) 80 MG/0.8ML injection, Inject 0.8 mLs (80 mg total) into the skin daily., Disp: 30 Syringe, Rfl: 8 .  Prenatal Vit-Fe Fumarate-FA (PRENATAL MULTIVITAMIN) TABS tablet, Take 1 tablet by mouth daily at 12 noon., Disp: , Rfl:   Allergies: No Known Allergies  Past Medical History, Surgical history, Social history, and Family History were reviewed and updated.  Review of Systems: As above  Physical Exam:  height is 5\' 9"  (1.753 m) and weight is 352 lb (159.666 kg). Her oral temperature is 98 F (36.7 C). Her blood pressure is 128/84 and her pulse is 96. Her respiration is 16.   Well-developed and well-nourished African-American female. She is obese. Head and neck exam shows no ocular or oral lesions. There are no palpable cervical or supraclavicular lymph nodes. Lungs are clear. Cardiac exam regular rate and rhythm with no murmurs, rubs or bruits. Abdomen is soft and obese. She has no fluid wave. There is no guarding or rebound tenderness. There is no palpable liver or spleen tip. Back exam shows no tenderness over the  spine, ribs or hips. Extremities shows no clubbing, cyanosis or edema.  Lab Results  Component Value Date   WBC 5.9 08/03/2014   HGB 11.5* 08/03/2014   HCT 36.0 08/03/2014   MCV 83 08/03/2014   PLT 304 08/03/2014     Chemistry      Component Value Date/Time   NA 139 07/22/2013 1634   K 3.9 07/22/2013 1634   CL 103 07/22/2013 1634   CO2 24 07/22/2013 1634   BUN 10 07/22/2013 1634   CREATININE 1.07 07/22/2013 1634   CREATININE 0.89 07/02/2013 0635   CREATININE 0.82 09/03/2012 0956      Component Value Date/Time   CALCIUM 8.6 07/22/2013 1634   ALKPHOS 73 07/22/2013 1634   AST 31 07/22/2013 1634   ALT 55* 07/22/2013 1634   BILITOT 0.3 07/22/2013 1634         Impression and Plan: [name] is 34 year old African-American female with her second pregnancy. Her first pregnancy went well. Shows have an intact history of an ectopic pregnancy. This was back in 2011.  For now, she will continue on the Lovenox.  She is due in early September.  I will plan to see her back in about 3 months.  I am sure that was closer to her due date, then we may have to switch her over to heparin. Josph MachoENNEVER,Francisco Eyerly R, MD 2/22/20164:11 PM

## 2014-09-11 LAB — OB RESULTS CONSOLE GC/CHLAMYDIA
CHLAMYDIA, DNA PROBE: NEGATIVE
GC PROBE AMP, GENITAL: NEGATIVE

## 2014-11-02 ENCOUNTER — Other Ambulatory Visit: Payer: 59

## 2014-11-02 ENCOUNTER — Ambulatory Visit: Payer: 59 | Admitting: Hematology & Oncology

## 2015-01-26 LAB — OB RESULTS CONSOLE GBS: GBS: POSITIVE

## 2015-02-03 ENCOUNTER — Encounter (HOSPITAL_COMMUNITY): Payer: Self-pay | Admitting: *Deleted

## 2015-02-03 ENCOUNTER — Inpatient Hospital Stay (HOSPITAL_COMMUNITY)
Admission: AD | Admit: 2015-02-03 | Discharge: 2015-02-03 | Disposition: A | Discharge status: Home / Self Care | Payer: 59 | Source: Ambulatory Visit | Attending: Obstetrics & Gynecology | Admitting: Obstetrics & Gynecology

## 2015-02-03 DIAGNOSIS — O403XX0 Polyhydramnios, third trimester, not applicable or unspecified: Secondary | ICD-10-CM | POA: Diagnosis not present

## 2015-02-03 DIAGNOSIS — O139 Gestational [pregnancy-induced] hypertension without significant proteinuria, unspecified trimester: Secondary | ICD-10-CM

## 2015-02-03 DIAGNOSIS — O9989 Other specified diseases and conditions complicating pregnancy, childbirth and the puerperium: Secondary | ICD-10-CM | POA: Diagnosis not present

## 2015-02-03 HISTORY — DX: Gestational (pregnancy-induced) hypertension without significant proteinuria, unspecified trimester: O13.9

## 2015-02-03 LAB — CBC
HCT: 33.3 % — ABNORMAL LOW (ref 36.0–46.0)
HEMOGLOBIN: 10.6 g/dL — AB (ref 12.0–15.0)
MCH: 26.3 pg (ref 26.0–34.0)
MCHC: 31.8 g/dL (ref 30.0–36.0)
MCV: 82.6 fL (ref 78.0–100.0)
Platelets: 267 10*3/uL (ref 150–400)
RBC: 4.03 MIL/uL (ref 3.87–5.11)
RDW: 16 % — ABNORMAL HIGH (ref 11.5–15.5)
WBC: 8.7 10*3/uL (ref 4.0–10.5)

## 2015-02-03 LAB — COMPREHENSIVE METABOLIC PANEL
ALT: 17 U/L (ref 14–54)
ANION GAP: 9 (ref 5–15)
AST: 18 U/L (ref 15–41)
Albumin: 2.7 g/dL — ABNORMAL LOW (ref 3.5–5.0)
Alkaline Phosphatase: 101 U/L (ref 38–126)
BUN: 9 mg/dL (ref 6–20)
CALCIUM: 9 mg/dL (ref 8.9–10.3)
CO2: 23 mmol/L (ref 22–32)
CREATININE: 0.91 mg/dL (ref 0.44–1.00)
Chloride: 104 mmol/L (ref 101–111)
Glucose, Bld: 75 mg/dL (ref 65–99)
Potassium: 3.9 mmol/L (ref 3.5–5.1)
Sodium: 136 mmol/L (ref 135–145)
TOTAL PROTEIN: 6.4 g/dL — AB (ref 6.5–8.1)
Total Bilirubin: 0.6 mg/dL (ref 0.3–1.2)

## 2015-02-03 LAB — PROTEIN / CREATININE RATIO, URINE
CREATININE, URINE: 142 mg/dL
PROTEIN CREATININE RATIO: 0.1 mg/mg{creat} (ref 0.00–0.15)
Total Protein, Urine: 14 mg/dL

## 2015-02-03 LAB — URIC ACID: Uric Acid, Serum: 5.9 mg/dL (ref 2.3–6.6)

## 2015-02-03 NOTE — MAU Provider Note (Signed)
History     CSN: [id number]  Arrival date and time: 02/03/15 1908 - pt not in EPIC at time of arrival d/t downtime Provider notified: 1715 Provider on unit: 1740 Provider at bedside: 1920     Chief Complaint  Patient presents with  . Hypertension   HPI  [name] is a 34 yo G3P1011 female at 37.[redacted] wks gestation sent from the office for elevated blood pressures.  She is here for Yvonne Ali evaluation.  She has a past h/o PEC with her last pregnancy.  Her prenatal care is significant for: CHTN, polyhydramnios, h/o ectopic pregnancy, h/o PE on Lovenox, morbid obesity, and h/o gastric sleeve. Her primary OB provider is Dr. Cherly Ali.  Past Medical History  Diagnosis Date  . Ectopic pregnancy   . Pregnancy induced hypertension   . PE (pulmonary embolism)   . Postpartum care following vaginal delivery (2/2) 07/15/2013  . Gestational hypertension, antepartum 02/03/2015    Past Surgical History  Procedure Laterality Date  . Hip surgery    . Bariatric surgery      gastric sleeve    Family History  Problem Relation Age of Onset  . Hypertension Maternal Grandfather   . Hyperlipidemia Maternal Grandfather   . Cancer Paternal Grandmother 39    ovarian cancer  . Diabetes Paternal Grandmother     Social History  Substance Use Topics  . Smoking status: Former Smoker    Quit date: 05/12/2006  . Smokeless tobacco: Never Used     Comment: quit smoking 8 years ago  . Alcohol Use: 0.0 oz/week    0 Standard drinks or equivalent per week     Comment: stopped with positive UPT    Allergies: No Known Allergies  Prescriptions prior to admission  Medication Sig Dispense Refill Last Dose  . enoxaparin (LOVENOX) 80 MG/0.8ML injection Inject 0.8 mLs (80 mg total) into the skin daily. 30 Syringe 8 Taking  . Prenatal Vit-Fe Fumarate-FA (PRENATAL MULTIVITAMIN) TABS tablet Take 1 tablet by mouth daily at 12 noon.   Taking    Review of Systems  Constitutional: Negative.   HENT: Negative.    Eyes: Negative.   Respiratory: Negative.   Cardiovascular: Negative.   Gastrointestinal: Negative.   Genitourinary: Negative.   Musculoskeletal: Negative.   Skin: Negative.   Neurological: Negative.   Endo/Heme/Allergies: Negative.   Psychiatric/Behavioral: Negative.    CEFM FHR: 145 bpm / moderate variability / accels present / no decels TOCO: none  Results for orders placed or performed during the Ali encounter of 02/03/15 (from the past 24 hour(s))  CBC     Status: Abnormal   Collection Time: 02/03/15  6:14 PM  Result Value Ref Range   WBC 8.7 4.0 - 10.5 K/uL   RBC 4.03 3.87 - 5.11 MIL/uL   Hemoglobin 10.6 (L) 12.0 - 15.0 g/dL   HCT 40.9 (L) 81.1 - 91.4 %   MCV 82.6 78.0 - 100.0 fL   MCH 26.3 26.0 - 34.0 pg   MCHC 31.8 30.0 - 36.0 g/dL   RDW 78.2 (H) 95.6 - 21.3 %   Platelets 267 150 - 400 K/uL  Comprehensive metabolic panel     Status: Abnormal   Collection Time: 02/03/15  6:14 PM  Result Value Ref Range   Sodium 136 135 - 145 mmol/L   Potassium 3.9 3.5 - 5.1 mmol/L   Chloride 104 101 - 111 mmol/L   CO2 23 22 - 32 mmol/L   Glucose, Bld 75 65 - 99 mg/dL  BUN 9 6 - 20 mg/dL   Creatinine, Ser 1.61 0.44 - 1.00 mg/dL   Calcium 9.0 8.9 - 09.6 mg/dL   Total Protein 6.4 (L) 6.5 - 8.1 g/dL   Albumin 2.7 (L) 3.5 - 5.0 g/dL   AST 18 15 - 41 U/L   ALT 17 14 - 54 U/L   Alkaline Phosphatase 101 38 - 126 U/L   Total Bilirubin 0.6 0.3 - 1.2 mg/dL   GFR calc non Af Amer >60 >60 mL/min   GFR calc Af Amer >60 >60 mL/min   Anion gap 9 5 - 15  Protein / creatinine ratio, urine     Status: None   Collection Time: 02/03/15  6:14 PM  Result Value Ref Range   Creatinine, Urine 142.00 mg/dL   Total Protein, Urine 14 mg/dL   Protein Creatinine Ratio 0.10 0.00 - 0.15 mg/mg[Cre]  Uric acid     Status: None   Collection Time: 02/03/15  6:14 PM  Result Value Ref Range   Uric Acid, Serum 5.9 2.3 - 6.6 mg/dL   Physical Exam   Blood pressure 139/82, pulse 115, last menstrual  period 05/17/2014, currently breastfeeding.  Physical Exam  Constitutional: She is oriented to person, place, and time. She appears well-developed and well-nourished.  HENT:  Head: Normocephalic and atraumatic.  Eyes: Pupils are equal, round, and reactive to light.  Neck: Normal range of motion.  Cardiovascular: Normal rate, regular rhythm, normal heart sounds and intact distal pulses.   Respiratory: Effort normal and breath sounds normal.  GI: Soft. Bowel sounds are normal.  Genitourinary:  Pelvic exam deferred; OB visit today in the office  Musculoskeletal: Normal range of motion.  Neurological: She is alert and oriented to person, place, and time. She has normal reflexes.  Skin: Skin is warm and dry.  Psychiatric: She has a normal mood and affect. Her behavior is normal. Judgment and thought content normal.    MAU Course  Procedures CEFM Serial BPs PIH labs Urine protein/creatinine ratio Assessment and Plan  G3P1011 at 37.[redacted] wks gestation Morbid Obesity CHTN w/o PEC  Discharge Home with PEC and Labor precautions F/U with Dr. Cherly Ali in 1 wk Call the office for further problems, questions or concerns  *Dr. Cherly Ali notified of assessment, lab results and plan - agrees  Kenard Gower, MSN, CNM 02/03/2015, 7:30 PM

## 2015-02-03 NOTE — MAU Note (Signed)
Pt presents to MAU from physicians office increase in blood pressures. Denies any headache or blurred vision

## 2015-02-03 NOTE — Discharge Instructions (Signed)
°Hypertension During Pregnancy °Hypertension is also called high blood pressure. Blood pressure moves blood in your body. Sometimes, the force that moves the blood becomes too strong. When you are pregnant, this condition should be watched carefully. It can cause problems for you and your baby. °HOME CARE  °· Make and keep all of your doctor visits. °· Take medicine as told by your doctor. Tell your doctor about all medicines you take. °· Eat very little salt. °· Exercise regularly. °· Do not drink alcohol. °· Do not smoke. °· Do not have drinks with caffeine. °· Lie on your left side when resting. °· Your health care provider may ask you to take one low-dose aspirin (81mg) each day. °GET HELP RIGHT AWAY IF: °· You have bad belly (abdominal) pain. °· You have sudden puffiness (swelling) in the hands, ankles, or face. °· You gain 4 pounds (1.8 kilograms) or more in 1 week. °· You throw up (vomit) repeatedly. °· You have bleeding from the vagina. °· You do not feel the baby moving as much. °· You have a headache. °· You have blurred or double vision. °· You have muscle twitching or spasms. °· You have shortness of breath. °· You have blue fingernails and lips. °· You have blood in your pee (urine). °MAKE SURE YOU: °· Understand these instructions. °· Will watch your condition. °· Will get help right away if you are not doing well or get worse. °Document Released: 07/01/2010 Document Revised: 10/13/2013 Document Reviewed: 12/26/2012 °ExitCare® Patient Information ©2015 [employer]. This information is not intended to replace advice given to you by your health care provider. Make sure you discuss any questions you have with your health care provider. ° ° °Preeclampsia and Eclampsia °Preeclampsia is a serious condition that develops only during pregnancy. It is also called toxemia of pregnancy. This condition causes high blood pressure along with other symptoms, such as swelling and headaches. These may develop as the  condition gets worse. Preeclampsia may occur 20 weeks or later into your pregnancy.  °Diagnosing and treating preeclampsia early is very important. If not treated early, it can cause serious problems for you and your baby. One problem it can lead to is eclampsia, which is a condition that causes muscle jerking or shaking (convulsions) in the mother. Delivering your baby is the best treatment for preeclampsia or eclampsia.  °RISK FACTORS °The cause of preeclampsia is not known. You may be more likely to develop preeclampsia if you have certain risk factors. These include:  °· Being pregnant for the first time. °· Having preeclampsia in a past pregnancy. °· Having a family history of preeclampsia. °· Having high blood pressure. °· Being pregnant with twins or triplets. °· Being 35 or older. °· Being African American. °· Having kidney disease or diabetes. °· Having medical conditions such as lupus or blood diseases. °· Being very overweight (obese). °SIGNS AND SYMPTOMS  °The earliest signs of preeclampsia are: °· High blood pressure. °· Increased protein in your urine. Your health care provider will check for this at every prenatal visit. °Other symptoms that can develop include:  °· Severe headaches. °· Sudden weight gain. °· Swelling of your hands, face, legs, and feet. °· Feeling sick to your stomach (nauseous) and throwing up (vomiting). °· Vision problems (blurred or double vision). °· Numbness in your face, arms, legs, and feet. °· Dizziness. °· Slurred speech. °· Sensitivity to bright lights. °· Abdominal pain. °DIAGNOSIS  °There are no screening tests for preeclampsia. Your health   care provider will ask you about symptoms and check for signs of preeclampsia during your prenatal visits. You may also have tests, including: °· Urine testing. °· Blood testing. °· Checking your baby's heart rate. °· Checking the health of your baby and your placenta using images created with sound waves (ultrasound). °TREATMENT    °You can work out the best treatment approach together with your health care provider. It is very important to keep all prenatal appointments. If you have an increased risk of preeclampsia, you may need more frequent prenatal exams. °· Your health care provider may prescribe bed rest. °· You may have to eat as little salt as possible. °· You may need to take medicine to lower your blood pressure if the condition does not respond to more conservative measures. °· You may need to stay in the hospital if your condition is severe. There, treatment will focus on controlling your blood pressure and fluid retention. You may also need to take medicine to prevent seizures. °· If the condition gets worse, your baby may need to be delivered early to protect you and the baby. You may have your labor started with medicine (be induced), or you may have a cesarean delivery. °· Preeclampsia usually goes away after the baby is born. °HOME CARE INSTRUCTIONS  °· Only take over-the-counter or prescription medicines as directed by your health care provider. °· Lie on your left side while resting. This keeps pressure off your baby. °· Elevate your feet while resting. °· Get regular exercise. Ask your health care provider what type of exercise is safe for you. °· Avoid caffeine and alcohol. °· Do not smoke. °· Drink 6-8 glasses of water every day. °· Eat a balanced diet that is low in salt. Do not add salt to your food. °· Avoid stressful situations as much as possible. °· Get plenty of rest and sleep. °· Keep all prenatal appointments and tests as scheduled. °SEEK MEDICAL CARE IF: °· You are gaining more weight than expected. °· You have any headaches, abdominal pain, or nausea. °· You are bruising more than usual. °· You feel dizzy or light-headed. °SEEK IMMEDIATE MEDICAL CARE IF:  °· You develop sudden or severe swelling anywhere in your body. This usually happens in the legs. °· You gain 5 lb (2.3 kg) or more in a week. °· You have a  severe headache, dizziness, problems with your vision, or confusion. °· You have severe abdominal pain. °· You have lasting nausea or vomiting. °· You have a seizure. °· You have trouble moving any part of your body. °· You develop numbness in your body. °· You have trouble speaking. °· You have any abnormal bleeding. °· You develop a stiff neck. °· You pass out. °MAKE SURE YOU:  °· Understand these instructions. °· Will watch your condition. °· Will get help right away if you are not doing well or get worse. °Document Released: 05/26/2000 Document Revised: 06/03/2013 Document Reviewed: 03/21/2013 °ExitCare® Patient Information ©2015 [employer]. This information is not intended to replace advice given to you by your health care provider. Make sure you discuss any questions you have with your health care provider. ° °

## 2015-02-04 ENCOUNTER — Encounter (HOSPITAL_COMMUNITY): Payer: Self-pay | Admitting: *Deleted

## 2015-02-04 ENCOUNTER — Inpatient Hospital Stay (HOSPITAL_COMMUNITY): Payer: 59 | Admitting: Anesthesiology

## 2015-02-04 ENCOUNTER — Inpatient Hospital Stay (HOSPITAL_COMMUNITY)
Admission: AD | Admit: 2015-02-04 | Discharge: 2015-02-06 | DRG: 774 | Disposition: A | Discharge status: Home / Self Care | Payer: 59 | Source: Ambulatory Visit | Attending: Obstetrics and Gynecology | Admitting: Obstetrics and Gynecology

## 2015-02-04 DIAGNOSIS — O99214 Obesity complicating childbirth: Secondary | ICD-10-CM | POA: Diagnosis present

## 2015-02-04 DIAGNOSIS — D62 Acute posthemorrhagic anemia: Secondary | ICD-10-CM | POA: Diagnosis not present

## 2015-02-04 DIAGNOSIS — Z86711 Personal history of pulmonary embolism: Secondary | ICD-10-CM | POA: Diagnosis not present

## 2015-02-04 DIAGNOSIS — Z833 Family history of diabetes mellitus: Secondary | ICD-10-CM

## 2015-02-04 DIAGNOSIS — O403XX0 Polyhydramnios, third trimester, not applicable or unspecified: Secondary | ICD-10-CM | POA: Diagnosis present

## 2015-02-04 DIAGNOSIS — IMO0001 Reserved for inherently not codable concepts without codable children: Secondary | ICD-10-CM

## 2015-02-04 DIAGNOSIS — O9081 Anemia of the puerperium: Secondary | ICD-10-CM | POA: Diagnosis not present

## 2015-02-04 DIAGNOSIS — K219 Gastro-esophageal reflux disease without esophagitis: Secondary | ICD-10-CM | POA: Diagnosis present

## 2015-02-04 DIAGNOSIS — O9962 Diseases of the digestive system complicating childbirth: Secondary | ICD-10-CM | POA: Diagnosis present

## 2015-02-04 DIAGNOSIS — O139 Gestational [pregnancy-induced] hypertension without significant proteinuria, unspecified trimester: Secondary | ICD-10-CM

## 2015-02-04 DIAGNOSIS — Z87891 Personal history of nicotine dependence: Secondary | ICD-10-CM | POA: Diagnosis not present

## 2015-02-04 DIAGNOSIS — O99824 Streptococcus B carrier state complicating childbirth: Secondary | ICD-10-CM | POA: Diagnosis present

## 2015-02-04 DIAGNOSIS — Z6841 Body Mass Index (BMI) 40.0 and over, adult: Secondary | ICD-10-CM

## 2015-02-04 DIAGNOSIS — O9989 Other specified diseases and conditions complicating pregnancy, childbirth and the puerperium: Secondary | ICD-10-CM | POA: Diagnosis present

## 2015-02-04 DIAGNOSIS — O99019 Anemia complicating pregnancy, unspecified trimester: Secondary | ICD-10-CM | POA: Diagnosis present

## 2015-02-04 DIAGNOSIS — Z8249 Family history of ischemic heart disease and other diseases of the circulatory system: Secondary | ICD-10-CM | POA: Diagnosis not present

## 2015-02-04 DIAGNOSIS — D509 Iron deficiency anemia, unspecified: Secondary | ICD-10-CM | POA: Diagnosis present

## 2015-02-04 DIAGNOSIS — Z3A37 37 weeks gestation of pregnancy: Secondary | ICD-10-CM | POA: Diagnosis present

## 2015-02-04 DIAGNOSIS — O1092 Unspecified pre-existing hypertension complicating childbirth: Secondary | ICD-10-CM | POA: Diagnosis present

## 2015-02-04 HISTORY — DX: Anemia complicating pregnancy, unspecified trimester: O99.019

## 2015-02-04 HISTORY — DX: Iron deficiency anemia, unspecified: D50.9

## 2015-02-04 HISTORY — DX: Acute posthemorrhagic anemia: D62

## 2015-02-04 LAB — TYPE AND SCREEN
ABO/RH(D): B POS
ANTIBODY SCREEN: NEGATIVE

## 2015-02-04 LAB — CBC
HCT: 35.7 % — ABNORMAL LOW (ref 36.0–46.0)
HEMOGLOBIN: 11.5 g/dL — AB (ref 12.0–15.0)
MCH: 26.2 pg (ref 26.0–34.0)
MCHC: 32.2 g/dL (ref 30.0–36.0)
MCV: 81.3 fL (ref 78.0–100.0)
Platelets: 265 10*3/uL (ref 150–400)
RBC: 4.39 MIL/uL (ref 3.87–5.11)
RDW: 15.8 % — ABNORMAL HIGH (ref 11.5–15.5)
WBC: 8.3 10*3/uL (ref 4.0–10.5)

## 2015-02-04 MED ORDER — ONDANSETRON HCL 4 MG/2ML IJ SOLN: 4.0000 mg | Freq: Four times a day (QID) | INTRAMUSCULAR | Status: DC | PRN

## 2015-02-04 MED ORDER — OXYCODONE-ACETAMINOPHEN 5-325 MG PO TABS: 2.0000 | ORAL_TABLET | ORAL | Status: DC | PRN

## 2015-02-04 MED ORDER — ONDANSETRON HCL 4 MG PO TABS: 4.0000 mg | ORAL_TABLET | ORAL | Status: DC | PRN

## 2015-02-04 MED ORDER — BENZOCAINE-MENTHOL 20-0.5 % EX AERO: 1.0000 "application " | INHALATION_SPRAY | CUTANEOUS | Status: DC | PRN

## 2015-02-04 MED ORDER — ACETAMINOPHEN 325 MG PO TABS: 650.0000 mg | ORAL_TABLET | ORAL | Status: DC | PRN

## 2015-02-04 MED ORDER — LACTATED RINGERS IV SOLN: 500.0000 mL | INTRAVENOUS | Status: DC | PRN

## 2015-02-04 MED ORDER — LIDOCAINE HCL (PF) 1 % IJ SOLN
30.0000 mL | INTRAMUSCULAR | Status: DC | PRN
  Filled 2015-02-04 (×2): qty 30

## 2015-02-04 MED ORDER — PENICILLIN G POTASSIUM 5000000 UNITS IJ SOLR
5.0000 10*6.[IU] | Freq: Once | INTRAMUSCULAR | Status: AC
  Administered 2015-02-04: 5 10*6.[IU] via INTRAVENOUS
  Filled 2015-02-04: qty 5

## 2015-02-04 MED ORDER — EPHEDRINE 5 MG/ML INJ: 10.0000 mg | INTRAVENOUS | Status: DC | PRN

## 2015-02-04 MED ORDER — SODIUM CHLORIDE 0.9 % IV SOLN
2.0000 g | INTRAVENOUS | Status: AC
  Administered 2015-02-04: 2 g via INTRAVENOUS
  Filled 2015-02-04: qty 2000

## 2015-02-04 MED ORDER — ZOLPIDEM TARTRATE 5 MG PO TABS: 5.0000 mg | ORAL_TABLET | Freq: Every evening | ORAL | Status: DC | PRN

## 2015-02-04 MED ORDER — ENOXAPARIN SODIUM 80 MG/0.8ML ~~LOC~~ SOLN
80.0000 mg | SUBCUTANEOUS | Status: DC
  Administered 2015-02-05 – 2015-02-06 (×2): 80 mg via SUBCUTANEOUS
  Filled 2015-02-04 (×3): qty 0.8

## 2015-02-04 MED ORDER — DIPHENHYDRAMINE HCL 25 MG PO CAPS: 25.0000 mg | ORAL_CAPSULE | Freq: Four times a day (QID) | ORAL | Status: DC | PRN

## 2015-02-04 MED ORDER — SENNOSIDES-DOCUSATE SODIUM 8.6-50 MG PO TABS
2.0000 | ORAL_TABLET | ORAL | Status: DC
  Administered 2015-02-04 – 2015-02-06 (×2): 2 via ORAL
  Filled 2015-02-04 (×2): qty 2

## 2015-02-04 MED ORDER — TERBUTALINE SULFATE 1 MG/ML IJ SOLN: 0.2500 mg | Freq: Once | INTRAMUSCULAR | Status: DC | PRN

## 2015-02-04 MED ORDER — FENTANYL 2.5 MCG/ML BUPIVACAINE 1/10 % EPIDURAL INFUSION (WH - ANES)
14.0000 mL/h | INTRAMUSCULAR | Status: DC | PRN
  Administered 2015-02-04 (×2): 14 mL/h via EPIDURAL
  Filled 2015-02-04: qty 125

## 2015-02-04 MED ORDER — FERROUS SULFATE 325 (65 FE) MG PO TABS
325.0000 mg | ORAL_TABLET | Freq: Two times a day (BID) | ORAL | Status: DC
  Administered 2015-02-05 – 2015-02-06 (×3): 325 mg via ORAL
  Filled 2015-02-04 (×3): qty 1

## 2015-02-04 MED ORDER — OXYCODONE-ACETAMINOPHEN 5-325 MG PO TABS
2.0000 | ORAL_TABLET | ORAL | Status: DC | PRN
  Administered 2015-02-05 – 2015-02-06 (×2): 2 via ORAL
  Filled 2015-02-04 (×2): qty 2

## 2015-02-04 MED ORDER — CITRIC ACID-SODIUM CITRATE 334-500 MG/5ML PO SOLN: 30.0000 mL | ORAL | Status: DC | PRN

## 2015-02-04 MED ORDER — IBUPROFEN 600 MG PO TABS
600.0000 mg | ORAL_TABLET | Freq: Four times a day (QID) | ORAL | Status: AC
  Administered 2015-02-04 – 2015-02-05 (×2): 600 mg via ORAL
  Filled 2015-02-04 (×2): qty 1

## 2015-02-04 MED ORDER — LANOLIN HYDROUS EX OINT: TOPICAL_OINTMENT | CUTANEOUS | Status: DC | PRN

## 2015-02-04 MED ORDER — DIPHENHYDRAMINE HCL 50 MG/ML IJ SOLN: 12.5000 mg | INTRAMUSCULAR | Status: DC | PRN

## 2015-02-04 MED ORDER — OXYTOCIN 40 UNITS IN LACTATED RINGERS INFUSION - SIMPLE MED
1.0000 m[IU]/min | INTRAVENOUS | Status: DC
  Administered 2015-02-04: 2 m[IU]/min via INTRAVENOUS

## 2015-02-04 MED ORDER — DIBUCAINE 1 % RE OINT: 1.0000 "application " | TOPICAL_OINTMENT | RECTAL | Status: DC | PRN

## 2015-02-04 MED ORDER — PHENYLEPHRINE 40 MCG/ML (10ML) SYRINGE FOR IV PUSH (FOR BLOOD PRESSURE SUPPORT)
80.0000 ug | PREFILLED_SYRINGE | INTRAVENOUS | Status: DC | PRN
  Filled 2015-02-04: qty 20

## 2015-02-04 MED ORDER — OXYCODONE-ACETAMINOPHEN 5-325 MG PO TABS: 1.0000 | ORAL_TABLET | ORAL | Status: DC | PRN

## 2015-02-04 MED ORDER — LIDOCAINE HCL (PF) 1 % IJ SOLN
INTRAMUSCULAR | Status: DC | PRN
  Administered 2015-02-04: 2 mL via EPIDURAL
  Administered 2015-02-04 (×2): 4 mL via EPIDURAL

## 2015-02-04 MED ORDER — ONDANSETRON HCL 4 MG/2ML IJ SOLN: 4.0000 mg | INTRAMUSCULAR | Status: DC | PRN

## 2015-02-04 MED ORDER — SIMETHICONE 80 MG PO CHEW: 80.0000 mg | CHEWABLE_TABLET | ORAL | Status: DC | PRN

## 2015-02-04 MED ORDER — OXYTOCIN BOLUS FROM INFUSION: 500.0000 mL | INTRAVENOUS | Status: DC

## 2015-02-04 MED ORDER — PENICILLIN G POTASSIUM 5000000 UNITS IJ SOLR
2.5000 10*6.[IU] | INTRAVENOUS | Status: DC
  Filled 2015-02-04 (×3): qty 2.5

## 2015-02-04 MED ORDER — PRENATAL MULTIVITAMIN CH
1.0000 | ORAL_TABLET | Freq: Every day | ORAL | Status: DC
  Administered 2015-02-05 – 2015-02-06 (×2): 1 via ORAL
  Filled 2015-02-04 (×2): qty 1

## 2015-02-04 MED ORDER — OXYTOCIN 40 UNITS IN LACTATED RINGERS INFUSION - SIMPLE MED
62.5000 mL/h | INTRAVENOUS | Status: DC
  Filled 2015-02-04 (×2): qty 1000

## 2015-02-04 MED ORDER — FLEET ENEMA 7-19 GM/118ML RE ENEM: 1.0000 | ENEMA | RECTAL | Status: DC | PRN

## 2015-02-04 MED ORDER — OXYCODONE-ACETAMINOPHEN 5-325 MG PO TABS
1.0000 | ORAL_TABLET | ORAL | Status: DC | PRN
  Administered 2015-02-05: 1 via ORAL
  Filled 2015-02-04: qty 1

## 2015-02-04 MED ORDER — WITCH HAZEL-GLYCERIN EX PADS: 1.0000 "application " | MEDICATED_PAD | CUTANEOUS | Status: DC | PRN

## 2015-02-04 MED ORDER — LACTATED RINGERS IV SOLN
INTRAVENOUS | Status: DC
  Administered 2015-02-04: 125 mL/h via INTRAVENOUS
  Administered 2015-02-04: 17:00:00 via INTRAVENOUS

## 2015-02-04 NOTE — H&P (Signed)
Yvonne Ali is a 34 y.o. female presenting for admission due to complete dilation @ 37 4/7 weeks. PNC complicated by recent dx of polyhydramnios, morbid obesity  Maternal Medical History:  Fetal activity: Perceived fetal activity is normal.    Prenatal complications: Polyhydramnios.   Hx gastric sleeve Hx pulmonary embolism Hx htn on no med  Prenatal Complications - Diabetes: none.    OB History    Gravida Para Term Preterm AB TAB SAB Ectopic Multiple Living   Past Medical History  Diagnosis Date  . Ectopic pregnancy   . Pregnancy induced hypertension   . PE (pulmonary embolism)   . Postpartum care following vaginal delivery (2/2) 07/15/2013  . Gestational hypertension, antepartum 02/03/2015   Past Surgical History  Procedure Laterality Date  . Hip surgery    . Bariatric surgery      gastric sleeve   Family History: family history includes Cancer (age of onset: 70) in her paternal grandmother; Diabetes in her paternal grandmother; Hyperlipidemia in her maternal grandfather; Hypertension in her maternal grandfather. Social History:  reports that she quit smoking about 8 years ago. She has never used smokeless tobacco. She reports that she drinks alcohol. She reports that she does not use illicit drugs.   Prenatal Transfer Tool  Maternal Diabetes: No Genetic Screening: Normal Maternal Ultrasounds/Referrals: Abnormal:  Findings:   Other:polyhydramnios Fetal Ultrasounds or other Referrals:  None Maternal Substance Abuse:  No Significant Maternal Medications:  Meds include: Zantac Other: lovenox Significant Maternal Lab Results:  Lab values include: Group B Strep positive Other Comments:  polyhydramnios, hx PE on lovenox, GERD,  ROS neg  Dilation: 10 (Membranes bulging) Effacement (%): 100 Exam by:: Dellie Burns, RN BSN Last menstrual period 05/17/2014, currently breastfeeding.station -3 Exam Physical Exam  Constitutional: She is oriented to  person, place, and time. She appears well-developed and well-nourished.  HENT:  Head: Atraumatic.  Eyes: EOM are normal.  Neck: Neck supple.  Cardiovascular: Normal rate and regular rhythm.   GI: Soft.  Musculoskeletal: She exhibits edema.  Neurological: She is alert and oriented to person, place, and time.  Skin: Skin is warm and dry.  Psychiatric: She has a normal mood and affect.    Prenatal labs: ABO, Rh:  B positive Antibody:  neg Rubella:  Immune RPR:   NR HBsAg:   neg HIV:   neg GBS:   positive  Assessment/Plan: Complete dilation Hx PE on lovenox Polyhydramnios Morbid obesity IUP @ 37 4/7 weeks GBS cx (+) P) admit. Routine labs. Await SVD. Defer amniotomy due to station. Restart lovenox 12 hrs post delivery. IV Amp due to anticipated rapid delivery   Miquel Lamson A 02/04/2015, 1:24 PM

## 2015-02-04 NOTE — MAU Note (Signed)
Patient states contractions since 0900 this morning thinks might be leaking amniotic fluid.

## 2015-02-04 NOTE — Anesthesia Preprocedure Evaluation (Signed)
Anesthesia Evaluation  Patient identified by MRN, date of birth, ID band Patient awake    Reviewed: Allergy & Precautions, H&P , NPO status , Patient's Chart, lab work & pertinent test results  Airway Mallampati: III  TM Distance: >3 FB Neck ROM: full    Dental no notable dental hx.    Pulmonary neg pulmonary ROS, former smoker,  breath sounds clear to auscultation  Pulmonary exam normal       Cardiovascular hypertension, negative cardio ROS Normal cardiovascular examRhythm:regular Rate:Normal     Neuro/Psych negative neurological ROS  negative psych ROS   GI/Hepatic negative GI ROS, Neg liver ROS,   Endo/Other  Morbid obesity  Renal/GU negative Renal ROS  negative genitourinary   Musculoskeletal   Abdominal (+) + obese,   Peds  Hematology negative hematology ROS (+)   Anesthesia Other Findings Pregnancy - hx of PE so bridging with lovenox during pregnancy, last taken on Tuesday which is >48 hours ago Platelets and allergies reviewed Denies active cardiac or pulmonary symptoms, patient is super morbid obese  Denies asthma, supine hypotension syndrome, previous anesthesia difficulties    Reproductive/Obstetrics (+) Pregnancy                             Anesthesia Physical Anesthesia Plan  ASA: III  Anesthesia Plan: Epidural   Post-op Pain Management:    Induction:   Airway Management Planned:   Additional Equipment:   Intra-op Plan:   Post-operative Plan:   Informed Consent: I have reviewed the patients History and Physical, chart, labs and discussed the procedure including the risks, benefits and alternatives for the proposed anesthesia with the patient or authorized representative who has indicated his/her understanding and acceptance.     Plan Discussed with:   Anesthesia Plan Comments:         Anesthesia Quick Evaluation

## 2015-02-04 NOTE — Progress Notes (Signed)
S: epidural per pt request per RN Denies pelvic pressure  O: VE fully/-3 vtx bulging membranes  Tracing: baseline 145 (+) accels good variability Ctx q 2-3 mins  IMP: Complete Hx PE on lovenox last dose Tuesday GBS cx (+) on AMP P) left exaggerated sims position. Start pitocin if spaced ctx. Labor vtx down. If still undelivered, switch to PCN IV for (+) GBS

## 2015-02-04 NOTE — Anesthesia Procedure Notes (Signed)
Epidural Patient location during procedure: OB  Staffing Anesthesiologist: Kenshin Splawn Performed by: anesthesiologist   Preanesthetic Checklist Completed: patient identified, site marked, surgical consent, pre-op evaluation, timeout performed, IV checked, risks and benefits discussed and monitors and equipment checked  Epidural Patient position: sitting Prep: site prepped and draped and DuraPrep Patient monitoring: continuous pulse ox and blood pressure Approach: midline Location: L2-L3 Injection technique: LOR saline  Needle:  Needle type: Tuohy  Needle gauge: 17 G Needle length: 15 cm and 15 Needle insertion depth: 5 cm and 11 cm Catheter type: closed end flexible Catheter size: 19 Gauge Catheter at skin depth: 16 cm Test dose: negative  Assessment Events: blood not aspirated, injection not painful, no injection resistance, negative IV test and no paresthesia  Additional Notes Patient identified. Risks/Benefits/Options discussed with patient including but not limited to bleeding, infection, nerve damage, paralysis, failed block, incomplete pain control, headache, blood pressure changes, nausea, vomiting, reactions to medications, itching and postpartum back pain. Confirmed with bedside nurse the patient's most recent platelet count. Confirmed with patient that they are not currently taking any anticoagulation, have any bleeding history or any family history of bleeding disorders. Patient expressed understanding and wished to proceed. All questions were answered. Sterile technique was used throughout the entire procedure. Please see nursing notes for vital signs. Test dose was given through epidural catheter and negative prior to continuing to dose epidural or start infusion. Warning signs of high block given to the patient including shortness of breath, tingling/numbness in hands, complete motor block, or any concerning symptoms with instructions to call for help. Patient was  given instructions on fall risk and not to get out of bed. All questions and concerns addressed with instructions to call with any issues or inadequate analgesia.

## 2015-02-04 NOTE — Progress Notes (Signed)
Dr. Cherly Hensen notified of elevated blood pressures of 182/87 and 163/105. No new orders given.

## 2015-02-05 LAB — RPR: RPR: NONREACTIVE

## 2015-02-05 LAB — CBC
HEMATOCRIT: 27.9 % — AB (ref 36.0–46.0)
Hemoglobin: 8.9 g/dL — ABNORMAL LOW (ref 12.0–15.0)
MCH: 26.1 pg (ref 26.0–34.0)
MCHC: 31.9 g/dL (ref 30.0–36.0)
MCV: 81.8 fL (ref 78.0–100.0)
Platelets: 241 10*3/uL (ref 150–400)
RBC: 3.41 MIL/uL — AB (ref 3.87–5.11)
RDW: 15.9 % — AB (ref 11.5–15.5)
WBC: 11.9 10*3/uL — AB (ref 4.0–10.5)

## 2015-02-05 LAB — T4, FREE: Free T4: 1.05 ng/dL (ref 0.61–1.12)

## 2015-02-05 LAB — TSH: TSH: 0.646 u[IU]/mL (ref 0.350–4.500)

## 2015-02-05 MED ORDER — IBUPROFEN 600 MG PO TABS
600.0000 mg | ORAL_TABLET | Freq: Four times a day (QID) | ORAL | Status: DC
  Administered 2015-02-05 – 2015-02-06 (×4): 600 mg via ORAL
  Filled 2015-02-05 (×4): qty 1

## 2015-02-05 MED ORDER — MAGNESIUM OXIDE 400 (241.3 MG) MG PO TABS
200.0000 mg | ORAL_TABLET | Freq: Every day | ORAL | Status: DC
  Administered 2015-02-05 – 2015-02-06 (×2): 200 mg via ORAL
  Filled 2015-02-05 (×4): qty 0.5

## 2015-02-05 NOTE — Anesthesia Postprocedure Evaluation (Signed)
Anesthesia Post Note  Patient: Yvonne Ali  Procedure(s) Performed: * No procedures listed *  Anesthesia type: Epidural  Patient location: Mother/Baby  Post pain: Pain level controlled  Post assessment: Post-op Vital signs reviewed  Last Vitals:  Filed Vitals:   02/05/15 0619  BP: 133/68  Pulse: 108  Temp: 36.7 C  Resp: 18    Post vital signs: Reviewed  Level of consciousness: awake  Complications: No apparent anesthesia complications

## 2015-02-05 NOTE — Progress Notes (Addendum)
PPD #1- SVD  Subjective:   Reports feeling well Tolerating po/ No nausea or vomiting Bleeding is light Pain controlled with Percocet and Motrin Up ad lib / ambulatory / voiding without problems Newborn: breastfeeding     Objective:   VS:  VS:  Filed Vitals:   02/04/15 2240 02/04/15 2333 02/05/15 0316 02/05/15 0619  BP: 182/87 163/105 148/84 133/68  Pulse: 124 135 110 108  Temp: 98 F (36.7 C)  97.9 F (36.6 C) 98 F (36.7 C)  TempSrc: Oral  Axillary Oral  Resp: Height:      Weight:      SpO2: 99%       LABS:  Recent Labs  02/04/15 1330 02/05/15 0618  WBC 8.3 11.9*  HGB 11.5* 8.9*  PLT 265 241   Blood type: --/--/B POS (08/25 1323) Rubella: Immune (02/05 0000)   I&O: Intake/Output      08/25 0701 - 08/26 0700 08/26 0701 - 08/27 0700   Urine (mL/kg/hr) 750    Blood 450    Total Output 1200     Net -1200          Urine Occurrence 1 x      Physical Exam: Alert and oriented x3 Abdomen: soft, non-tender, non-distended  Fundus: firm, non-tender, U-2 Perineum: intact, no edema or erythema Lochia: small Extremities: trace BLE edema, no calf pain or tenderness   Assessment:  PPD #1 G3P2012/ S/P:spontaneous vaginal  Hx pulmonary embolus  CHTN-stable Doing well    Plan: D/C IV Continue Lovenox Continue routine post partum orders Anticipate D/C home tomorrow   Donette Larry, N MSN, CNM 02/05/2015, 4:57 PM

## 2015-02-05 NOTE — Lactation Note (Signed)
This note was copied from the chart of Yvonne Titilayo Hagans. Lactation Consultation Note; Experienced BF mom reports baby has had a couple of good feedings but has been sleepy since 10 am. Encouraged to unwrap and undress and try tp latch. Diaper changed and baby woke but would not latch. Mom easily able to hand express Colostrum. Skin to skin with mom. Encouraged to watch for feeding cues and try to feed at least every 3 hours.BF brochure given with resources for support after DC. No questions at present. To call for assist prn  Patient Name: Yvonne Ali Date: 02/05/2015 Reason for consult: Initial assessment   Maternal Data Formula Feeding for Exclusion: No Has patient been taught Hand Expression?: Yes  Feeding Feeding Type: Breast Fed  LATCH Score/Interventions Latch: Too sleepy or reluctant, no latch achieved, no sucking elicited.  Audible Swallowing: None Intervention(s): Skin to skin;Hand expression  Type of Nipple: Everted at rest and after stimulation  Comfort (Breast/Nipple): Soft / non-tender     Hold (Positioning): Assistance needed to correctly position infant at breast and maintain latch. Intervention(s): Breastfeeding basics reviewed;Skin to skin  LATCH Score: 5  Lactation Tools Discussed/Used     Consult Status Consult Status: Follow-up Date: 02/06/15 Follow-up type: In-patient    Pamelia Hoit 02/05/2015, 3:37 PM

## 2015-02-06 ENCOUNTER — Encounter (HOSPITAL_COMMUNITY): Payer: Self-pay | Admitting: Obstetrics and Gynecology

## 2015-02-06 DIAGNOSIS — D62 Acute posthemorrhagic anemia: Secondary | ICD-10-CM

## 2015-02-06 DIAGNOSIS — D509 Iron deficiency anemia, unspecified: Secondary | ICD-10-CM

## 2015-02-06 DIAGNOSIS — O99019 Anemia complicating pregnancy, unspecified trimester: Secondary | ICD-10-CM

## 2015-02-06 HISTORY — DX: Iron deficiency anemia, unspecified: D50.9

## 2015-02-06 HISTORY — DX: Acute posthemorrhagic anemia: D62

## 2015-02-06 LAB — T3: T3 TOTAL: 124 ng/dL (ref 71–180)

## 2015-02-06 MED ORDER — FERROUS SULFATE 325 (65 FE) MG PO TABS: 325.0000 mg | ORAL_TABLET | Freq: Two times a day (BID) | ORAL | Status: DC

## 2015-02-06 MED ORDER — IBUPROFEN 600 MG PO TABS: 600.0000 mg | ORAL_TABLET | Freq: Four times a day (QID) | ORAL | Status: DC

## 2015-02-06 NOTE — Progress Notes (Addendum)
PPD #2 - SVD  Subjective:   Reports feeling well Tolerating po/ No nausea or vomiting Bleeding is light Pain controlled with Percocet and Motrin Up ad lib / ambulatory / voiding without problems Newborn: breastfeeding     Objective:   VS:  VS:  Filed Vitals:   02/05/15 0316 02/05/15 0619 02/05/15 1749 02/06/15 0602  BP: 148/84 133/68 140/79 137/91  Pulse: 110 108 120 108  Temp: 97.9 F (36.6 C) 98 F (36.7 C) 97.4 F (36.3 C) 98.4 F (36.9 C)  TempSrc: Axillary Oral Oral Oral  Resp: Height:      Weight:      SpO2:        LABS:   Recent Labs  02/04/15 1330 02/05/15 0618  WBC 8.3 11.9*  HGB 11.5* 8.9*  PLT 265 241   Blood type: --/--/B POS (08/25 1323) Rubella: Immune (02/05 0000)   I&O: Intake/Output      08/26 0701 - 08/27 0700 08/27 0701 - 08/28 0700   Urine (mL/kg/hr)     Blood     Total Output       Net              Physical Exam: Alert and oriented x3 Abdomen: soft, non-tender, non-distended  Fundus: firm, non-tender, U-2 Perineum: intact, no edema or erythema Lochia: small Extremities: trace BLE edema, no calf pain or tenderness   Assessment:  PPD #2 / [id number] / S/P:spontaneous vaginal  Hx pulmonary embolus  CHTN-stable IDA with compounding ABL Anemia Doing well    Plan: Continue Lovenox Continue routine post partum orders Continue FeSO4 BID D/C home today BP recheck in 1 wk   Raelyn Mora, Judie Petit MSN, CNM 02/06/2015, 8:15 AM

## 2015-02-06 NOTE — Discharge Summary (Signed)
Obstetric Discharge Summary Reason for Admission: complete dilation upon admission, morbid obesity, hx PE on anticoagulation, polyhydramnios Prenatal Procedures: NST and ultrasound Intrapartum Course: Admitted for complete dilation at 37.4 wks / epidural for pain management / labored down vertex x several hours / SVD of viable female over intact perineum by Dr. Cherly Hensen / no immediate postpartum complications noted Intrapartum Procedures: spontaneous vaginal delivery Postpartum Procedures: none Complications-Operative and Postpartum: none HEMOGLOBIN  Date Value Ref Range Status  02/05/2015 8.9* 12.0 - 15.0 g/dL Final    Comment:    DELTA CHECK NOTED REPEATED TO VERIFY    HGB  Date Value Ref Range Status  08/03/2014 11.5* 11.6 - 15.9 g/dL Final   HCT  Date Value Ref Range Status  02/05/2015 27.9* 36.0 - 46.0 % Final  08/03/2014 36.0 34.8 - 46.6 % Final    Physical Exam:  General: alert, cooperative, fatigued, no distress and morbidly obese Lochia: appropriate Uterine Fundus: firm, U-2, midline Perineum: intact, no edema DVT Evaluation: No evidence of DVT seen on physical exam. Negative Homan's sign. No cords or calf tenderness. Calf/Ankle edema is present.  Discharge Diagnoses: Term Pregnancy-delivered / polyhydramnios  Discharge Information: Date: 02/06/2015 Activity: pelvic rest Diet: routine Medications: PNV, Ibuprofen and Iron, lovenox Condition: stable Instructions: refer to practice specific booklet Discharge to: home   Newborn Data: Live born female on 02/04/2013 Birth Weight: 8 lb 12 oz (3969 g) APGAR: 8, 9  Home with mother.  Yvonne Ali, M MSN, CNM 02/06/2015, 11:52 AM

## 2015-02-06 NOTE — Lactation Note (Signed)
This note was copied from the chart of Yvonne Myalynn Lingle. Lactation Consultation Note  Mom reports BF well.  Denies concerns.  Aware of support groups and outpatient sevices.  Patient Name: Yvonne Ali [id number] Date: 02/06/2015     Maternal Data    Feeding    LATCH Score/Interventions                      Lactation Tools Discussed/Used     Consult Status      Soyla Dryer 02/06/2015, 11:26 AM

## 2015-02-06 NOTE — Progress Notes (Signed)
Pt unable to wear TED stocking, pt stated they were very uncomfortable.    M.Bhambi CNM notified and asked if pt could use SCDs instead

## 2015-02-06 NOTE — Discharge Instructions (Signed)
Breastfeeding and Mastitis °Mastitis is inflammation of the breast tissue. It can occur in women who are breastfeeding. This can make breastfeeding painful. Mastitis will sometimes go away on its own. Your health care provider will help determine if treatment is needed. °CAUSES °Mastitis is often associated with a blocked milk (lactiferous) duct. This can happen when too much milk builds up in the breast. Causes of excess milk in the breast can include: °· Poor latch-on. If your baby is not latched onto the breast properly, she or he may not empty your breast completely while breastfeeding. °· Allowing too much time to pass between feedings. °· Wearing a bra or other clothing that is too tight. This puts extra pressure on the lactiferous ducts so milk does not flow through them as it should. °Mastitis can also be caused by a bacterial infection. Bacteria may enter the breast tissue through cuts or openings in the skin. In women who are breastfeeding, this may occur because of cracked or irritated skin. Cracks in the skin are often caused when your baby does not latch on properly to the breast. °SIGNS AND SYMPTOMS °· Swelling, redness, tenderness, and pain in an area of the breast. °· Swelling of the glands under the arm on the same side. °· Fever may or may not accompany mastitis. °If an infection is allowed to progress, a collection of pus (abscess) may develop. °DIAGNOSIS  °Your health care provider can usually diagnose mastitis based on your symptoms and a physical exam. Tests may be done to help confirm the diagnosis. These may include: °· Removal of pus from the breast by applying pressure to the area. This pus can be examined in the lab to determine which bacteria are present. If an abscess has developed, the fluid in the abscess can be removed with a needle. This can also be used to confirm the diagnosis and determine the bacteria present. In most cases, pus will not be present. °· Blood tests to determine if  your body is fighting a bacterial infection. °· Mammogram or ultrasound tests to rule out other problems or diseases. °TREATMENT  °Mastitis that occurs with breastfeeding will sometimes go away on its own. Your health care provider may choose to wait 24 hours after first seeing you to decide whether a prescription medicine is needed. If your symptoms are worse after 24 hours, your health care provider will likely prescribe an antibiotic medicine to treat the mastitis. He or she will determine which bacteria are most likely causing the infection and will then select an appropriate antibiotic medicine. This is sometimes changed based on the results of tests performed to identify the bacteria, or if there is no response to the antibiotic medicine selected. Antibiotic medicines are usually given by mouth. You may also be given medicine for pain. °HOME CARE INSTRUCTIONS °· Only take over-the-counter or prescription medicines for pain, fever, or discomfort as directed by your health care provider. °· If your health care provider prescribed an antibiotic medicine, take the medicine as directed. Make sure you finish it even if you start to feel better. °· Do not wear a tight or underwire bra. Wear a soft, supportive bra. °· Increase your fluid intake, especially if you have a fever. °· Continue to empty the breast. Your health care provider can tell you whether this milk is safe for your infant or needs to be thrown out. You may be told to stop nursing until your health care provider thinks it is safe for your baby.   Use a breast pump if you are advised to stop nursing.  Keep your nipples clean and dry.  Empty the first breast completely before going to the other breast. If your baby is not emptying your breasts completely for some reason, use a breast pump to empty your breasts.  If you go back to work, pump your breasts while at work to stay in time with your nursing schedule.  Avoid allowing your breasts to become  overly filled with milk (engorged). SEEK MEDICAL CARE IF:  You have pus-like discharge from the breast.  Your symptoms do not improve with the treatment prescribed by your health care provider within 2 days. SEEK IMMEDIATE MEDICAL CARE IF:  Your pain and swelling are getting worse.  You have pain that is not controlled with medicine.  You have a red line extending from the breast toward your armpit.  You have a fever or persistent symptoms for more than 2-3 days.  You have a fever and your symptoms suddenly get worse. MAKE SURE YOU:   Understand these instructions.  Will watch your condition.  Will get help right away if you are not doing well or get worse. Document Released: 09/23/2004 Document Revised: 06/03/2013 Document Reviewed: 01/02/2013 [facility] Patient Information 2015 [place], [place]. This information is not intended to replace advice given to you by your health care provider. Make sure you discuss any questions you have with your health care provider. Breastfeeding Deciding to breastfeed is one of the best choices you can make for you and your baby. A change in hormones during pregnancy causes your breast tissue to grow and increases the number and size of your milk ducts. These hormones also allow proteins, sugars, and fats from your blood supply to make breast milk in your milk-producing glands. Hormones prevent breast milk from being released before your baby is born as well as prompt milk flow after birth. Once breastfeeding has begun, thoughts of your baby, as well as his or her sucking or crying, can stimulate the release of milk from your milk-producing glands.  BENEFITS OF BREASTFEEDING For Your Baby  Your first milk (colostrum) helps your baby's digestive system function better.   There are antibodies in your milk that help your baby fight off infections.   Your baby has a lower incidence of asthma, allergies, and sudden infant death syndrome.   The  nutrients in breast milk are better for your baby than infant formulas and are designed uniquely for your baby's needs.   Breast milk improves your baby's brain development.   Your baby is less likely to develop other conditions, such as childhood obesity, asthma, or type 2 diabetes mellitus.  For You   Breastfeeding helps to create a very special bond between you and your baby.   Breastfeeding is convenient. Breast milk is always available at the correct temperature and costs nothing.   Breastfeeding helps to burn calories and helps you lose the weight gained during pregnancy.   Breastfeeding makes your uterus contract to its prepregnancy size faster and slows bleeding (lochia) after you give birth.   Breastfeeding helps to lower your risk of developing type 2 diabetes mellitus, osteoporosis, and breast or ovarian cancer later in life. SIGNS THAT YOUR BABY IS HUNGRY Early Signs of Hunger  Increased alertness or activity.  Stretching.  Movement of the head from side to side.  Movement of the head and opening of the mouth when the corner of the mouth or cheek is stroked (rooting).  Increased  sucking sounds, smacking lips, cooing, sighing, or squeaking.  Hand-to-mouth movements.  Increased sucking of fingers or hands. Late Signs of Hunger  Fussing.  Intermittent crying. Extreme Signs of Hunger Signs of extreme hunger will require calming and consoling before your baby will be able to breastfeed successfully. Do not wait for the following signs of extreme hunger to occur before you initiate breastfeeding:   Restlessness.  A loud, strong cry.   Screaming. BREASTFEEDING BASICS Breastfeeding Initiation  Find a comfortable place to sit or lie down, with your neck and back well supported.  Place a pillow or rolled up blanket under your baby to bring him or her to the level of your breast (if you are seated). Nursing pillows are specially designed to help support  your arms and your baby while you breastfeed.  Make sure that your baby's abdomen is facing your abdomen.   Gently massage your breast. With your fingertips, massage from your chest wall toward your nipple in a circular motion. This encourages milk flow. You may need to continue this action during the feeding if your milk flows slowly.  Support your breast with 4 fingers underneath and your thumb above your nipple. Make sure your fingers are well away from your nipple and your baby's mouth.   Stroke your baby's lips gently with your finger or nipple.   When your baby's mouth is open wide enough, quickly bring your baby to your breast, placing your entire nipple and as much of the colored area around your nipple (areola) as possible into your baby's mouth.   More areola should be visible above your baby's upper lip than below the lower lip.   Your baby's tongue should be between his or her lower gum and your breast.   Ensure that your baby's mouth is correctly positioned around your nipple (latched). Your baby's lips should create a seal on your breast and be turned out (everted).  It is common for your baby to suck about 2-3 minutes in order to start the flow of breast milk. Latching Teaching your baby how to latch on to your breast properly is very important. An improper latch can cause nipple pain and decreased milk supply for you and poor weight gain in your baby. Also, if your baby is not latched onto your nipple properly, he or she may swallow some air during feeding. This can make your baby fussy. Burping your baby when you switch breasts during the feeding can help to get rid of the air. However, teaching your baby to latch on properly is still the best way to prevent fussiness from swallowing air while breastfeeding. Signs that your baby has successfully latched on to your nipple:    Silent tugging or silent sucking, without causing you pain.   Swallowing heard between every  3-4 sucks.    Muscle movement above and in front of his or her ears while sucking.  Signs that your baby has not successfully latched on to nipple:   Sucking sounds or smacking sounds from your baby while breastfeeding.  Nipple pain. If you think your baby has not latched on correctly, slip your finger into the corner of your baby's mouth to break the suction and place it between your baby's gums. Attempt breastfeeding initiation again. Signs of Successful Breastfeeding Signs from your baby:   A gradual decrease in the number of sucks or complete cessation of sucking.   Falling asleep.   Relaxation of his or her body.   Retention  of a small amount of milk in his or her mouth.   Letting go of your breast by himself or herself. Signs from you:  Breasts that have increased in firmness, weight, and size 1-3 hours after feeding.   Breasts that are softer immediately after breastfeeding.  Increased milk volume, as well as a change in milk consistency and color by the fifth day of breastfeeding.   Nipples that are not sore, cracked, or bleeding. Signs That Your Pecola Leisure is Getting Enough Milk  Wetting at least 3 diapers in a 24-hour period. The urine should be clear and pale yellow by age 61 days.  At least 3 stools in a 24-hour period by age 61 days. The stool should be soft and yellow.  At least 3 stools in a 24-hour period by age 65 days. The stool should be seedy and yellow.  No loss of weight greater than 10% of birth weight during the first 14 days of age.  Average weight gain of 4-7 ounces (113-198 g) per week after age 60 days.  Consistent daily weight gain by age 61 days, without weight loss after the age of 2 weeks. After a feeding, your baby may spit up a small amount. This is common. BREASTFEEDING FREQUENCY AND DURATION Frequent feeding will help you make more milk and can prevent sore nipples and breast engorgement. Breastfeed when you feel the need to reduce the  fullness of your breasts or when your baby shows signs of hunger. This is called "breastfeeding on demand." Avoid introducing a pacifier to your baby while you are working to establish breastfeeding (the first 4-6 weeks after your baby is born). After this time you may choose to use a pacifier. Research has shown that pacifier use during the first year of a baby's life decreases the risk of sudden infant death syndrome (SIDS). Allow your baby to feed on each breast as long as he or she wants. Breastfeed until your baby is finished feeding. When your baby unlatches or falls asleep while feeding from the first breast, offer the second breast. Because newborns are often sleepy in the first few weeks of life, you may need to awaken your baby to get him or her to feed. Breastfeeding times will vary from baby to baby. However, the following rules can serve as a guide to help you ensure that your baby is properly fed:  Newborns (babies 80 weeks of age or younger) may breastfeed every 1-3 hours.  Newborns should not go longer than 3 hours during the day or 5 hours during the night without breastfeeding.  You should breastfeed your baby a minimum of 8 times in a 24-hour period until you begin to introduce solid foods to your baby at around 30 months of age. BREAST MILK PUMPING Pumping and storing breast milk allows you to ensure that your baby is exclusively fed your breast milk, even at times when you are unable to breastfeed. This is especially important if you are going back to work while you are still breastfeeding or when you are not able to be present during feedings. Your lactation consultant can give you guidelines on how long it is safe to store breast milk.  A breast pump is a machine that allows you to pump milk from your breast into a sterile bottle. The pumped breast milk can then be stored in a refrigerator or freezer. Some breast pumps are operated by hand, while others use electricity. Ask your  lactation consultant which type will  work best for you. Breast pumps can be purchased, but some hospitals and breastfeeding support groups lease breast pumps on a monthly basis. A lactation consultant can teach you how to hand express breast milk, if you prefer not to use a pump.  CARING FOR YOUR BREASTS WHILE YOU BREASTFEED Nipples can become dry, cracked, and sore while breastfeeding. The following recommendations can help keep your breasts moisturized and healthy:  Avoid using soap on your nipples.   Wear a supportive bra. Although not required, special nursing bras and tank tops are designed to allow access to your breasts for breastfeeding without taking off your entire bra or top. Avoid wearing underwire-style bras or extremely tight bras.  Air dry your nipples for 3-32minutes after each feeding.   Use only cotton bra pads to absorb leaked breast milk. Leaking of breast milk between feedings is normal.   Use lanolin on your nipples after breastfeeding. Lanolin helps to maintain your skin's normal moisture barrier. If you use pure lanolin, you do not need to wash it off before feeding your baby again. Pure lanolin is not toxic to your baby. You may also hand express a few drops of breast milk and gently massage that milk into your nipples and allow the milk to air dry. In the first few weeks after giving birth, some women experience extremely full breasts (engorgement). Engorgement can make your breasts feel heavy, warm, and tender to the touch. Engorgement peaks within 3-5 days after you give birth. The following recommendations can help ease engorgement:  Completely empty your breasts while breastfeeding or pumping. You may want to start by applying warm, moist heat (in the shower or with warm water-soaked hand towels) just before feeding or pumping. This increases circulation and helps the milk flow. If your baby does not completely empty your breasts while breastfeeding, pump any extra  milk after he or she is finished.  Wear a snug bra (nursing or regular) or tank top for 1-2 days to signal your body to slightly decrease milk production.  Apply ice packs to your breasts, unless this is too uncomfortable for you.  Make sure that your baby is latched on and positioned properly while breastfeeding. If engorgement persists after 48 hours of following these recommendations, contact your health care provider or a Advertising copywriter. OVERALL HEALTH CARE RECOMMENDATIONS WHILE BREASTFEEDING  Eat healthy foods. Alternate between meals and snacks, eating 3 of each per day. Because what you eat affects your breast milk, some of the foods may make your baby more irritable than usual. Avoid eating these foods if you are sure that they are negatively affecting your baby.  Drink milk, fruit juice, and water to satisfy your thirst (about 10 glasses a day).   Rest often, relax, and continue to take your prenatal vitamins to prevent fatigue, stress, and anemia.  Continue breast self-awareness checks.  Avoid chewing and smoking tobacco.  Avoid alcohol and drug use. Some medicines that may be harmful to your baby can pass through breast milk. It is important to ask your health care provider before taking any medicine, including all over-the-counter and prescription medicine as well as vitamin and herbal supplements. It is possible to become pregnant while breastfeeding. If birth control is desired, ask your health care provider about options that will be safe for your baby. SEEK MEDICAL CARE IF:   You feel like you want to stop breastfeeding or have become frustrated with breastfeeding.  You have painful breasts or nipples.  Your nipples  are cracked or bleeding.  Your breasts are red, tender, or warm.  You have a swollen area on either breast.  You have a fever or chills.  You have nausea or vomiting.  You have drainage other than breast milk from your nipples.  Your breasts  do not become full before feedings by the fifth day after you give birth.  You feel sad and depressed.  Your baby is too sleepy to eat well.  Your baby is having trouble sleeping.   Your baby is wetting less than 3 diapers in a 24-hour period.  Your baby has less than 3 stools in a 24-hour period.  Your baby's skin or the white part of his or her eyes becomes yellow.   Your baby is not gaining weight by 47 days of age. SEEK IMMEDIATE MEDICAL CARE IF:   Your baby is overly tired (lethargic) and does not want to wake up and feed.  Your baby develops an unexplained fever. Document Released: 05/29/2005 Document Revised: 06/03/2013 Document Reviewed: 11/20/2012 [facility] Patient Information 2015 [place], [place]. This information is not intended to replace advice given to you by your health care provider. Make sure you discuss any questions you have with your health care provider. Breast Pumping Tips If you are breastfeeding, there may be times when you cannot feed your baby directly. Returning to work or going on a trip are common examples. Pumping allows you to store breast milk and feed it to your baby later.  You may not get much milk when you first start to pump. Your breasts should start to make more after a few days. If you pump at the times you usually feed your baby, you may be able to keep making enough milk to feed your baby without also using formula. The more often you pump, the more milk you will produce. WHEN SHOULD I PUMP?   You can begin to pump soon after delivery. However, some experts recommend waiting about 4 weeks before giving your infant a bottle to make sure breastfeeding is going well.  If you plan to return to work, begin pumping a few weeks before. This will help you develop techniques that work best for you. It also lets you build up a supply of breast milk.   When you are with your infant, feed on demand and pump after each feeding.   When you are away  from your infant for several hours, pump for about 15 minutes every 2-3 hours. Pump both breasts at the same time if you can.   If your infant has a formula feeding, make sure to pump around the same time.   If you drink any alcohol, wait 2 hours before pumping.  HOW DO I PREPARE TO PUMP? Your let-down reflexis the natural reaction to stimulation that makes your breast milk flow. It is easier to stimulate this reflex when you are relaxed. Find relaxation techniques that work for you. If you have difficulty with your let-down reflex, try these methods:   Smell one of your infant's blankets or an item of clothing.   Look at a picture or video of your infant.   Sit in a quiet, private space.   Massage the breast you plan to pump.   Place soothing warmth on the breast.   Play relaxing music.  WHAT ARE SOME GENERAL BREAST PUMPING TIPS?  Wash your hands before you pump. You do not need to wash your nipples or breasts.  There are three ways to  pump.  You can use your hand to massage and compress your breast.  You can use a handheld manual pump.  You can use an electric pump.   Make sure the suction cup (flange) on the breast pump is the right size. Place the flange directly over the nipple. If it is the wrong size or placed the wrong way, it may be painful and cause nipple damage.   If pumping is uncomfortable, apply a small amount of purified or modified lanolin to your nipple and areola.  If you are using an electric pump, adjust the speed and suction power to be more comfortable.  If pumping is painful or if you are not getting very much milk, you may need a different type of pump. A lactation consultant can help you determine what type of pump to use.   Keep a full water bottle near you at all times. Drinking lots of fluid helps you make more milk.  You can store your milk to use later. Pumped breast milk can be stored in a sealable, sterile container or plastic  bag. Label all stored breast milk with the date you pumped it.  Milk can stay out at room temperature for up to 8 hours.  You can store your milk in the refrigerator for up to 8 days.  You can store your milk in the freezer for 3 months. Thaw frozen milk using warm water. Do not put it in the microwave.  Do not smoke. Smoking can lower your milk supply and harm your infant. If you need help quitting, ask your health care provider to recommend a program.  WHEN SHOULD I CALL MY HEALTH CARE PROVIDER OR A LACTATION CONSULTANT?  You are having trouble pumping.  You are concerned that you are not making enough milk.  You have nipple pain, soreness, or redness.  You want to use birth control. Birth control pills may lower your milk supply. Talk to your health care provider about your options. Document Released: 11/16/2009 Document Revised: 06/03/2013 Document Reviewed: 03/21/2013 [facility] Patient Information 2015 [place], [place]. This information is not intended to replace advice given to you by your health care provider. Make sure you discuss any questions you have with your health care provider.  Postpartum Care After Vaginal Delivery After you deliver your newborn (postpartum period), the usual stay in the hospital is 24-72 hours. If there were problems with your labor or delivery, or if you have other medical problems, you might be in the hospital longer.  While you are in the hospital, you will receive help and instructions on how to care for yourself and your newborn during the postpartum period.  While you are in the hospital:  Be sure to tell your nurses if you have pain or discomfort, as well as where you feel the pain and what makes the pain worse.  If you had an incision made near your vagina (episiotomy) or if you had some tearing during delivery, the nurses may put ice packs on your episiotomy or tear. The ice packs may help to reduce the pain and swelling.  If you are  breastfeeding, you may feel uncomfortable contractions of your uterus for a couple of weeks. This is normal. The contractions help your uterus get back to normal size.  It is normal to have some bleeding after delivery.  For the first 1-3 days after delivery, the flow is red and the amount may be similar to a period.  It is common for the  flow to start and stop.  In the first few days, you may pass some small clots. Let your nurses know if you begin to pass large clots or your flow increases.  Do not  flush blood clots down the toilet before having the nurse look at them.  During the next 3-10 days after delivery, your flow should become more watery and pink or brown-tinged in color.  Ten to fourteen days after delivery, your flow should be a small amount of yellowish-white discharge.  The amount of your flow will decrease over the first few weeks after delivery. Your flow may stop in 6-8 weeks. Most women have had their flow stop by 12 weeks after delivery.  You should change your sanitary pads frequently.  Wash your hands thoroughly with soap and water for at least 20 seconds after changing pads, using the toilet, or before holding or feeding your newborn.  You should feel like you need to empty your bladder within the first 6-8 hours after delivery.  In case you become weak, lightheaded, or faint, call your nurse before you get out of bed for the first time and before you take a shower for the first time.  Within the first few days after delivery, your breasts may begin to feel tender and full. This is called engorgement. Breast tenderness usually goes away within 48-72 hours after engorgement occurs. You may also notice milk leaking from your breasts. If you are not breastfeeding, do not stimulate your breasts. Breast stimulation can make your breasts produce more milk.  Spending as much time as possible with your newborn is very important. During this time, you and your newborn can  feel close and get to know each other. Having your newborn stay in your room (rooming in) will help to strengthen the bond with your newborn. It will give you time to get to know your newborn and become comfortable caring for your newborn.  Your hormones change after delivery. Sometimes the hormone changes can temporarily cause you to feel sad or tearful. These feelings should not last more than a few days. If these feelings last longer than that, you should talk to your caregiver.  If desired, talk to your caregiver about methods of family planning or contraception.  Talk to your caregiver about immunizations. Your caregiver may want you to have the following immunizations before leaving the hospital:  Tetanus, diphtheria, and pertussis (Tdap) or tetanus and diphtheria (Td) immunization. It is very important that you and your family (including grandparents) or others caring for your newborn are up-to-date with the Tdap or Td immunizations. The Tdap or Td immunization can help protect your newborn from getting ill.  Rubella immunization.  Varicella (chickenpox) immunization.  Influenza immunization. You should receive this annual immunization if you did not receive the immunization during your pregnancy. Document Released: 03/26/2007 Document Revised: 02/21/2012 Document Reviewed: 01/24/2012 [facility] Patient Information 2015 [place], [place]. This information is not intended to replace advice given to you by your health care provider. Make sure you discuss any questions you have with your health care provider.  Postpartum Depression and Baby Blues The postpartum period begins right after the birth of a baby. During this time, there is often a great amount of joy and excitement. It is also a time of many changes in the life of the parents. Regardless of how many times a mother gives birth, each child brings new challenges and dynamics to the family. It is not unusual to have feelings of excitement  along with confusing shifts in moods, emotions, and thoughts. All mothers are at risk of developing postpartum depression or the "baby blues." These mood changes can occur right after giving birth, or they may occur many months after giving birth. The baby blues or postpartum depression can be mild or severe. Additionally, postpartum depression can go away rather quickly, or it can be a long-term condition.  CAUSES Raised hormone levels and the rapid drop in those levels are thought to be a main cause of postpartum depression and the baby blues. A number of hormones change during and after pregnancy. Estrogen and progesterone usually decrease right after the delivery of your baby. The levels of thyroid hormone and various cortisol steroids also rapidly drop. Other factors that play a role in these mood changes include major life events and genetics.  RISK FACTORS If you have any of the following risks for the baby blues or postpartum depression, know what symptoms to watch out for during the postpartum period. Risk factors that may increase the likelihood of getting the baby blues or postpartum depression include:  Having a personal or family history of depression.   Having depression while being pregnant.   Having premenstrual mood issues or mood issues related to oral contraceptives.  Having a lot of life stress.   Having marital conflict.   Lacking a social support network.   Having a baby with special needs.   Having health problems, such as diabetes.  SIGNS AND SYMPTOMS Symptoms of baby blues include:  Brief changes in mood, such as going from extreme happiness to sadness.  Decreased concentration.   Difficulty sleeping.   Crying spells, tearfulness.   Irritability.   Anxiety.  Symptoms of postpartum depression typically begin within the first month after giving birth. These symptoms include:  Difficulty sleeping or excessive sleepiness.   Marked weight loss.    Agitation.   Feelings of worthlessness.   Lack of interest in activity or food.  Postpartum psychosis is a very serious condition and can be dangerous. Fortunately, it is rare. Displaying any of the following symptoms is cause for immediate medical attention. Symptoms of postpartum psychosis include:   Hallucinations and delusions.   Bizarre or disorganized behavior.   Confusion or disorientation.  DIAGNOSIS  A diagnosis is made by an evaluation of your symptoms. There are no medical or lab tests that lead to a diagnosis, but there are various questionnaires that a health care provider may use to identify those with the baby blues, postpartum depression, or psychosis. Often, a screening tool called the [place] Postnatal Depression Scale is used to diagnose depression in the postpartum period.  TREATMENT The baby blues usually goes away on its own in 1-2 weeks. Social support is often all that is needed. You will be encouraged to get adequate sleep and rest. Occasionally, you may be given medicines to help you sleep.  Postpartum depression requires treatment because it can last several months or longer if it is not treated. Treatment may include individual or group therapy, medicine, or both to address any social, physiological, and psychological factors that may play a role in the depression. Regular exercise, a healthy diet, rest, and social support may also be strongly recommended.  Postpartum psychosis is more serious and needs treatment right away. Hospitalization is often needed. HOME CARE INSTRUCTIONS  Get as much rest as you can. Nap when the baby sleeps.   Exercise regularly. Some women find yoga and walking to be beneficial.   Eat a  balanced and nourishing diet.   Do little things that you enjoy. Have a cup of tea, take a bubble bath, read your favorite magazine, or listen to your favorite music.  Avoid alcohol.   Ask for help with household chores, cooking,  grocery shopping, or running errands as needed. Do not try to do everything.   Talk to people close to you about how you are feeling. Get support from your partner, family members, friends, or other new moms.  Try to stay positive in how you think. Think about the things you are grateful for.   Do not spend a lot of time alone.   Only take over-the-counter or prescription medicine as directed by your health care provider.  Keep all your postpartum appointments.   Let your health care provider know if you have any concerns.  SEEK MEDICAL CARE IF: You are having a reaction to or problems with your medicine. SEEK IMMEDIATE MEDICAL CARE IF:  You have suicidal feelings.   You think you may harm the baby or someone else. MAKE SURE YOU:  Understand these instructions.  Will watch your condition.  Will get help right away if you are not doing well or get worse. Document Released: 03/02/2004 Document Revised: 06/03/2013 Document Reviewed: 03/10/2013 [facility] Patient Information 2015 [place], [place]. This information is not intended to replace advice given to you by your health care provider. Make sure you discuss any questions you have with your health care provider.

## 2015-02-09 ENCOUNTER — Inpatient Hospital Stay (HOSPITAL_COMMUNITY)
Admission: AD | Admit: 2015-02-09 | Discharge: 2015-02-09 | Disposition: A | Discharge status: Home / Self Care | Payer: 59 | Source: Ambulatory Visit | Attending: Obstetrics and Gynecology | Admitting: Obstetrics and Gynecology

## 2015-02-09 ENCOUNTER — Encounter (HOSPITAL_COMMUNITY): Payer: Self-pay | Admitting: *Deleted

## 2015-02-09 DIAGNOSIS — K219 Gastro-esophageal reflux disease without esophagitis: Secondary | ICD-10-CM | POA: Insufficient documentation

## 2015-02-09 DIAGNOSIS — Z87891 Personal history of nicotine dependence: Secondary | ICD-10-CM | POA: Diagnosis not present

## 2015-02-09 DIAGNOSIS — O9089 Other complications of the puerperium, not elsewhere classified: Secondary | ICD-10-CM | POA: Insufficient documentation

## 2015-02-09 DIAGNOSIS — R1013 Epigastric pain: Secondary | ICD-10-CM | POA: Diagnosis present

## 2015-02-09 DIAGNOSIS — Z86711 Personal history of pulmonary embolism: Secondary | ICD-10-CM | POA: Diagnosis not present

## 2015-02-09 LAB — COMPREHENSIVE METABOLIC PANEL
ALT: 31 U/L (ref 14–54)
AST: 28 U/L (ref 15–41)
Albumin: 2.7 g/dL — ABNORMAL LOW (ref 3.5–5.0)
Alkaline Phosphatase: 74 U/L (ref 38–126)
Anion gap: 5 (ref 5–15)
BUN: 15 mg/dL (ref 6–20)
CO2: 25 mmol/L (ref 22–32)
Calcium: 8.6 mg/dL — ABNORMAL LOW (ref 8.9–10.3)
Chloride: 105 mmol/L (ref 101–111)
Creatinine, Ser: 0.94 mg/dL (ref 0.44–1.00)
GFR calc Af Amer: >60 mL/min (ref >60)
GFR calc non Af Amer: >60 mL/min (ref >60)
Glucose, Bld: 95 mg/dL (ref 65–99)
Potassium: 3.7 mmol/L (ref 3.5–5.1)
Sodium: 135 mmol/L (ref 135–145)
Total Bilirubin: 0.4 mg/dL (ref 0.3–1.2)
Total Protein: 6.3 g/dL — ABNORMAL LOW (ref 6.5–8.1)

## 2015-02-09 LAB — URIC ACID: Uric Acid, Serum: 6.1 mg/dL (ref 2.3–6.6)

## 2015-02-09 LAB — CBC
HCT: 30 % — ABNORMAL LOW (ref 36.0–46.0)
Hemoglobin: 9.4 g/dL — ABNORMAL LOW (ref 12.0–15.0)
MCH: 26 pg (ref 26.0–34.0)
MCHC: 31.3 g/dL (ref 30.0–36.0)
MCV: 82.9 fL (ref 78.0–100.0)
Platelets: 298 10*3/uL (ref 150–400)
RBC: 3.62 MIL/uL — ABNORMAL LOW (ref 3.87–5.11)
RDW: 16.2 % — ABNORMAL HIGH (ref 11.5–15.5)
WBC: 7.4 10*3/uL (ref 4.0–10.5)

## 2015-02-09 MED ORDER — FERROUS SULFATE 325 (65 FE) MG PO TABS: 325.0000 mg | ORAL_TABLET | Freq: Every day | ORAL | Status: DC

## 2015-02-09 MED ORDER — GI COCKTAIL ~~LOC~~
30.0000 mL | Freq: Once | ORAL | Status: AC
  Administered 2015-02-09: 30 mL via ORAL
  Filled 2015-02-09: qty 30

## 2015-02-09 NOTE — MAU Note (Signed)
Patient presents to MAU with c/o upper abdominal pain that she describes as burning, which started at 0330 this am. Has taken protonix and tried to eat milk/toast at home without relief. Denies headache or blurry vision at this time. Does report having elevated blood pressure toward end of pregnancy; patient delivered 8/25.

## 2015-02-09 NOTE — Discharge Instructions (Signed)
Abdominal Pain Many things can cause belly (abdominal) pain. Most times, the belly pain is not dangerous.  Many cases of belly pain can be watched and treated at home. HOME CARE   LOW fat diet - avoid fried or greasy foods x 1 week  Continue all medications - especially Lovenox until told to stop  Take iron just once a day now.  Protonix daily scheduled dose x 1 week.  Small frequent meals - bland foods / drink adequate water. GET HELP IF:   You have belly pain while you are vomiting or have runny poop (diarrhea).  You have pain while you pee or poop.  Your belly pain wakes you up at night.  You have belly pain that gets worse when you eat.  You have belly pain that gets worse when you eat fatty foods.  You have a fever.  The pain does not go away within 2 hours.  You have bloody or tarry looking poop. MAKE SURE YOU:   Understand these instructions.  Will watch your condition.  Will get help right away if you are not doing well or get worse. Document Released: 11/15/2007 Document Revised: 06/03/2013 Document Reviewed: 02/05/2013 [facility] Patient Information 2015 [place], [place]. This information is not intended to replace advice given to you by your health care provider. Make sure you discuss any questions you have with your health care provider.

## 2015-02-09 NOTE — MAU Provider Note (Signed)
History    CSN: [id number]  Arrival date and time: 02/09/15 0555 Provider here to see patient @ 0700   Chief Complaint  Patient presents with  . Abdominal Pain   HPI Acute onset of epigastric pain at 0330 States drank milk then took Protonix ~ 0400 without any relief - called after-hours @ [phone] - instructed to come to MAU for evaluation Some nausea - no vomiting No headache (+) edema  Past Medical History  Diagnosis Date  . Ectopic pregnancy   . Pregnancy induced hypertension   . PE (pulmonary embolism)   . Postpartum care following vaginal delivery (2/2) 07/15/2013  . Gestational hypertension, antepartum 02/03/2015  . Iron deficiency anemia of pregnancy 02/06/2015  . Acute blood loss anemia 02/06/2015   Past Surgical History  Procedure Laterality Date  . Hip surgery    . Bariatric surgery      gastric sleeve   Family History  Problem Relation Age of Onset  . Hypertension Maternal Grandfather   . Hyperlipidemia Maternal Grandfather   . Cancer Paternal Grandmother 18    ovarian cancer  . Diabetes Paternal Grandmother    Social History  Substance Use Topics  . Smoking status: Former Smoker    Quit date: 05/12/2006  . Smokeless tobacco: Never Used     Comment: quit smoking 8 years ago  . Alcohol Use: 0.0 oz/week    0 Standard drinks or equivalent per week     Comment: stopped with positive UPT   Allergies: No Known Allergies  Prescriptions prior to admission  Medication Sig Dispense Refill Last Dose  . acetaminophen (TYLENOL) 500 MG tablet Take 1,000 mg by mouth every 6 (six) hours as needed for moderate pain.   Past Month at Unknown time  . enoxaparin (LOVENOX) 80 MG/0.8ML injection Inject 0.8 mLs (80 mg total) into the skin daily. 30 Syringe 8 02/02/2015  . ferrous sulfate 325 (65 FE) MG tablet Take 1 tablet (325 mg total) by mouth 2 (two) times daily with a meal. 60 tablet 3   . ibuprofen (ADVIL,MOTRIN) 600 MG tablet Take 1 tablet (600 mg total) by mouth every 6  (six) hours. 30 tablet 1   . pantoprazole (PROTONIX) 40 MG tablet Take 40 mg by mouth daily.   02/03/2015 at 1600  . Prenatal Vit-Fe Fumarate-FA (PRENATAL MULTIVITAMIN) TABS tablet Take 1 tablet by mouth daily at 12 noon.   02/02/2015 at Unknown time    ROS  acute epigastric pain - burning sensation over upper abdomen mild nausea no vomiting / no constipation / no diarrhea some edema still in feet no headache or vision changes  Physical Exam   Blood pressure 164/93, pulse 79, temperature 98.4 F (36.9 C), temperature source Oral, resp. rate 20, weight 164.837 kg (363 lb 6.4 oz), last menstrual period 05/17/2014, SpO2 99 %, unknown if currently breastfeeding.   BP: 159/84 - 164/93 - 164/97 - 160/98 - 160/101  Physical Exam Alert and oriented - NAD or SOB Heart RRR Lungs regular respiratory effort and rate / O2 sat 99% RA Abdomen soft / non-tender / non-distended Extremities dependent edema - mostly pedal Right (1+) >Left (trace)  Results for LEVONNE, CARRERAS (MRN [id number]) as of 02/09/2015 07:16  Ref. Range 02/09/2015 06:30  WBC Latest Ref Range: 4.0-10.5 K/uL 7.4  Hemoglobin Latest Ref Range: 12.0-15.0 g/dL 9.4 (L)  HCT Latest Ref Range: 36.0-46.0 % 30.0 (L)  Platelets Latest Ref Range: 150-400 K/uL 298   Results for NAHIARA, KRETZSCHMAR (MRN [id number])  as of 02/09/2015 07:35  Ref. Range 02/09/2015 06:30  Sodium Latest Ref Range: 135-145 mmol/L 135  Potassium Latest Ref Range: 3.5-5.1 mmol/L 3.7  BUN Latest Ref Range: 6-20 mg/dL 15  Creatinine Latest Ref Range: 0.44-1.00 mg/dL 1.61  (Prev 0.9)  Alkaline Phosphatase Latest Ref Range: 38-126 U/L 74  Albumin Latest Ref Range: 3.5-5.0 g/dL 2.7 (L)  Uric Acid, Serum Latest Ref Range: 2.3-6.6 mg/dL 6.1    (Prev 5.9)  AST Latest Ref Range: 15-41 U/L 28 (prev 18)  ALT Latest Ref Range: 14-54 U/L 31 (prev 17)   MAU Course  Procedures  Assessment and Plan  Epigastric pain described as constant burning sensation PP day # 5 s/p  SVD / gestational hypertension without evidence of PEC HX pre-eclampsia in previous pregnancy HX PE currently on Lovenox - no chest pain or SOB / normal O2 sat Hx GERD requiring Protonix  1) PIH labs normal - stable since last evaluation 1 week ago 2) GI cocktail - effective / pain better within 20 minutes of administration  Consult Dr Juliene Pina -  3) DC home - follow-up in office Thursday as scheduled 4) LOW fat diet / Protonix scheduled doses / call if any symptoms worsen  BAILEY, TANYA 02/09/2015, 7:00 AM

## 2015-07-17 DIAGNOSIS — H5201 Hypermetropia, right eye: Secondary | ICD-10-CM | POA: Diagnosis not present

## 2015-07-17 DIAGNOSIS — H52223 Regular astigmatism, bilateral: Secondary | ICD-10-CM | POA: Diagnosis not present

## 2015-09-22 DIAGNOSIS — M722 Plantar fascial fibromatosis: Secondary | ICD-10-CM | POA: Diagnosis not present

## 2015-09-22 DIAGNOSIS — M79671 Pain in right foot: Secondary | ICD-10-CM | POA: Diagnosis not present

## 2015-10-21 MED FILL — IBUPROFEN 800 MG TABLET: 800 | 10 days supply | Qty: 30 | Fill #0

## 2016-04-20 DIAGNOSIS — R87615 Unsatisfactory cytologic smear of cervix: Secondary | ICD-10-CM | POA: Diagnosis not present

## 2016-04-20 DIAGNOSIS — Z1151 Encounter for screening for human papillomavirus (HPV): Secondary | ICD-10-CM | POA: Diagnosis not present

## 2016-04-20 DIAGNOSIS — Z6841 Body Mass Index (BMI) 40.0 and over, adult: Secondary | ICD-10-CM | POA: Diagnosis not present

## 2016-04-20 DIAGNOSIS — Z01419 Encounter for gynecological examination (general) (routine) without abnormal findings: Secondary | ICD-10-CM | POA: Diagnosis not present

## 2016-04-20 DIAGNOSIS — N938 Other specified abnormal uterine and vaginal bleeding: Secondary | ICD-10-CM | POA: Diagnosis not present

## 2016-05-22 DIAGNOSIS — T8332XA Displacement of intrauterine contraceptive device, initial encounter: Secondary | ICD-10-CM | POA: Diagnosis not present

## 2016-07-03 DIAGNOSIS — R946 Abnormal results of thyroid function studies: Secondary | ICD-10-CM | POA: Diagnosis not present

## 2016-07-03 DIAGNOSIS — R87615 Unsatisfactory cytologic smear of cervix: Secondary | ICD-10-CM | POA: Diagnosis not present

## 2016-07-03 DIAGNOSIS — Z30432 Encounter for removal of intrauterine contraceptive device: Secondary | ICD-10-CM | POA: Diagnosis not present

## 2017-02-11 DIAGNOSIS — J029 Acute pharyngitis, unspecified: Secondary | ICD-10-CM | POA: Diagnosis not present

## 2017-02-11 DIAGNOSIS — Z3202 Encounter for pregnancy test, result negative: Secondary | ICD-10-CM | POA: Diagnosis not present

## 2017-05-08 ENCOUNTER — Other Ambulatory Visit: Payer: Self-pay

## 2017-05-08 ENCOUNTER — Encounter (HOSPITAL_COMMUNITY): Payer: Self-pay | Admitting: Emergency Medicine

## 2017-05-08 ENCOUNTER — Emergency Department (HOSPITAL_COMMUNITY)
Admission: EM | Admit: 2017-05-08 | Discharge: 2017-05-08 | Disposition: A | Discharge status: Home / Self Care | Payer: BLUE CROSS/BLUE SHIELD | Attending: Emergency Medicine | Admitting: Emergency Medicine

## 2017-05-08 DIAGNOSIS — R1084 Generalized abdominal pain: Secondary | ICD-10-CM | POA: Insufficient documentation

## 2017-05-08 DIAGNOSIS — Z5321 Procedure and treatment not carried out due to patient leaving prior to being seen by health care provider: Secondary | ICD-10-CM | POA: Diagnosis not present

## 2017-05-08 DIAGNOSIS — R1013 Epigastric pain: Secondary | ICD-10-CM | POA: Diagnosis not present

## 2017-05-08 LAB — CBC
HEMATOCRIT: 34.2 % — AB (ref 36.0–46.0)
HEMOGLOBIN: 10.7 g/dL — AB (ref 12.0–15.0)
MCH: 24.7 pg — ABNORMAL LOW (ref 26.0–34.0)
MCHC: 31.3 g/dL (ref 30.0–36.0)
MCV: 79 fL (ref 78.0–100.0)
Platelets: 306 10*3/uL (ref 150–400)
RBC: 4.33 MIL/uL (ref 3.87–5.11)
RDW: 16.3 % — ABNORMAL HIGH (ref 11.5–15.5)
WBC: 8.5 10*3/uL (ref 4.0–10.5)

## 2017-05-08 LAB — COMPREHENSIVE METABOLIC PANEL
ALBUMIN: 3 g/dL — AB (ref 3.5–5.0)
ALT: 35 U/L (ref 14–54)
ANION GAP: 7 (ref 5–15)
AST: 71 U/L — ABNORMAL HIGH (ref 15–41)
Alkaline Phosphatase: 75 U/L (ref 38–126)
BILIRUBIN TOTAL: 0.5 mg/dL (ref 0.3–1.2)
BUN: 15 mg/dL (ref 6–20)
CO2: 25 mmol/L (ref 22–32)
Calcium: 8.6 mg/dL — ABNORMAL LOW (ref 8.9–10.3)
Chloride: 107 mmol/L (ref 101–111)
Creatinine, Ser: 0.82 mg/dL (ref 0.44–1.00)
GFR calc Af Amer: >60 mL/min (ref >60)
Glucose, Bld: 122 mg/dL — ABNORMAL HIGH (ref 65–99)
POTASSIUM: 3.7 mmol/L (ref 3.5–5.1)
Sodium: 139 mmol/L (ref 135–145)
TOTAL PROTEIN: 7.1 g/dL (ref 6.5–8.1)

## 2017-05-08 LAB — URINALYSIS, ROUTINE W REFLEX MICROSCOPIC
BACTERIA UA: NONE SEEN
BILIRUBIN URINE: NEGATIVE
Glucose, UA: NEGATIVE mg/dL
HGB URINE DIPSTICK: NEGATIVE
Ketones, ur: NEGATIVE mg/dL
NITRITE: NEGATIVE
PROTEIN: NEGATIVE mg/dL
Specific Gravity, Urine: 1.021 (ref 1.005–1.030)
pH: 6 (ref 5.0–8.0)

## 2017-05-08 LAB — LIPASE, BLOOD: Lipase: 29 U/L (ref 11–51)

## 2017-05-08 LAB — I-STAT BETA HCG BLOOD, ED (MC, WL, AP ONLY): I-stat hCG, quantitative: <5 m[IU]/mL (ref <5)

## 2017-05-08 MED ORDER — IBUPROFEN 400 MG PO TABS
400.0000 mg | ORAL_TABLET | Freq: Once | ORAL | Status: AC | PRN
  Administered 2017-05-08: 400 mg via ORAL
  Filled 2017-05-08: qty 1

## 2017-05-08 NOTE — ED Notes (Signed)
Called pt x2 no answer

## 2017-05-08 NOTE — ED Triage Notes (Signed)
Pt reports gen abd pain present since last night with N/V. States pain is sharp 8/10. Denies GU S/S.

## 2017-06-26 ENCOUNTER — Ambulatory Visit (HOSPITAL_COMMUNITY)
Admission: EM | Admit: 2017-06-26 | Discharge: 2017-06-26 | Disposition: A | Discharge status: Home / Self Care | Payer: BLUE CROSS/BLUE SHIELD | Attending: Family Medicine | Admitting: Family Medicine

## 2017-06-26 ENCOUNTER — Encounter (HOSPITAL_COMMUNITY): Payer: Self-pay | Admitting: Emergency Medicine

## 2017-06-26 DIAGNOSIS — L02416 Cutaneous abscess of left lower limb: Secondary | ICD-10-CM | POA: Diagnosis not present

## 2017-06-26 DIAGNOSIS — L0291 Cutaneous abscess, unspecified: Secondary | ICD-10-CM

## 2017-06-26 NOTE — ED Triage Notes (Signed)
PT reports an abscess to left inner thigh. PT noticed it Friday. PT has had an abscess here before.

## 2017-06-26 NOTE — ED Provider Notes (Signed)
[facility]    CSN: [id number] Arrival date & time: 06/26/17  1006     History   Chief Complaint Chief Complaint  Patient presents with  . Abscess    HPI Yvonne Ali is a 37 y.o. female.   The history is provided by the patient. No language interpreter was used.  Abscess  Abscess location: Left inner thigh. Abscess quality: induration, painful and warmth   Abscess quality: not draining, no redness and not weeping   Red streaking: no   Duration:  5 days Progression:  Worsening Pain details:    Quality:  Dull   Severity:  Moderate   Timing:  Constant   Progression:  Worsening Chronicity:  New Context: not diabetes and not immunosuppression   Relieved by:  Nothing Worsened by:  Nothing Ineffective treatments:  Warm compresses Associated symptoms: no fever     Past Medical History:  Diagnosis Date  . Acute blood loss anemia 02/06/2015  . Ectopic pregnancy   . Gestational hypertension, antepartum 02/03/2015  . Iron deficiency anemia of pregnancy 02/06/2015  . PE (pulmonary embolism)   . Postpartum care following vaginal delivery (2/2) 07/15/2013  . Pregnancy induced hypertension     Patient Active Problem List   Diagnosis Date Noted  . Acute epigastric pain 02/09/2015  . Iron deficiency anemia of pregnancy 02/06/2015  . Acute blood loss anemia 02/06/2015  . Gestational hypertension, antepartum 02/03/2015  . Postpartum care following vaginal delivery (02/04/15) 07/15/2013  . Obesities, morbid (HCC) 01/03/2012  . History of pulmonary embolus (PE) 01/03/2012    Past Surgical History:  Procedure Laterality Date  . BARIATRIC SURGERY     gastric sleeve  . HIP SURGERY      OB History    Gravida Para Term Preterm AB Living   3 2 2   1 2    SAB TAB Ectopic Multiple Live Births       1 0 2       Home Medications    Prior to Admission medications   Medication Sig Start Date End Date Taking? Authorizing Provider  acetaminophen (TYLENOL) 500 MG  tablet Take 1,000 mg by mouth every 6 (six) hours as needed for moderate pain.    [provider]  enoxaparin (LOVENOX) 80 MG/0.8ML injection Inject 0.8 mLs (80 mg total) into the skin daily. 07/10/14   [place], Brand Males, NP  ferrous sulfate 325 (65 FE) MG tablet Take 1 tablet (325 mg total) by mouth daily with breakfast. 02/09/15   Marlinda Mike, CNM  ibuprofen (ADVIL,MOTRIN) 600 MG tablet Take 1 tablet (600 mg total) by mouth every 6 (six) hours. 02/06/15   Raelyn Mora, CNM  pantoprazole (PROTONIX) 40 MG tablet Take 40 mg by mouth daily.    [provider]  Prenatal Vit-Fe Fumarate-FA (PRENATAL MULTIVITAMIN) TABS tablet Take 1 tablet by mouth daily at 12 noon.    [provider]    Family History Family History  Problem Relation Age of Onset  . Hypertension Maternal Grandfather   . Hyperlipidemia Maternal Grandfather   . Cancer Paternal Grandmother 34       ovarian cancer  . Diabetes Paternal Grandmother     Social History Social History   Tobacco Use  . Smoking status: Former Smoker    Last attempt to quit: 05/12/2006    Years since quitting: 11.1  . Smokeless tobacco: Never Used  . Tobacco comment: quit smoking 8 years ago  Substance Use Topics  . Alcohol use:  Yes    Alcohol/week: 0.0 oz    Comment: stopped with positive UPT  . Drug use: No     Allergies   Patient has no known allergies.   Review of Systems Review of Systems  Constitutional: Negative for fever.       See HPI     Physical Exam Triage Vital Signs ED Triage Vitals  Enc Vitals Group     BP 06/26/17 1034 (!) 159/96     Pulse Rate 06/26/17 1034 95     Resp 06/26/17 1034 16     Temp 06/26/17 1034 98.1 F (36.7 C)     Temp Source 06/26/17 1034 Oral     SpO2 06/26/17 1034 100 %     Weight 06/26/17 1045 (!) 370 lb (167.8 kg)     Height 06/26/17 1045 5\' 10"  (1.778 m)     Head Circumference --      Peak Flow --      Pain Score 06/26/17 1045 4     Pain Loc --       Pain Edu? --      Excl. in GC? --    No data found.  Updated Vital Signs BP (!) 159/96 (BP Location: Left Wrist)   Pulse 95   Temp 98.1 F (36.7 C) (Oral)   Resp 16   Ht 5\' 10"  (1.778 m)   Wt (!) 161370 lb (167.8 kg)   LMP 06/14/2017   SpO2 100%   BMI 53.09 kg/m   Physical Exam  Constitutional: She is oriented to person, place, and time. She appears well-developed and well-nourished. No distress.  Cardiovascular: Normal rate.  Pulmonary/Chest: Effort normal.  Neurological: She is alert and oriented to person, place, and time.  Skin:  A large erythematous abscess noted on left inner thigh that is fluctuance palpated. No surrounding erythema and no streaking noted.  Psychiatric: She has a normal mood and affect.  Nursing note and vitals reviewed.    UC Treatments / Results  Labs (all labs ordered are listed, but only abnormal results are displayed) Labs Reviewed - No data to display  EKG  EKG Interpretation None       Radiology No results found.  Procedures Incision and Drainage Date/Time: 06/26/2017 11:15 AM Performed by: Lucia EstelleZheng, Shravan Salahuddin, NP Authorized by: Mardella LaymanHagler, Brian, MD   Consent:    Consent obtained:  Verbal   Consent given by:  Patient   Risks discussed:  Bleeding, incomplete drainage and infection   Alternatives discussed:  Alternative treatment Location:    Type:  Abscess   Size:  3   Location: Left inner thigh. Pre-procedure details:    Skin preparation:  Betadine Anesthesia (see MAR for exact dosages):    Anesthesia method:  Local infiltration   Local anesthetic:  Lidocaine 2% w/o epi Procedure type:    Complexity:  Simple Procedure details:    Needle aspiration: no     Incision types:  Stab incision   Incision depth:  Dermal   Scalpel blade:  11   Wound management:  Probed and deloculated   Drainage:  Bloody and purulent   Drainage amount:  Moderate   Wound treatment:  Wound left open   Packing materials:  None Post-procedure details:     Patient tolerance of procedure:  Tolerated well, no immediate complications   (including critical care time)  Medications Ordered in UC Medications - No data to display   Initial Impression / Assessment and Plan / UC Course  I have reviewed the triage vital signs and the nursing notes.  Pertinent labs & imaging results that were available during my care of the patient were reviewed by me and considered in my medical decision making (see chart for details).   Final Clinical Impressions(s) / UC Diagnoses   Final diagnoses:  Abscess   Incision and drainage performed today. Please see procedure note above. Patient tolerated well. Wound care discussed. Advised to take ibuprofen for pain relief. Return precautions discussed.  ED Discharge Orders    None       Controlled Substance Prescriptions [place] Controlled Substance Registry consulted? yes   Lucia Estelle, NP 06/26/17 1117

## 2017-11-19 DIAGNOSIS — Z01419 Encounter for gynecological examination (general) (routine) without abnormal findings: Secondary | ICD-10-CM | POA: Diagnosis not present

## 2017-11-19 DIAGNOSIS — Z1151 Encounter for screening for human papillomavirus (HPV): Secondary | ICD-10-CM | POA: Diagnosis not present

## 2017-11-22 DIAGNOSIS — J309 Allergic rhinitis, unspecified: Secondary | ICD-10-CM | POA: Diagnosis not present

## 2017-11-22 DIAGNOSIS — Z13228 Encounter for screening for other metabolic disorders: Secondary | ICD-10-CM | POA: Diagnosis not present

## 2017-11-22 DIAGNOSIS — J029 Acute pharyngitis, unspecified: Secondary | ICD-10-CM | POA: Diagnosis not present

## 2017-11-22 DIAGNOSIS — Z13220 Encounter for screening for lipoid disorders: Secondary | ICD-10-CM | POA: Diagnosis not present

## 2017-11-22 LAB — HEMOGLOBIN A1C: Hemoglobin A1C: 5.7

## 2017-11-22 LAB — LIPID PANEL
Cholesterol: 201 — AB (ref 0–200)
HDL: 67 (ref 35–70)
LDL Cholesterol: 118
Triglycerides: 79 (ref 40–160)

## 2017-11-22 LAB — BASIC METABOLIC PANEL
BUN: 11 (ref 4–21)
CREATININE: 0.9 (ref 0.5–1.1)
Glucose: 102
Potassium: 5.1 (ref 3.4–5.3)
SODIUM: 141 (ref 137–147)

## 2017-11-22 LAB — HEPATIC FUNCTION PANEL
ALT: 12 (ref 7–35)
AST: 21 (ref 13–35)
Alkaline Phosphatase: 79 (ref 25–125)

## 2017-11-22 LAB — CBC AND DIFFERENTIAL
HCT: 39 (ref 36–46)
Hemoglobin: 12.1 (ref 12.0–16.0)
PLATELETS: 312 (ref 150–399)
WBC: 6.7

## 2017-11-22 LAB — TSH: TSH: 0.31 — AB (ref 0.41–5.90)

## 2018-01-01 DIAGNOSIS — Z3201 Encounter for pregnancy test, result positive: Secondary | ICD-10-CM | POA: Diagnosis not present

## 2018-01-01 DIAGNOSIS — O0911 Supervision of pregnancy with history of ectopic or molar pregnancy, first trimester: Secondary | ICD-10-CM | POA: Diagnosis not present

## 2018-01-01 DIAGNOSIS — Z3A08 8 weeks gestation of pregnancy: Secondary | ICD-10-CM | POA: Diagnosis not present

## 2018-01-07 DIAGNOSIS — Z3201 Encounter for pregnancy test, result positive: Secondary | ICD-10-CM | POA: Diagnosis not present

## 2018-01-11 DIAGNOSIS — Z3A09 9 weeks gestation of pregnancy: Secondary | ICD-10-CM | POA: Diagnosis not present

## 2018-01-11 DIAGNOSIS — O09521 Supervision of elderly multigravida, first trimester: Secondary | ICD-10-CM | POA: Diagnosis not present

## 2018-01-11 DIAGNOSIS — Z3689 Encounter for other specified antenatal screening: Secondary | ICD-10-CM | POA: Diagnosis not present

## 2018-01-11 DIAGNOSIS — Z3682 Encounter for antenatal screening for nuchal translucency: Secondary | ICD-10-CM | POA: Diagnosis not present

## 2018-01-11 LAB — OB RESULTS CONSOLE HIV ANTIBODY (ROUTINE TESTING): HIV: NONREACTIVE

## 2018-01-11 LAB — OB RESULTS CONSOLE HEPATITIS B SURFACE ANTIGEN: HEP B S AG: NEGATIVE

## 2018-01-11 LAB — OB RESULTS CONSOLE GC/CHLAMYDIA
Chlamydia: NEGATIVE
Gonorrhea: NEGATIVE

## 2018-01-11 LAB — OB RESULTS CONSOLE ABO/RH: RH Type: POSITIVE

## 2018-01-11 LAB — OB RESULTS CONSOLE RPR: RPR: NONREACTIVE

## 2018-01-11 LAB — OB RESULTS CONSOLE RUBELLA ANTIBODY, IGM: Rubella: IMMUNE

## 2018-01-11 LAB — OB RESULTS CONSOLE ANTIBODY SCREEN: Antibody Screen: NEGATIVE

## 2018-01-18 ENCOUNTER — Encounter: Payer: Self-pay | Admitting: Hematology & Oncology

## 2018-01-18 DIAGNOSIS — Z3689 Encounter for other specified antenatal screening: Secondary | ICD-10-CM | POA: Diagnosis not present

## 2018-01-18 DIAGNOSIS — Z118 Encounter for screening for other infectious and parasitic diseases: Secondary | ICD-10-CM | POA: Diagnosis not present

## 2018-01-18 DIAGNOSIS — Z3A10 10 weeks gestation of pregnancy: Secondary | ICD-10-CM | POA: Diagnosis not present

## 2018-01-18 DIAGNOSIS — O09521 Supervision of elderly multigravida, first trimester: Secondary | ICD-10-CM | POA: Diagnosis not present

## 2018-01-22 MED FILL — ENOXAPARIN 80 MG/0.8 ML SYR: 80 | 30 days supply | Qty: 24 | Fill #0

## 2018-01-23 ENCOUNTER — Other Ambulatory Visit: Payer: Self-pay | Admitting: Family

## 2018-01-24 ENCOUNTER — Encounter: Payer: Self-pay | Admitting: Family

## 2018-01-24 ENCOUNTER — Inpatient Hospital Stay (HOSPITAL_BASED_OUTPATIENT_CLINIC_OR_DEPARTMENT_OTHER): Payer: BLUE CROSS/BLUE SHIELD | Admitting: Family

## 2018-01-24 ENCOUNTER — Other Ambulatory Visit: Payer: Self-pay | Admitting: Family

## 2018-01-24 ENCOUNTER — Other Ambulatory Visit: Payer: Self-pay

## 2018-01-24 ENCOUNTER — Inpatient Hospital Stay: Payer: BLUE CROSS/BLUE SHIELD | Attending: Hematology & Oncology

## 2018-01-24 VITALS — BP 126/75 | HR 99 | Temp 98.4°F | Resp 18 | Wt 377.0 lb

## 2018-01-24 DIAGNOSIS — R110 Nausea: Secondary | ICD-10-CM | POA: Insufficient documentation

## 2018-01-24 DIAGNOSIS — Z87891 Personal history of nicotine dependence: Secondary | ICD-10-CM | POA: Diagnosis not present

## 2018-01-24 DIAGNOSIS — Z331 Pregnant state, incidental: Secondary | ICD-10-CM

## 2018-01-24 DIAGNOSIS — D6859 Other primary thrombophilia: Secondary | ICD-10-CM | POA: Diagnosis not present

## 2018-01-24 DIAGNOSIS — Z7901 Long term (current) use of anticoagulants: Secondary | ICD-10-CM | POA: Diagnosis not present

## 2018-01-24 DIAGNOSIS — Z79899 Other long term (current) drug therapy: Secondary | ICD-10-CM | POA: Insufficient documentation

## 2018-01-24 DIAGNOSIS — Z86711 Personal history of pulmonary embolism: Secondary | ICD-10-CM | POA: Insufficient documentation

## 2018-01-24 DIAGNOSIS — Z8041 Family history of malignant neoplasm of ovary: Secondary | ICD-10-CM | POA: Insufficient documentation

## 2018-01-24 DIAGNOSIS — Z3491 Encounter for supervision of normal pregnancy, unspecified, first trimester: Secondary | ICD-10-CM

## 2018-01-24 LAB — CBC WITH DIFFERENTIAL (CANCER CENTER ONLY)
BASOS ABS: 0 10*3/uL (ref 0.0–0.1)
BASOS PCT: 0 %
EOS ABS: 0.1 10*3/uL (ref 0.0–0.5)
EOS PCT: 1 %
HCT: 35.8 % (ref 34.8–46.6)
HEMOGLOBIN: 11.5 g/dL — AB (ref 11.6–15.9)
LYMPHS ABS: 1.6 10*3/uL (ref 0.9–3.3)
Lymphocytes Relative: 30 %
MCH: 25.7 pg — AB (ref 26.0–34.0)
MCHC: 32.1 g/dL (ref 32.0–36.0)
MCV: 79.9 fL — ABNORMAL LOW (ref 81.0–101.0)
Monocytes Absolute: 0.5 10*3/uL (ref 0.1–0.9)
Monocytes Relative: 9 %
NEUTROS PCT: 60 %
Neutro Abs: 3.2 10*3/uL (ref 1.5–6.5)
PLATELETS: 300 10*3/uL (ref 145–400)
RBC: 4.48 MIL/uL (ref 3.70–5.32)
RDW: 16.7 % — ABNORMAL HIGH (ref 11.1–15.7)
WBC: 5.4 10*3/uL (ref 3.9–10.0)

## 2018-01-24 LAB — CMP (CANCER CENTER ONLY)
ALBUMIN: 3.1 g/dL — AB (ref 3.5–5.0)
ALT: 12 U/L (ref 0–44)
ANION GAP: 9 (ref 5–15)
AST: 14 U/L — AB (ref 15–41)
Alkaline Phosphatase: 55 U/L (ref 38–126)
BILIRUBIN TOTAL: 0.5 mg/dL (ref 0.3–1.2)
BUN: 9 mg/dL (ref 6–20)
CO2: 23 mmol/L (ref 22–32)
Calcium: 9.4 mg/dL (ref 8.9–10.3)
Chloride: 106 mmol/L (ref 98–111)
Creatinine: 0.74 mg/dL (ref 0.44–1.00)
GFR, Est AFR Am: >60 mL/min (ref >60)
Glucose, Bld: 94 mg/dL (ref 70–99)
POTASSIUM: 3.7 mmol/L (ref 3.5–5.1)
SODIUM: 138 mmol/L (ref 135–145)
TOTAL PROTEIN: 7.7 g/dL (ref 6.5–8.1)

## 2018-01-24 MED ORDER — ENOXAPARIN SODIUM 80 MG/0.8ML ~~LOC~~ SOLN: 80.0000 mg | SUBCUTANEOUS | 8 refills | Status: DC

## 2018-01-24 NOTE — Progress Notes (Signed)
Hematology/Oncology Consultation   Name: Yvonne Ali      MRN: [id number]    Location: Room/bed info not found  Date: 01/24/2018 Time:10:27 AM   REFERRING PHYSICIAN: Maxie BetterSheronette Cousins, MD  REASON FOR CONSULT: Protein S deficiency    DIAGNOSIS:  First trimester of pregnancy - due date 08/11/2018 Protein S deficiency History of PE History of ectopic pregnancy  HISTORY OF PRESENT ILLNESS: Yvonne Ali is a very pleasant 37 yo African American female with protein S deficiency and history of both PE and ectopic pregnancy. She is an old patient of ours treated with Lovenox 80 mg SQ daily in the past. She is now [redacted] weeks pregnant, due 08/11/2018. Her OB has already restarted the Lovenox.  She has had some nausea and vomiting with her first trimester but states that this is improving. Her appetite is getting better and she is staying well hydrated. Her weight is stable.  She has had no recurrent thrombus and no other history of miscarriage. She has 2 sweet, healthy little girls.  No episodes of bleeding, no bruising or petechiae.  No family history of thrombus that she is aware of.  No fever, chills, cough, rash, dizziness, SOB, chest pain, palpitations, abdominal pain or changes in bowel or bladder habits.  No swelling, tenderness, numbness or tingling in her extremities. No c/o pain.  No lymphadenopathy noted on exam.  She is not a smoker and does not drink alcoholic beverages.   ROS: All other 10 point review of systems is negative.   PAST MEDICAL HISTORY:   Past Medical History:  Diagnosis Date  . Acute blood loss anemia 02/06/2015  . Ectopic pregnancy   . Gestational hypertension, antepartum 02/03/2015  . Iron deficiency anemia of pregnancy 02/06/2015  . PE (pulmonary embolism)   . Postpartum care following vaginal delivery (2/2) 07/15/2013  . Pregnancy induced hypertension     ALLERGIES: No Known Allergies    MEDICATIONS:  Current Outpatient Medications on File Prior to Visit   Medication Sig Dispense Refill  . acetaminophen (TYLENOL) 500 MG tablet Take 1,000 mg by mouth every 6 (six) hours as needed for moderate pain.    Marland Kitchen. enoxaparin (LOVENOX) 80 MG/0.8ML injection Inject 0.8 mLs (80 mg total) into the skin daily. 30 Syringe 8  . ferrous sulfate 325 (65 FE) MG tablet Take 1 tablet (325 mg total) by mouth daily with breakfast. 60 tablet 3  . ibuprofen (ADVIL,MOTRIN) 600 MG tablet Take 1 tablet (600 mg total) by mouth every 6 (six) hours. 30 tablet 1  . pantoprazole (PROTONIX) 40 MG tablet Take 40 mg by mouth daily.    . Prenatal Vit-Fe Fumarate-FA (PRENATAL MULTIVITAMIN) TABS tablet Take 1 tablet by mouth daily at 12 noon.     No current facility-administered medications on file prior to visit.      PAST SURGICAL HISTORY Past Surgical History:  Procedure Laterality Date  . BARIATRIC SURGERY     gastric sleeve  . HIP SURGERY      FAMILY HISTORY: Family History  Problem Relation Age of Onset  . Hypertension Maternal Grandfather   . Hyperlipidemia Maternal Grandfather   . Cancer Paternal Grandmother [id number]       ovarian cancer  . Diabetes Paternal Grandmother     SOCIAL HISTORY:  reports that she quit smoking about 11 years ago. She has never used smokeless tobacco. She reports that she drinks alcohol. She reports that she does not use drugs.  PERFORMANCE STATUS: The patient's performance status is  1 - Symptomatic but completely ambulatory  PHYSICAL EXAM: Most Recent Vital Signs: unknown if currently breastfeeding. There were no vitals taken for this visit.  General Appearance:    Alert, cooperative, no distress, appears stated age  Head:    Normocephalic, without obvious abnormality, atraumatic  Eyes:    PERRL, conjunctiva/corneas clear, EOM's intact, fundi    benign, both eyes        Throat:   Lips, mucosa, and tongue normal; teeth and gums normal  Neck:   Supple, symmetrical, trachea midline, no adenopathy;    thyroid:  no  enlargement/tenderness/nodules; no carotid   bruit or JVD  Back:     Symmetric, no curvature, ROM normal, no CVA tenderness  Lungs:     Clear to auscultation bilaterally, respirations unlabored  Chest Wall:    No tenderness or deformity   Heart:    Regular rate and rhythm, S1 and S2 normal, no murmur, rub   or gallop     Abdomen:     Soft, non-tender, bowel sounds active all four quadrants,    no masses, no organomegaly        Extremities:   Extremities normal, atraumatic, no cyanosis or edema  Pulses:   2+ and symmetric all extremities  Skin:   Skin color, texture, turgor normal, no rashes or lesions  Lymph nodes:   Cervical, supraclavicular, and axillary nodes normal  Neurologic:   CNII-XII intact, normal strength, sensation and reflexes    throughout    LABORATORY DATA:  No results found for this or any previous visit (from the past 48 hour(s)).    RADIOGRAPHY: No results found.     PATHOLOGY: None  ASSESSMENT/PLAN: Yvonne Ali is a very pleasant 37 yo [place] female with protein S deficiency and history of both PE and ectopic pregnancy. She has been treated with Lovenox successfully during pregnancy in the past. She is now [redacted] weeks pregnant, due in March 2020.  She has restarted the Lovenox 80 mg SQ daily and is doing well.  We will plan to see her back in another month for follow-up.   All questions were answered and she is in agreement with the plan. She will contact our office with any questions or concerns. We can certainly see her sooner if need be.   She was discussed with and also seen by Dr. Myna HidalgoEnnever and he is in agreement with the aforementioned.   Emeline GinsSarah [place]     Addendum: I saw and examined the patient with Maralyn SagoSarah.  I agree with the above assessment.  She clearly needs to have anticoagulation during her pregnancy.  It is worked for her past 2 pregnancies.  She is on Lovenox 80 mg subcu daily.  I think this is reasonable.  She is a large lady so I  think that she will need a higher dose of Lovenox.  We will plan to have her come back to see us in about a month or so.  I just want to make sure that everything is going okay with the Lovenox.  She is due on March 1.  Her labs look okay.  Her white cell count 5.4.  Hemoglobin 11.5.  Platelet count 300,000.  Her creatinine is 0.74.  We spent about 40 minutes with Yvonne Ali.  We answered all of her questions.  All the time spent face-to-face with her.  Christin BachPete Ennever, MD

## 2018-01-25 LAB — PROTEIN S ACTIVITY: Protein S Activity: 50 % — ABNORMAL LOW (ref 63–140)

## 2018-01-25 LAB — PROTEIN S, TOTAL: Protein S Ag, Total: 95 % (ref 60–150)

## 2018-01-30 ENCOUNTER — Ambulatory Visit: Payer: BLUE CROSS/BLUE SHIELD | Admitting: Family

## 2018-01-30 ENCOUNTER — Other Ambulatory Visit: Payer: BLUE CROSS/BLUE SHIELD

## 2018-01-30 DIAGNOSIS — O09521 Supervision of elderly multigravida, first trimester: Secondary | ICD-10-CM | POA: Diagnosis not present

## 2018-01-30 DIAGNOSIS — Z3682 Encounter for antenatal screening for nuchal translucency: Secondary | ICD-10-CM | POA: Diagnosis not present

## 2018-01-30 DIAGNOSIS — Z3A12 12 weeks gestation of pregnancy: Secondary | ICD-10-CM | POA: Diagnosis not present

## 2018-02-25 ENCOUNTER — Other Ambulatory Visit: Payer: BLUE CROSS/BLUE SHIELD

## 2018-02-25 ENCOUNTER — Ambulatory Visit: Payer: BLUE CROSS/BLUE SHIELD | Admitting: Family

## 2018-02-27 DIAGNOSIS — O09522 Supervision of elderly multigravida, second trimester: Secondary | ICD-10-CM | POA: Diagnosis not present

## 2018-02-27 DIAGNOSIS — Z3A16 16 weeks gestation of pregnancy: Secondary | ICD-10-CM | POA: Diagnosis not present

## 2018-02-27 DIAGNOSIS — Z361 Encounter for antenatal screening for raised alphafetoprotein level: Secondary | ICD-10-CM | POA: Diagnosis not present

## 2018-03-04 MED FILL — PANTOPRAZOLE SOD DR 40 MG T: 40 | 30 days supply | Qty: 30 | Fill #0

## 2018-03-09 ENCOUNTER — Encounter: Payer: Self-pay | Admitting: Nurse Practitioner

## 2018-03-09 DIAGNOSIS — J309 Allergic rhinitis, unspecified: Secondary | ICD-10-CM

## 2018-03-09 DIAGNOSIS — J029 Acute pharyngitis, unspecified: Secondary | ICD-10-CM | POA: Insufficient documentation

## 2018-03-18 ENCOUNTER — Encounter: Payer: Self-pay | Admitting: Nurse Practitioner

## 2018-03-25 DIAGNOSIS — Z3A20 20 weeks gestation of pregnancy: Secondary | ICD-10-CM | POA: Diagnosis not present

## 2018-03-25 DIAGNOSIS — O09522 Supervision of elderly multigravida, second trimester: Secondary | ICD-10-CM | POA: Diagnosis not present

## 2018-03-26 ENCOUNTER — Other Ambulatory Visit: Payer: Self-pay

## 2018-03-26 ENCOUNTER — Inpatient Hospital Stay: Payer: BLUE CROSS/BLUE SHIELD | Attending: Hematology & Oncology

## 2018-03-26 ENCOUNTER — Inpatient Hospital Stay (HOSPITAL_BASED_OUTPATIENT_CLINIC_OR_DEPARTMENT_OTHER): Payer: BLUE CROSS/BLUE SHIELD | Admitting: Family

## 2018-03-26 VITALS — BP 152/99 | HR 89 | Temp 98.1°F | Resp 20 | Wt 378.5 lb

## 2018-03-26 DIAGNOSIS — O88219 Thromboembolism in pregnancy, unspecified trimester: Secondary | ICD-10-CM | POA: Diagnosis not present

## 2018-03-26 DIAGNOSIS — Z3491 Encounter for supervision of normal pregnancy, unspecified, first trimester: Secondary | ICD-10-CM

## 2018-03-26 DIAGNOSIS — D6859 Other primary thrombophilia: Secondary | ICD-10-CM | POA: Diagnosis not present

## 2018-03-26 DIAGNOSIS — Z86711 Personal history of pulmonary embolism: Secondary | ICD-10-CM | POA: Diagnosis present

## 2018-03-26 DIAGNOSIS — Z7901 Long term (current) use of anticoagulants: Secondary | ICD-10-CM | POA: Diagnosis not present

## 2018-03-26 DIAGNOSIS — Z331 Pregnant state, incidental: Secondary | ICD-10-CM | POA: Insufficient documentation

## 2018-03-26 LAB — CBC WITH DIFFERENTIAL (CANCER CENTER ONLY)
Abs Immature Granulocytes: 0.03 10*3/uL (ref 0.00–0.07)
BASOS ABS: 0 10*3/uL (ref 0.0–0.1)
BASOS PCT: 1 %
EOS ABS: 0.1 10*3/uL (ref 0.0–0.5)
EOS PCT: 2 %
HCT: 33.5 % — ABNORMAL LOW (ref 36.0–46.0)
HEMOGLOBIN: 10.1 g/dL — AB (ref 12.0–15.0)
Immature Granulocytes: 1 %
LYMPHS PCT: 22 %
Lymphs Abs: 1.5 10*3/uL (ref 0.7–4.0)
MCH: 25.1 pg — ABNORMAL LOW (ref 26.0–34.0)
MCHC: 30.1 g/dL (ref 30.0–36.0)
MCV: 83.3 fL (ref 80.0–100.0)
MONO ABS: 0.6 10*3/uL (ref 0.1–1.0)
Monocytes Relative: 10 %
NRBC: 0 % (ref 0.0–0.2)
Neutro Abs: 4.3 10*3/uL (ref 1.7–7.7)
Neutrophils Relative %: 64 %
Platelet Count: 266 10*3/uL (ref 150–400)
RBC: 4.02 MIL/uL (ref 3.87–5.11)
RDW: 15.8 % — AB (ref 11.5–15.5)
WBC: 6.6 10*3/uL (ref 4.0–10.5)

## 2018-03-26 LAB — CMP (CANCER CENTER ONLY)
ALBUMIN: 2.6 g/dL — AB (ref 3.5–5.0)
ALK PHOS: 54 U/L (ref 38–126)
ALT: 14 U/L (ref 0–44)
ANION GAP: 9 (ref 5–15)
AST: 13 U/L — ABNORMAL LOW (ref 15–41)
BUN: 7 mg/dL (ref 6–20)
CALCIUM: 9 mg/dL (ref 8.9–10.3)
CO2: 23 mmol/L (ref 22–32)
Chloride: 105 mmol/L (ref 98–111)
Creatinine: 0.77 mg/dL (ref 0.44–1.00)
GFR, Est AFR Am: >60 mL/min (ref >60)
GFR, Estimated: >60 mL/min (ref >60)
Glucose, Bld: 95 mg/dL (ref 70–99)
Potassium: 4 mmol/L (ref 3.5–5.1)
SODIUM: 137 mmol/L (ref 135–145)
TOTAL PROTEIN: 7.1 g/dL (ref 6.5–8.1)
Total Bilirubin: 0.3 mg/dL (ref 0.3–1.2)

## 2018-03-26 NOTE — Progress Notes (Signed)
Hematology and Oncology Follow Up Visit  JAYLINA RAMDASS [id number] 1981/03/02 37 y.o. 03/26/2018   Principle Diagnosis:  Second trimester of pregnancy - due date 08/11/2018 Protein S deficiency History of PE History of ectopic pregnancy  Current Therapy:   Lovenox 80 mg SQ daily   Interim History:  Ms. Mckain is here today for follow-up. She just found out she is having another precious little girl. She is doing well on Lovenox and has had no issues with bleeding, bruising or petechiae.  She has some fatigue at times and attributes this staying busy with her girls and work.  No fever, chills, n/v, cough, rash, dizziness, SOB, chest pain, palpitations, abdominal pain or changes in bowel or bladder habits.  No swelling, tenderness, numbness or tingling in her extremities. No c/o pain.  No lymphadenopathy noted on exam.  She has maintained a good appetite and is staying well hydrated. Her weight is stable.   ECOG Performance Status: 0 - Asymptomatic  Medications:  Allergies as of 03/26/2018   No Known Allergies     Medication List        Accurate as of 03/26/18 11:14 AM. Always use your most recent med list.          enoxaparin 80 MG/0.8ML injection Commonly known as:  LOVENOX Inject 0.8 mLs (80 mg total) into the skin daily.   pantoprazole 40 MG tablet Commonly known as:  PROTONIX   prenatal multivitamin Tabs tablet Take 1 tablet by mouth daily at 12 noon.       Allergies: No Known Allergies  Past Medical History, Surgical history, Social history, and Family History were reviewed and updated.  Review of Systems: All other 10 point review of systems is negative.   Physical Exam:  weight is 378 lb 8 oz (171.7 kg) (abnormal). Her oral temperature is 98.1 F (36.7 C). Her blood pressure is 152/99 (abnormal) and her pulse is 89. Her respiration is 20 and oxygen saturation is 100%.   Wt Readings from Last 3 Encounters:  03/26/18 (!) 378 lb 8 oz (171.7 kg)    03/09/18 (!) 372 lb (168.7 kg)  01/24/18 (!) 377 lb (171 kg)    Ocular: Sclerae unicteric, pupils equal, round and reactive to light Ear-nose-throat: Oropharynx clear, dentition fair Lymphatic: No cervical, supraclavicular or axillary adenopathy Lungs no rales or rhonchi, good excursion bilaterally Heart regular rate and rhythm, no murmur appreciated Abd soft, nontender, positive bowel sounds, no liver or spleen tip palpated on exam, no fluid wave  MSK no focal spinal tenderness, no joint edema Neuro: non-focal, well-oriented, appropriate affect Breasts: Deferred   Lab Results  Component Value Date   WBC 6.6 03/26/2018   HGB 10.1 (L) 03/26/2018   HCT 33.5 (L) 03/26/2018   MCV 83.3 03/26/2018   PLT 266 03/26/2018   No results found for: FERRITIN, IRON, TIBC, UIBC, IRONPCTSAT Lab Results  Component Value Date   RBC 4.02 03/26/2018   No results found for: KPAFRELGTCHN, LAMBDASER, KAPLAMBRATIO No results found for: IGGSERUM, IGA, IGMSERUM No results found for: Dorene Ar, A1GS, A2GS, Karn Pickler, SPEI   Chemistry      Component Value Date/Time   NA 138 01/24/2018 1012   NA 141 11/22/2017 0900   K 3.7 01/24/2018 1012   CL 106 01/24/2018 1012   CO2 23 01/24/2018 1012   BUN 9 01/24/2018 1012   BUN 11 11/22/2017 0900   CREATININE 0.74 01/24/2018 1012   CREATININE 0.82 09/03/2012 0956  GLU 102 11/22/2017 0900      Component Value Date/Time   CALCIUM 9.4 01/24/2018 1012   ALKPHOS 55 01/24/2018 1012   AST 14 (L) 01/24/2018 1012   ALT 12 01/24/2018 1012   BILITOT 0.5 01/24/2018 1012      Impression and Plan: Ms. Stavola is a very pleasant 37 yo African American female with protein S deficiency and history of both PE and ectopic pregnancy.  She is now in her second trimester and doing well with Lovenox.  She will continue her same regimen and we will plan to see her back in early January for MD follow-up.  Once we get closer to her due date  she will transition to Heparin.  She will contact our office with any questions or concerns. We can certainly see her sooner if need be.   Emeline Gins, NP 10/15/201911:14 AM

## 2018-05-24 MED FILL — ENOXAPARIN 80 MG/0.8 ML SYR: 80 | 30 days supply | Qty: 24 | Fill #0

## 2018-05-24 MED FILL — PANTOPRAZOLE SOD DR 40 MG T: 40 | 30 days supply | Qty: 30 | Fill #1

## 2018-06-12 NOTE — L&D Delivery Note (Signed)
Delivery Note At 4:21 AM a viable and healthy female was delivered via Vaginal, Spontaneous (Presentation: vtx;  OA).  APGAR: 8, 9; weight pending .   Placenta status: spontaneous intact not sent , .  Cord: none with the following complications: .  Cord pH: n/a  Anesthesia:  epidural Episiotomy: None Lacerations: skid mark Suture Repair: 3.0 chromic Est. Blood Loss (mL):    Mom to postpartum.  Baby to Couplet care / Skin to Skin.  Yvonne Ali A Carolanne Mercier 07/22/2018, 4:35 AM

## 2018-06-25 ENCOUNTER — Encounter (HOSPITAL_COMMUNITY): Payer: Self-pay | Admitting: Certified Registered Nurse Anesthetist

## 2018-06-25 ENCOUNTER — Inpatient Hospital Stay (HOSPITAL_COMMUNITY)
Admission: AD | Admit: 2018-06-25 | Discharge: 2018-06-28 | DRG: 833 | Disposition: A | Discharge status: Home / Self Care | Payer: BLUE CROSS/BLUE SHIELD | Attending: Obstetrics and Gynecology | Admitting: Obstetrics and Gynecology

## 2018-06-25 ENCOUNTER — Other Ambulatory Visit: Payer: Self-pay

## 2018-06-25 ENCOUNTER — Inpatient Hospital Stay (HOSPITAL_BASED_OUTPATIENT_CLINIC_OR_DEPARTMENT_OTHER): Payer: BLUE CROSS/BLUE SHIELD

## 2018-06-25 ENCOUNTER — Inpatient Hospital Stay: Payer: BLUE CROSS/BLUE SHIELD

## 2018-06-25 ENCOUNTER — Inpatient Hospital Stay (HOSPITAL_BASED_OUTPATIENT_CLINIC_OR_DEPARTMENT_OTHER): Payer: BLUE CROSS/BLUE SHIELD | Admitting: Family

## 2018-06-25 VITALS — BP 126/87 | HR 86 | Resp 19 | Wt 383.2 lb

## 2018-06-25 DIAGNOSIS — Z363 Encounter for antenatal screening for malformations: Secondary | ICD-10-CM

## 2018-06-25 DIAGNOSIS — Z3A33 33 weeks gestation of pregnancy: Secondary | ICD-10-CM

## 2018-06-25 DIAGNOSIS — O10013 Pre-existing essential hypertension complicating pregnancy, third trimester: Secondary | ICD-10-CM | POA: Diagnosis present

## 2018-06-25 DIAGNOSIS — O119 Pre-existing hypertension with pre-eclampsia, unspecified trimester: Secondary | ICD-10-CM | POA: Diagnosis not present

## 2018-06-25 DIAGNOSIS — O99213 Obesity complicating pregnancy, third trimester: Secondary | ICD-10-CM | POA: Diagnosis present

## 2018-06-25 DIAGNOSIS — Z87891 Personal history of nicotine dependence: Secondary | ICD-10-CM

## 2018-06-25 DIAGNOSIS — O113 Pre-existing hypertension with pre-eclampsia, third trimester: Principal | ICD-10-CM | POA: Diagnosis present

## 2018-06-25 DIAGNOSIS — Z86711 Personal history of pulmonary embolism: Secondary | ICD-10-CM

## 2018-06-25 DIAGNOSIS — O99843 Bariatric surgery status complicating pregnancy, third trimester: Secondary | ICD-10-CM | POA: Diagnosis present

## 2018-06-25 DIAGNOSIS — D6859 Other primary thrombophilia: Secondary | ICD-10-CM

## 2018-06-25 LAB — CBC WITH DIFFERENTIAL/PLATELET
BASOS ABS: 0 10*3/uL (ref 0.0–0.1)
BASOS PCT: 0 %
Eosinophils Absolute: 0.1 10*3/uL (ref 0.0–0.5)
Eosinophils Relative: 1 %
HCT: 32 % — ABNORMAL LOW (ref 36.0–46.0)
Hemoglobin: 10 g/dL — ABNORMAL LOW (ref 12.0–15.0)
Lymphocytes Relative: 22 %
Lymphs Abs: 2.2 10*3/uL (ref 0.7–4.0)
MCH: 25.2 pg — ABNORMAL LOW (ref 26.0–34.0)
MCHC: 31.3 g/dL (ref 30.0–36.0)
MCV: 80.6 fL (ref 80.0–100.0)
Monocytes Absolute: 0.5 10*3/uL (ref 0.1–1.0)
Monocytes Relative: 5 %
Neutro Abs: 7 10*3/uL (ref 1.7–7.7)
Neutrophils Relative %: 72 %
Platelets: 331 10*3/uL (ref 150–400)
RBC: 3.97 MIL/uL (ref 3.87–5.11)
RDW: 15.4 % (ref 11.5–15.5)
WBC: 9.7 10*3/uL (ref 4.0–10.5)
nRBC: 0 % (ref 0.0–0.2)

## 2018-06-25 LAB — URINALYSIS, ROUTINE W REFLEX MICROSCOPIC
Bacteria, UA: NONE SEEN
Bilirubin Urine: NEGATIVE
Glucose, UA: NEGATIVE mg/dL
Hgb urine dipstick: NEGATIVE
Ketones, ur: 5 mg/dL — AB
Leukocytes, UA: NEGATIVE
Nitrite: NEGATIVE
Protein, ur: 30 mg/dL — AB
Specific Gravity, Urine: 1.023 (ref 1.005–1.030)
pH: 5 (ref 5.0–8.0)

## 2018-06-25 LAB — CBC WITH DIFFERENTIAL (CANCER CENTER ONLY)
Abs Immature Granulocytes: 0.07 10*3/uL (ref 0.00–0.07)
Basophils Absolute: 0 10*3/uL (ref 0.0–0.1)
Basophils Relative: 0 %
Eosinophils Absolute: 0.1 10*3/uL (ref 0.0–0.5)
Eosinophils Relative: 1 %
HCT: 31 % — ABNORMAL LOW (ref 36.0–46.0)
Hemoglobin: 9.5 g/dL — ABNORMAL LOW (ref 12.0–15.0)
IMMATURE GRANULOCYTES: 1 %
Lymphocytes Relative: 19 %
Lymphs Abs: 1.7 10*3/uL (ref 0.7–4.0)
MCH: 24.9 pg — ABNORMAL LOW (ref 26.0–34.0)
MCHC: 30.6 g/dL (ref 30.0–36.0)
MCV: 81.2 fL (ref 80.0–100.0)
MONOS PCT: 8 %
Monocytes Absolute: 0.8 10*3/uL (ref 0.1–1.0)
Neutro Abs: 6.5 10*3/uL (ref 1.7–7.7)
Neutrophils Relative %: 71 %
Platelet Count: 289 10*3/uL (ref 150–400)
RBC: 3.82 MIL/uL — ABNORMAL LOW (ref 3.87–5.11)
RDW: 15.4 % (ref 11.5–15.5)
WBC Count: 9.1 10*3/uL (ref 4.0–10.5)
nRBC: 0 % (ref 0.0–0.2)

## 2018-06-25 LAB — CMP (CANCER CENTER ONLY)
ALBUMIN: 3.2 g/dL — AB (ref 3.5–5.0)
ALT: 56 U/L — ABNORMAL HIGH (ref 0–44)
ANION GAP: 7 (ref 5–15)
AST: 75 U/L — AB (ref 15–41)
Alkaline Phosphatase: 93 U/L (ref 38–126)
BILIRUBIN TOTAL: 0.5 mg/dL (ref 0.3–1.2)
BUN: 7 mg/dL (ref 6–20)
CHLORIDE: 104 mmol/L (ref 98–111)
CO2: 24 mmol/L (ref 22–32)
Calcium: 8.7 mg/dL — ABNORMAL LOW (ref 8.9–10.3)
Creatinine: 0.79 mg/dL (ref 0.44–1.00)
GFR, Est AFR Am: >60 mL/min (ref >60)
GFR, Estimated: >60 mL/min (ref >60)
GLUCOSE: 98 mg/dL (ref 70–99)
POTASSIUM: 4 mmol/L (ref 3.5–5.1)
Sodium: 135 mmol/L (ref 135–145)
TOTAL PROTEIN: 6.8 g/dL (ref 6.5–8.1)

## 2018-06-25 LAB — COMPREHENSIVE METABOLIC PANEL
ALT: 58 U/L — ABNORMAL HIGH (ref 0–44)
AST: 67 U/L — ABNORMAL HIGH (ref 15–41)
Albumin: 2.7 g/dL — ABNORMAL LOW (ref 3.5–5.0)
Alkaline Phosphatase: 97 U/L (ref 38–126)
Anion gap: 8 (ref 5–15)
BUN: 10 mg/dL (ref 6–20)
CALCIUM: 8.7 mg/dL — AB (ref 8.9–10.3)
CO2: 21 mmol/L — AB (ref 22–32)
Chloride: 105 mmol/L (ref 98–111)
Creatinine, Ser: 0.84 mg/dL (ref 0.44–1.00)
GFR calc Af Amer: >60 mL/min (ref >60)
GFR calc non Af Amer: >60 mL/min (ref >60)
Glucose, Bld: 89 mg/dL (ref 70–99)
Potassium: 3.9 mmol/L (ref 3.5–5.1)
Sodium: 134 mmol/L — ABNORMAL LOW (ref 135–145)
Total Bilirubin: 0.7 mg/dL (ref 0.3–1.2)
Total Protein: 6.9 g/dL (ref 6.5–8.1)

## 2018-06-25 LAB — PROTEIN / CREATININE RATIO, URINE
Creatinine, Urine: 237 mg/dL
Protein Creatinine Ratio: 0.15 mg/mg{Cre} (ref 0.00–0.15)
Total Protein, Urine: 35 mg/dL

## 2018-06-25 LAB — TYPE AND SCREEN
ABO/RH(D): B POS
Antibody Screen: NEGATIVE

## 2018-06-25 LAB — URIC ACID: Uric Acid, Serum: 5.7 mg/dL (ref 2.5–7.1)

## 2018-06-25 LAB — MAGNESIUM: MAGNESIUM: 3.4 mg/dL — AB (ref 1.7–2.4)

## 2018-06-25 MED ORDER — MAGNESIUM SULFATE BOLUS VIA INFUSION
4.0000 g | Freq: Once | INTRAVENOUS | Status: AC
  Administered 2018-06-25: 4 g via INTRAVENOUS
  Filled 2018-06-25: qty 500

## 2018-06-25 MED ORDER — LABETALOL HCL 5 MG/ML IV SOLN: 40.0000 mg | INTRAVENOUS | Status: DC | PRN

## 2018-06-25 MED ORDER — HYDRALAZINE HCL 20 MG/ML IJ SOLN: 10.0000 mg | INTRAMUSCULAR | Status: DC | PRN

## 2018-06-25 MED ORDER — LABETALOL HCL 5 MG/ML IV SOLN: 80.0000 mg | INTRAVENOUS | Status: DC | PRN

## 2018-06-25 MED ORDER — LABETALOL HCL 5 MG/ML IV SOLN
20.0000 mg | INTRAVENOUS | Status: DC | PRN
  Administered 2018-06-25: 20 mg via INTRAVENOUS
  Filled 2018-06-25 (×2): qty 4

## 2018-06-25 MED ORDER — LACTATED RINGERS IV SOLN: INTRAVENOUS | Status: DC

## 2018-06-25 MED ORDER — MAGNESIUM SULFATE 40 G IN LACTATED RINGERS - SIMPLE
2.0000 g/h | INTRAVENOUS | Status: AC
  Administered 2018-06-25 – 2018-06-26 (×2): 2 g/h via INTRAVENOUS
  Filled 2018-06-25 (×2): qty 500

## 2018-06-25 MED ORDER — LABETALOL HCL 100 MG PO TABS
100.0000 mg | ORAL_TABLET | Freq: Two times a day (BID) | ORAL | Status: DC
  Administered 2018-06-26 (×2): 100 mg via ORAL
  Filled 2018-06-25 (×2): qty 1

## 2018-06-25 MED ORDER — BETAMETHASONE SOD PHOS & ACET 6 (3-3) MG/ML IJ SUSP
12.0000 mg | INTRAMUSCULAR | Status: AC
  Administered 2018-06-25 – 2018-06-26 (×2): 12 mg via INTRAMUSCULAR
  Filled 2018-06-25 (×3): qty 2

## 2018-06-25 MED ORDER — LACTATED RINGERS IV SOLN
INTRAVENOUS | Status: DC
  Administered 2018-06-25 – 2018-06-26 (×3): via INTRAVENOUS

## 2018-06-25 MED ORDER — ZOLPIDEM TARTRATE 5 MG PO TABS
5.0000 mg | ORAL_TABLET | Freq: Every day | ORAL | Status: DC
  Administered 2018-06-25 – 2018-06-27 (×3): 5 mg via ORAL
  Filled 2018-06-25 (×3): qty 1

## 2018-06-25 NOTE — Progress Notes (Signed)
Results for MIKAIA, ALMARAZ (MRN [id number]) as of 06/25/2018 17:43  Ref. Range 06/25/2018 16:32  Uric Acid, Serum Latest Ref Range: 2.5 - 7.1 mg/dL 5.7  AST Latest Ref Range: 15 - 41 U/L 67 (H)  ALT Latest Ref Range: 0 - 44 U/L 58 (H)   Dr. Cherly Hensen aware of above labs and BP of 164/98. No new orders given. Carmelina Dane, RN

## 2018-06-25 NOTE — Anesthesia Postprocedure Evaluation (Signed)
Anesthesia Post Note  Patient: Yvonne Ali  Procedure(s) Performed: AN AD HOC LINE PLACEMENT     Anesthesia Post Evaluation  Last Vitals:  Vitals:   06/25/18 1810 06/25/18 1839  BP: (!) 158/111 (!) 140/92  Pulse:  (!) 101  Resp:    Temp:    SpO2:      Last Pain:  Vitals:   06/25/18 1630  TempSrc:   PainSc: 0-No pain   Pain Goal:                @ANFLOW60MIN (12500)  )[name] N

## 2018-06-25 NOTE — H&P (Signed)
Yvonne Ali is a 38 y.o. female presenting @ 33 2/7 with elevated LFT noted at her heme/onc appt today. Pt also had elevated BP. Known chronic HTn on no med. Denies RUQ pain, h/a, visual changes or epigastric pain. (+) leg swelling. Hx notable for morbid obesity, gastric sleeve and hx PE for which pt is anticoagulated using lovenox. Last dose was yesterday pm  OB History    Gravida  3   Para  2   Term  2   Preterm      AB  1   Living  2     SAB      TAB      Ectopic  1   Multiple  0   Live Births  2          Past Medical History:  Diagnosis Date  . Acute blood loss anemia 02/06/2015  . Ectopic pregnancy   . Gestational hypertension, antepartum 02/03/2015  . Iron deficiency anemia of pregnancy 02/06/2015  . PE (pulmonary embolism)   . Postpartum care following vaginal delivery (2/2) 07/15/2013  . Pregnancy induced hypertension    Past Surgical History:  Procedure Laterality Date  . BARIATRIC SURGERY     gastric sleeve  . HIP SURGERY     Family History: family history includes Cancer (age of onset: 18) in her paternal grandmother; Diabetes in her paternal grandmother; Hyperlipidemia in her maternal grandfather; Hypertension in her maternal grandfather. Social History:  reports that she quit smoking about 12 years ago. She has never used smokeless tobacco. She reports current alcohol use. She reports that she does not use drugs.     Maternal Diabetes: No Genetic Screening: Normal Maternal Ultrasounds/Referrals: Normal Fetal Ultrasounds or other Referrals:  None Maternal Substance Abuse:  No Significant Maternal Medications:  Meds include: Other: lovenox Significant Maternal Lab Results:  None Other Comments:  chronic HTN no med. hx gastric sleeve, morbid obesity. hx PE  Review of Systems  Eyes: Negative for blurred vision.  Cardiovascular: Positive for leg swelling.  Gastrointestinal: Negative for heartburn, nausea and vomiting.  Neurological: Negative for  headaches.  All other systems reviewed and are negative.  Maternal Medical History:  Reason for admission: Nausea.      Blood pressure (!) 158/111, pulse (!) 103, temperature 98.2 F (36.8 C), temperature source Oral, resp. rate 20, SpO2 100 %, unknown if currently breastfeeding. Exam Physical Exam  Constitutional: She is oriented to person, place, and time. She appears well-developed and well-nourished.  Morbidly obese  Eyes: EOM are normal.  Neck: Neck supple.  Cardiovascular: Regular rhythm.  Respiratory: Breath sounds normal.  GI: Soft.  Musculoskeletal:        General: Edema present.  Neurological: She is alert and oriented to person, place, and time. She has normal reflexes.  Skin: Skin is warm and dry.  Psychiatric: She has a normal mood and affect.    Prenatal labs: ABO, Rh: --/--/B POS (01/14 1632) Antibody: NEG (01/14 1632) Rubella:  Immune RPR:   NR HBsAg:   neg HIV:   neg  GBS:   pendng  Assessment/Plan: Chronic HTN with superimposed preeclampsia Morbid obesity Hx PE  IUP@ 33 2/7 weeks  P) admit . Repeat PIH labs. Institute antihypertensive protocol. GBS cx. BMZ. Start magnesium sulfate. Strict I&O. SCD hose. Hold lovenox. NICU consult. Sono for EFW. Start oral labetalol. Cont fetal monitoring  Ai Sonnenfeld A Kearie Mennen 06/25/2018, 6:17 PM

## 2018-06-25 NOTE — Progress Notes (Signed)
Hematology and Oncology Follow Up Visit  Yvonne Ali [id number] 07-17-1980 38 y.o. 06/25/2018   Principle Diagnosis:  Second trimester of pregnancy - due date 08/11/2018 Protein S deficiency History of PE History of ectopic pregnancy  Current Therapy:   Lovenox 80 mg SQ daily   Interim History:  Yvonne Ali is here today for follow-up. She is now in her 3rd trimester and due on March 1st. She had her last daughter at 65 weeks.  She has had no cramping or abdominal pain.  Her BP when she first arrived was quite high at 166/94, HR 88. After sitting for 30 minutes her recheck was 126/87.  Her LFT's are also quite elevated since her last visit in October. AST is up to 75 from 13 and ALT is 56 from 14. Alk/phas is 93. Total Bili 0.5.  I was able to call and speak with Kenney Houseman the triage nurse for DR. Cousins (OB/GYN) office and let her know about the elevated LFTs' and HTN. I forwarded all of today's labs as well.  She is asymptomatic at this time. No fever, chills, n/v, cough, rash, dizziness, blurred vision, headaches, SOB, chest pain or changes in bowel or bladder habits.  No episodes of bleeding, no bruising or petechiae.  No swelling, tenderness, numbness or tingling in her extremities at this time.  No lymphadenopathy noted on exam.  She has maintained a good appetite and is staying well hydrated. Her weight is stable.   ECOG Performance Status: 1 - Symptomatic but completely ambulatory  Medications:  Allergies as of 06/25/2018   No Known Allergies     Medication List       Accurate as of June 25, 2018 11:53 AM. Always use your most recent med list.        enoxaparin 80 MG/0.8ML injection Commonly known as:  LOVENOX Inject 0.8 mLs (80 mg total) into the skin daily.   pantoprazole 40 MG tablet Commonly known as:  PROTONIX   prenatal multivitamin Tabs tablet Take 1 tablet by mouth daily at 12 noon.       Allergies: No Known Allergies  Past Medical History,  Surgical history, Social history, and Family History were reviewed and updated.  Review of Systems: All other 10 point review of systems is negative.   Physical Exam:  weight is 383 lb 4 oz (173.8 kg) (abnormal). Her blood pressure is 126/87 and her pulse is 86. Her respiration is 19 and oxygen saturation is 99%.   Wt Readings from Last 3 Encounters:  06/25/18 (!) 383 lb 4 oz (173.8 kg)  03/26/18 (!) 378 lb 8 oz (171.7 kg)  03/09/18 (!) 372 lb (168.7 kg)    Ocular: Sclerae unicteric, pupils equal, round and reactive to light Ear-nose-throat: Oropharynx clear, dentition fair Lymphatic: No cervical, supraclavicular or axillary adenopathy Lungs no rales or rhonchi, good excursion bilaterally Heart regular rate and rhythm, no murmur appreciated Abd soft, nontender, positive bowel sounds, no liver or spleen tip palpated on exam, no fluid wave  MSK no focal spinal tenderness, no joint edema Neuro: non-focal, well-oriented, appropriate affect Breasts: Deferred   Lab Results  Component Value Date   WBC 9.1 06/25/2018   HGB 9.5 (L) 06/25/2018   HCT 31.0 (L) 06/25/2018   MCV 81.2 06/25/2018   PLT 289 06/25/2018   No results found for: FERRITIN, IRON, TIBC, UIBC, IRONPCTSAT Lab Results  Component Value Date   RBC 3.82 (L) 06/25/2018   No results found for: KPAFRELGTCHN, LAMBDASER, KAPLAMBRATIO No  results found for: IGGSERUM, IGA, IGMSERUM No results found for: Odetta Pink, SPEI   Chemistry      Component Value Date/Time   NA 135 06/25/2018 0952   NA 141 11/22/2017 0900   K 4.0 06/25/2018 0952   CL 104 06/25/2018 0952   CO2 24 06/25/2018 0952   BUN 7 06/25/2018 0952   BUN 11 11/22/2017 0900   CREATININE 0.79 06/25/2018 0952   CREATININE 0.82 09/03/2012 0956   GLU 102 11/22/2017 0900      Component Value Date/Time   CALCIUM 8.7 (L) 06/25/2018 0952   ALKPHOS 93 06/25/2018 0952   AST 75 (H) 06/25/2018 0952   ALT 56 (H)  06/25/2018 0952   BILITOT 0.5 06/25/2018 0952       Impression and Plan: Yvonne Ali is a very pleasant 38 yo African American female with protein S deficiency and history of both PE and ectopic pregnancy.  She is now in her third trimester, due on 08/11/2018.  I contacted Dr. Harvie Bridge office and let them know about the elevated LFT's and HTN.  She has since been admitted to [facility] with pre-eclampsia. I have messaged Dr. Garwin Brothers regarding switching her over to Heparin per pharmacy since she may deliver earlier than her due date. She will then transition back to Lovenox for 6 weeks after delivery.  We will plan to see her back in 6-8 weeks.  Ms. Fetters will contact our office with any questions or concerns. We can certainly see her sooner if need be.   Laverna Peace, NP 1/14/202011:53 AM

## 2018-06-25 NOTE — Consult Note (Signed)
Neonatology Consult  Note:  At the request of the patients obstetrician Dr. Garwin Brothers I met with Estevan Oaks who is at 64 2  weeks currently with pregancy complicated by pre-eclampsia.    We reviewed initial delivery room management, including CPAP, [place], and low but certainly possible need for intubation for surfactant administration.  We discussed feeding immaturity and need for full po intake with multiple days of good weight gain and no apnea or bradycardia before discharge.  We reviewed increased risk of jaundice, infection, and temperature instability.   Discussed likely length of stay.  Thank you for allowing [place] to participate in her care.  Please call with questions.  Higinio Roger, DO  Neonatologist  The total length of face-to-face or floor / unit time for this encounter was 20 minutes.  Counseling and / or coordination of care was greater than fifty percent of the time.

## 2018-06-26 ENCOUNTER — Encounter (HOSPITAL_COMMUNITY): Payer: Self-pay

## 2018-06-26 LAB — COMPREHENSIVE METABOLIC PANEL
ALBUMIN: 2.7 g/dL — AB (ref 3.5–5.0)
ALT: 48 U/L — ABNORMAL HIGH (ref 0–44)
AST: 42 U/L — AB (ref 15–41)
Alkaline Phosphatase: 86 U/L (ref 38–126)
Anion gap: 9 (ref 5–15)
BUN: 6 mg/dL (ref 6–20)
CO2: 19 mmol/L — ABNORMAL LOW (ref 22–32)
Calcium: 8.2 mg/dL — ABNORMAL LOW (ref 8.9–10.3)
Chloride: 106 mmol/L (ref 98–111)
Creatinine, Ser: 0.73 mg/dL (ref 0.44–1.00)
GFR calc Af Amer: >60 mL/min (ref >60)
GFR calc non Af Amer: >60 mL/min (ref >60)
Glucose, Bld: 131 mg/dL — ABNORMAL HIGH (ref 70–99)
POTASSIUM: 3.8 mmol/L (ref 3.5–5.1)
Sodium: 134 mmol/L — ABNORMAL LOW (ref 135–145)
Total Bilirubin: 0.9 mg/dL (ref 0.3–1.2)
Total Protein: 7.3 g/dL (ref 6.5–8.1)

## 2018-06-26 LAB — COMPREHENSIVE METABOLIC PANEL WITH GFR
ALT: 42 U/L (ref 0–44)
AST: 38 U/L (ref 15–41)
Albumin: 2.8 g/dL — ABNORMAL LOW (ref 3.5–5.0)
Alkaline Phosphatase: 81 U/L (ref 38–126)
Anion gap: 9 (ref 5–15)
BUN: 9 mg/dL (ref 6–20)
CO2: 17 mmol/L — ABNORMAL LOW (ref 22–32)
Calcium: 8 mg/dL — ABNORMAL LOW (ref 8.9–10.3)
Chloride: 110 mmol/L (ref 98–111)
Creatinine, Ser: 0.74 mg/dL (ref 0.44–1.00)
GFR calc Af Amer: >60 mL/min
GFR calc non Af Amer: >60 mL/min
Glucose, Bld: 125 mg/dL — ABNORMAL HIGH (ref 70–99)
Potassium: 4.1 mmol/L (ref 3.5–5.1)
Sodium: 136 mmol/L (ref 135–145)
Total Bilirubin: 0.4 mg/dL (ref 0.3–1.2)
Total Protein: 7.2 g/dL (ref 6.5–8.1)

## 2018-06-26 LAB — CBC
HCT: 30.8 % — ABNORMAL LOW (ref 36.0–46.0)
HCT: 31.9 % — ABNORMAL LOW (ref 36.0–46.0)
Hemoglobin: 9.6 g/dL — ABNORMAL LOW (ref 12.0–15.0)
Hemoglobin: 10 g/dL — ABNORMAL LOW (ref 12.0–15.0)
MCH: 24.8 pg — ABNORMAL LOW (ref 26.0–34.0)
MCH: 25.2 pg — ABNORMAL LOW (ref 26.0–34.0)
MCHC: 31.2 g/dL (ref 30.0–36.0)
MCHC: 31.3 g/dL (ref 30.0–36.0)
MCV: 79.6 fL — ABNORMAL LOW (ref 80.0–100.0)
MCV: 80.4 fL (ref 80.0–100.0)
Platelets: 308 10*3/uL (ref 150–400)
Platelets: 275 10*3/uL (ref 150–400)
RBC: 3.87 MIL/uL (ref 3.87–5.11)
RBC: 3.97 MIL/uL (ref 3.87–5.11)
RDW: 15.6 % — AB (ref 11.5–15.5)
RDW: 15.7 % — ABNORMAL HIGH (ref 11.5–15.5)
WBC: 9.5 10*3/uL (ref 4.0–10.5)
WBC: 13.5 10*3/uL — ABNORMAL HIGH (ref 4.0–10.5)
nRBC: 0 % (ref 0.0–0.2)
nRBC: 0 % (ref 0.0–0.2)

## 2018-06-26 LAB — URIC ACID
Uric Acid, Serum: 5.8 mg/dL (ref 2.5–7.1)
Uric Acid, Serum: 5.9 mg/dL (ref 2.5–7.1)

## 2018-06-26 MED ORDER — HEPARIN SODIUM (PORCINE) 10000 UNIT/ML IJ SOLN
7500.0000 [IU] | Freq: Three times a day (TID) | INTRAMUSCULAR | Status: DC
  Administered 2018-06-26 – 2018-06-28 (×5): 7500 [IU] via SUBCUTANEOUS
  Filled 2018-06-26 (×3): qty 1

## 2018-06-26 MED ORDER — MAGNESIUM SULFATE BOLUS VIA INFUSION
2.0000 g | Freq: Once | INTRAVENOUS | Status: AC
  Administered 2018-06-26: 2 g via INTRAVENOUS
  Filled 2018-06-26: qty 500

## 2018-06-26 MED ORDER — ACETAMINOPHEN 500 MG PO TABS
1000.0000 mg | ORAL_TABLET | Freq: Four times a day (QID) | ORAL | Status: DC | PRN
  Administered 2018-06-26: 1000 mg via ORAL
  Filled 2018-06-26: qty 2

## 2018-06-26 MED ORDER — MAGNESIUM SULFATE 2 GM/50ML IV SOLN: 2.0000 g | Freq: Once | INTRAVENOUS | Status: DC

## 2018-06-26 NOTE — Progress Notes (Signed)
  ANTICOAGULATION CONSULT NOTE - Initial Consult  Pharmacy Consult for Heparin SQ Indication: VTE prophylaxis in pregnant patient with prior hx of PE and know hx of Protein S deficiency  No Known Allergies  Patient Measurements: Weight: (!) 383 lb (173.7 kg)(standing scale )   Vital Signs: Temp: 98.3 F (36.8 C) (01/15 1944) Temp Source: Oral (01/15 1944) BP: 151/98 (01/15 1944) Pulse Rate: 120 (01/15 1944)  Labs: Recent Labs    06/25/18 1632  06/25/18 1634 06/26/18 0603 06/26/18 1817  HGB  --    < > 10.0* 9.6* 10.0*  HCT  --   --  32.0* 30.8* 31.9*  PLT  --   --  331 308 275  CREATININE 0.84  --   --  0.73 0.74   < > = values in this interval not displayed.    Estimated Creatinine Clearance: 171.4 mL/min (by C-G formula based on SCr of 0.74 mg/dL).   Medical History: Past Medical History:  Diagnosis Date  . Acute blood loss anemia 02/06/2015  . Ectopic pregnancy   . Gestational hypertension, antepartum 02/03/2015  . Iron deficiency anemia of pregnancy 02/06/2015  . PE (pulmonary embolism)   . Postpartum care following vaginal delivery (2/2) 07/15/2013  . Pregnancy induced hypertension     Medications:  Medications Prior to Admission  Medication Sig Dispense Refill Last Dose  . enoxaparin (LOVENOX) 80 MG/0.8ML injection Inject 0.8 mLs (80 mg total) into the skin daily. 30 Syringe 8 06/24/2018 at 2000  . pantoprazole (PROTONIX) 40 MG tablet Take 40 mg by mouth.   10 06/24/2018 at Unknown time  . Prenatal Vit-Fe Fumarate-FA (PRENATAL MULTIVITAMIN) TABS tablet Take 1 tablet by mouth daily at 12 noon.   06/24/2018 at Unknown time   Scheduled:  . labetalol  100 mg Oral BID  . zolpidem  5 mg Oral QHS    Assessment: Pregnant patient in 3rd trimester who was on lovenox 80 mg (~0.5 mg/kg) SQ QD prior to admission per hematology consult.  Lovenox has been on hold due to slight elevation in LFTs and potential need for delivery.  LFTs are decreasing and MD desires to start  patient on SQ unfractionated heparin for DVT prophylaxis.  Goal of Therapy:  Heparin level 0.2-0.3 mid interval Monitor platelets by anticoagulation protocol: Yes   Plan:  Begin heparin 7,500 units SQ Q8hr Will check a heparin level after 3 doses and adjust dose as needed.  Natasha Bence 06/26/2018,7:51 PM

## 2018-06-26 NOTE — Progress Notes (Signed)
HD #2 33 3/7 weeks Pt was seen earlier but the system was down Magnesium sulfate BMZ #1 done S: c/o mild h/a Denies visual changes or epigastric pain  O:  Patient Vitals for the past 24 hrs:  BP Temp Temp src Pulse Resp SpO2 Weight  06/26/18 1900 - - - - 20 - -  06/26/18 1830 (!) 149/95 98.4 F (36.9 C) Oral (!) 105 18 100 % -  06/26/18 1730 (!) 145/68 98.4 F (36.9 C) Oral (!) 111 18 100 % -  06/26/18 1700 - - - - 18 - -  06/26/18 1630 132/74 98.1 F (36.7 C) Oral (!) 118 20 100 % -  06/26/18 1530 133/71 98.1 F (36.7 C) Oral (!) 116 20 97 % -  06/26/18 1500 - - - - 20 - -  06/26/18 1430 125/70 - - (!) 121 20 99 % -  06/26/18 1330 (!) 147/89 98.3 F (36.8 C) Oral (!) 108 20 100 % -  06/26/18 1300 - - - - 20 - -  06/26/18 1230 (!) 145/82 97.8 F (36.6 C) Oral (!) 107 18 100 % -  06/26/18 1148 - 97.8 F (36.6 C) Oral - - - -  06/26/18 1120 138/82 - - (!) 104 - - -  06/26/18 1113 (!) 146/77 - - (!) 109 18 - -  06/26/18 1000 - - - - 18 - -  06/26/18 0900 - - - - 20 - -  06/26/18 0800 - - - - 20 - -  06/26/18 0744 (!) 149/94 97.7 F (36.5 C) Oral (!) 110 20 100 % -  06/26/18 0700 - - - - 18 - -  06/26/18 0600 - - - - 18 - (!) 173.7 kg  06/26/18 0505 - - - - 17 - -  06/26/18 0400 (!) 144/86 98.1 F (36.7 C) Oral (!) 109 18 98 % -  06/26/18 0300 - - - - 18 - -  06/26/18 0200 129/74 - - (!) 114 18 99 % -  06/26/18 0100 (!) 144/78 - - (!) 119 17 98 % -  06/26/18 0000 137/86 98.7 F (37.1 C) Oral 100 18 100 % -  06/25/18 2300 (!) 143/74 - - 99 18 100 % -  06/25/18 2200 (!) 147/89 - - 99 18 100 % -  06/25/18 2120 139/79 - - - - - -  06/25/18 2100 - - - - 18 - -  06/25/18 2000 - - - - 18 100 % -  lungs clear to A Cor RRR  abd obese gravid nontender Pelvic long/closed/pp out of pelvis extr 1+ edema DTR 2 + (-) clonus No calf tenderness CBC    Component Value Date/Time   WBC 13.5 (H) 06/26/2018 1817   RBC 3.97 06/26/2018 1817   HGB 10.0 (L) 06/26/2018 1817   HGB 9.5  (L) 06/25/2018 0952   HGB 11.5 (L) 08/03/2014 1534   HCT 31.9 (L) 06/26/2018 1817   HCT 36.0 08/03/2014 1534   PLT 275 06/26/2018 1817   PLT 289 06/25/2018 0952   PLT 304 08/03/2014 1534   MCV 80.4 06/26/2018 1817   MCV 83 08/03/2014 1534   MCH 25.2 (L) 06/26/2018 1817   MCHC 31.3 06/26/2018 1817   RDW 15.7 (H) 06/26/2018 1817   RDW 15.3 08/03/2014 1534   LYMPHSABS 2.2 06/25/2018 1634   LYMPHSABS 2.2 08/03/2014 1534   MONOABS 0.5 06/25/2018 1634   EOSABS 0.1 06/25/2018 1634   EOSABS 0.1 08/03/2014 1534   BASOSABS  0.0 06/25/2018 1634   BASOSABS 0.0 08/03/2014 1534   CMP Latest Ref Rng & Units 06/26/2018 06/26/2018 06/25/2018  Glucose 70 - 99 mg/dL 366(Q) 947(M) 89  BUN 6 - 20 mg/dL 9 6 10   Creatinine 0.44 - 1.00 mg/dL 5.46 5.03 5.46  Sodium 135 - 145 mmol/L 136 134(L) 134(L)  Potassium 3.5 - 5.1 mmol/L 4.1 3.8 3.9  Chloride 98 - 111 mmol/L 110 106 105  CO2 22 - 32 mmol/L 17(L) 19(L) 21(L)  Calcium 8.9 - 10.3 mg/dL 8.0(L) 8.2(L) 8.7(L)  Total Protein 6.5 - 8.1 g/dL 7.2 7.3 6.9  Total Bilirubin 0.3 - 1.2 mg/dL 0.4 0.9 0.7  Alkaline Phos 38 - 126 U/L 81 86 97  AST 15 - 41 U/L 38 42(H) 67(H)  ALT 0 - 44 U/L 42 48(H) 58(H)  IMP: chronic HTN with superimposed preeclampsia. On magnesium sulfate . BMZ #2 this pm Morbid obesity IDA Hx PE  Off lovenox IUP @ 33 3/7 weeks P) d/c magnesium sulfate after 2nd dose of BMZ. . Repeat PIH labs in pm( off magnesium). Cont oral  labetalol SCD stocking. Resume anticoagulation if labs improved and delivery not imminent. Cont fetal monitoring. Cont inpt

## 2018-06-27 ENCOUNTER — Encounter (HOSPITAL_COMMUNITY): Payer: Self-pay | Admitting: *Deleted

## 2018-06-27 LAB — CBC
HCT: 29.3 % — ABNORMAL LOW (ref 36.0–46.0)
Hemoglobin: 9.2 g/dL — ABNORMAL LOW (ref 12.0–15.0)
MCH: 25.1 pg — AB (ref 26.0–34.0)
MCHC: 31.4 g/dL (ref 30.0–36.0)
MCV: 80.1 fL (ref 80.0–100.0)
NRBC: 0 % (ref 0.0–0.2)
Platelets: 319 10*3/uL (ref 150–400)
RBC: 3.66 MIL/uL — ABNORMAL LOW (ref 3.87–5.11)
RDW: 15.7 % — ABNORMAL HIGH (ref 11.5–15.5)
WBC: 9.8 10*3/uL (ref 4.0–10.5)

## 2018-06-27 LAB — CREATININE CLEARANCE, URINE, 24 HOUR
Collection Interval-CRCL: 24 hours
Creatinine Clearance: 166 mL/min — ABNORMAL HIGH (ref 75–115)
Creatinine, 24H Ur: 1770 mg/d (ref 600–1800)
Creatinine, Urine: 68.06 mg/dL
Urine Total Volume-CRCL: 2600 mL

## 2018-06-27 LAB — COMPREHENSIVE METABOLIC PANEL
ALT: 40 U/L (ref 0–44)
AST: 27 U/L (ref 15–41)
Albumin: 2.6 g/dL — ABNORMAL LOW (ref 3.5–5.0)
Alkaline Phosphatase: 75 U/L (ref 38–126)
Anion gap: 6 (ref 5–15)
BUN: 9 mg/dL (ref 6–20)
CO2: 21 mmol/L — ABNORMAL LOW (ref 22–32)
CREATININE: 0.79 mg/dL (ref 0.44–1.00)
Calcium: 8 mg/dL — ABNORMAL LOW (ref 8.9–10.3)
Chloride: 107 mmol/L (ref 98–111)
GFR calc Af Amer: >60 mL/min (ref >60)
Glucose, Bld: 180 mg/dL — ABNORMAL HIGH (ref 70–99)
Potassium: 3.9 mmol/L (ref 3.5–5.1)
Sodium: 134 mmol/L — ABNORMAL LOW (ref 135–145)
Total Bilirubin: 0.4 mg/dL (ref 0.3–1.2)
Total Protein: 7.1 g/dL (ref 6.5–8.1)

## 2018-06-27 LAB — PROTEIN, URINE, 24 HOUR
Collection Interval-UPROT: 24 hours
Protein, 24H Urine: 390 mg/d — ABNORMAL HIGH (ref 50–100)
Protein, Urine: 15 mg/dL
Urine Total Volume-UPROT: 2600 mL

## 2018-06-27 MED ORDER — LABETALOL HCL 200 MG PO TABS
200.0000 mg | ORAL_TABLET | Freq: Two times a day (BID) | ORAL | Status: DC
  Administered 2018-06-27 – 2018-06-28 (×3): 200 mg via ORAL
  Filled 2018-06-27: qty 1
  Filled 2018-06-27: qty 2
  Filled 2018-06-27: qty 1

## 2018-06-27 NOTE — Progress Notes (Signed)
HD #3 S: denies h/a, visual change or epigastric pain No RUQ pain   O:BP (!) 139/100 (BP Location: Left Wrist)   Pulse 95   Temp 98 F (36.7 C) (Oral)   Resp 16   Ht 5\' 10"  (1.778 m)   Wt (!) 175.7 kg Comment: standing scale   SpO2 100%   BMI 55.59 kg/m  Patient Vitals for the past 24 hrs:  BP Temp Temp src Pulse Resp SpO2 Height Weight  06/27/18 0816 (!) 139/100 98 F (36.7 C) Oral 95 16 100 % - -  06/27/18 0500 - - - - - - - (!) 175.7 kg  06/27/18 0345 (!) 144/94 - - 90 17 100 % - -  06/26/18 2350 127/62 97.7 F (36.5 C) Oral (!) 109 17 99 % 5\' 10"  (1.778 m) (!) 173.7 kg  06/26/18 2300 - - - - 18 - - -  06/26/18 2200 - - - - 18 - - -  06/26/18 2100 - - - - 18 - - -  06/26/18 2000 - - - - 18 - - -  06/26/18 1944 (!) 151/98 98.3 F (36.8 C) Oral (!) 120 17 100 % - -  06/26/18 1915 - - - - 17 - - -  06/26/18 1900 - - - - 20 - - -  06/26/18 1830 (!) 149/95 98.4 F (36.9 C) Oral (!) 105 18 100 % - -  06/26/18 1730 (!) 145/68 98.4 F (36.9 C) Oral (!) 111 18 100 % - -  06/26/18 1700 - - - - 18 - - -  06/26/18 1630 132/74 98.1 F (36.7 C) Oral (!) 118 20 100 % - -  06/26/18 1530 133/71 98.1 F (36.7 C) Oral (!) 116 20 97 % - -  06/26/18 1500 - - - - 20 - - -  06/26/18 1430 125/70 - - (!) 121 20 99 % - -  06/26/18 1330 (!) 147/89 98.3 F (36.8 C) Oral (!) 108 20 100 % - -  06/26/18 1300 - - - - 20 - - -  06/26/18 1230 (!) 145/82 97.8 F (36.6 C) Oral (!) 107 18 100 % - -  06/26/18 1148 - 97.8 F (36.6 C) Oral - - - - -  06/26/18 1120 138/82 - - (!) 104 - - - -  06/26/18 1113 (!) 146/77 - - (!) 109 18 - - -  06/26/18 1000 - - - - 18 - - -  06/26/18 0900 - - - - 20 - - -  lungs clear to A Cor RRR abd soft obese nontender gravid  extr tr edema. DTR 2 +  CBC Latest Ref Rng & Units 06/27/2018 06/26/2018 06/26/2018  WBC 4.0 - 10.5 K/uL 9.8 13.5(H) 9.5  Hemoglobin 12.0 - 15.0 g/dL 1.6(X) 10.0(L) 9.6(L)  Hematocrit 36.0 - 46.0 % 29.3(L) 31.9(L) 30.8(L)  Platelets 150 -  400 K/uL 319 275 308   CMP Latest Ref Rng & Units 06/27/2018 06/26/2018 06/26/2018  Glucose 70 - 99 mg/dL 096(E) 454(U) 981(X)  BUN 6 - 20 mg/dL 9 9 6   Creatinine 0.44 - 1.00 mg/dL 9.14 7.82 9.56  Sodium 135 - 145 mmol/L 134(L) 136 134(L)  Potassium 3.5 - 5.1 mmol/L 3.9 4.1 3.8  Chloride 98 - 111 mmol/L 107 110 106  CO2 22 - 32 mmol/L 21(L) 17(L) 19(L)  Calcium 8.9 - 10.3 mg/dL 8.0(L) 8.0(L) 8.2(L)  Total Protein 6.5 - 8.1 g/dL 7.1 7.2 7.3  Total Bilirubin 0.3 - 1.2 mg/dL  0.4 0.4 0.9  Alkaline Phos 38 - 126 U/L 75 81 86  AST 15 - 41 U/L 27 38 42(H)  ALT 0 - 44 U/L 40 42 48(H)  24 hr UTP 390mg   Tracing baseline 140 (+) accel to 170  no ctx [place] Mfm Ob Detail +14 Wk  Result Date: 06/26/2018 ----------------------------------------------------------------------  OBSTETRICS REPORT                       (Signed Final 06/26/2018 11:18 am) ---------------------------------------------------------------------- Patient Info  ID #:       [id number]                          D.O.B.:  1981/04/18 (37 yrs)  Name:       Yvonne Ali                Visit Date: 06/25/2018 07:59 pm ---------------------------------------------------------------------- Performed By  Performed By:     Marcellina MillinKelly L Moser          Ref. Address:      [facility]                    [facility]                                                              [facility]                                                              [facility]                                                              [address]                                                              [place], [place]                                                              [phone]  Attending:        Lin Landsmanorenthian Booker      Secondary Phy.:    3rd Nursing- HR                    MD  OB                                                              3rd Floor  Referred By:      Nena JordanSHERONETTE             [facility]                    Shravya Wickwire MD ---------------------------------------------------------------------- Orders   #  Description                           Code        Ordered By   1  US MFM OB DETAIL +14 WK               76811.01    Thaddus Mcdowell  ----------------------------------------------------------------------   #  Order #                     Accession #                Episode #   1  [id number]                   [id number]                 [id number]  ---------------------------------------------------------------------- Indications   Hypertension - Chronic with superimposed        O11.9 O10.919   preeclampsia   Encounter for antenatal screening for           Z36.3   malformations   [redacted] weeks gestation of pregnancy                 Z3A.33  ---------------------------------------------------------------------- Fetal Evaluation  Num Of Fetuses:          1  Fetal Heart              162  Rate(bpm):  Cardiac Activity:        Observed  Presentation:            Cephalic  Placenta:                Anterior  P. Cord Insertion:       Not well visualized  Amniotic Fluid  AFI FV:      Within normal limits  AFI Sum(cm)     %Tile       Largest Pocket(cm)  22.36           85          8.13  RUQ(cm)       RLQ(cm)       LUQ(cm)        LLQ(cm)  8.13          6.86          3.29  4.08 ---------------------------------------------------------------------- Biometry  BPD:      83.1  mm     G. Age:  33w 3d         49  %    CI:         70.24  %    70 - 86                                                          FL/HC:       21.4  %    19.9 - 21.5  HC:      316.2  mm     G. Age:  35w 4d         71  %    HC/AC:       1.05       0.96 - 1.11  AC:      300.6  mm     G. Age:  34w 0d         73  %    FL/BPD:      81.5  %    71 - 87  FL:       67.7  mm     G. Age:  34w 5d         78  %    FL/AC:       22.5  %    20 - 24  HUM:      58.2  mm     G. Age:  33w 5d         68  %   Est. FW:    2406   g      5 lb 5 oz     77  %                     m ---------------------------------------------------------------------- Gestational Age  U/S Today:     34w 3d                                        EDD:    08/03/18  Best:          33w 2d     Det. By:  Marcella Dubs         EDD:    08/11/18 ---------------------------------------------------------------------- Anatomy  Cranium:               Appears normal         Aortic Arch:            Not well visualized  Cavum:                 Appears normal         Ductal Arch:            Not well visualized  Ventricles:            Not well visualized    Diaphragm:              Appears normal  Choroid Plexus:        Not well visualized    Stomach:                Appears  normal,                                                                        left sided  Cerebellum:            Appears normal         Abdomen:                Appears normal  Posterior Fossa:       Appears normal         Abdominal Wall:         Not well visualized  Nuchal Fold:           Not applicable (>20    Cord Vessels:           Appears normal ([redacted]                         wks GA)                                        vessel cord)  Face:                  Appears normal         Kidneys:                Appear normal                         (orbits and profile)  Lips:                  Appears normal         Bladder:                Appears normal  Thoracic:              Appears normal         Spine:                  Not well visualized  Heart:                 Appears normal         Upper Extremities:      Visualized                         (4CH, axis, and                         situs  RVOT:                  Appears normal         Lower Extremities:      Visualized  LVOT:                  Appears normal  Other:  Female gender Technically difficult due to advanced gestational age.          Technically difficult due to maternal habitus and fetal position.  ---------------------------------------------------------------------- Doppler - Fetal Vessels  Umbilical Artery  S/D     %tile  2.61        51 ---------------------------------------------------------------------- Cervix Uterus Adnexa  Cervix  Not visualized (advanced GA >24wks) ---------------------------------------------------------------------- Impression  Normal interval growth.  No ultrasonic evidence of structural  fetal anomalies. ---------------------------------------------------------------------- Recommendations  Follow up growth and anatomy in 4 weeks  Continue weekly testing with BPP/NST. ----------------------------------------------------------------------               Lin Landsman, MD Electronically Signed Final Report   06/26/2018 11:18 am ----------------------------------------------------------------------  IMP: chronic HTn with superimposed preeclampsia s/p magnesium sulfate. BMZ complete on labetalol Hx PE started on heparin last night Morbid obesity IUP @ 33 5/7 wee P) cont inpt mgmt given BP today. Labs normalized. Given prematurity will cont with trying to control BP to get more time. Pt advised if unable to control BP and or/ lab return to abnl levels then she will need to be induced. Increase labetalol. May change fetal monitoring

## 2018-06-28 LAB — COMPREHENSIVE METABOLIC PANEL
ALBUMIN: 2.4 g/dL — AB (ref 3.5–5.0)
ALT: 29 U/L (ref 0–44)
AST: 22 U/L (ref 15–41)
Alkaline Phosphatase: 62 U/L (ref 38–126)
Anion gap: 8 (ref 5–15)
BUN: 11 mg/dL (ref 6–20)
CO2: 20 mmol/L — ABNORMAL LOW (ref 22–32)
Calcium: 8.1 mg/dL — ABNORMAL LOW (ref 8.9–10.3)
Chloride: 107 mmol/L (ref 98–111)
Creatinine, Ser: 0.83 mg/dL (ref 0.44–1.00)
GFR calc Af Amer: >60 mL/min (ref >60)
GFR calc non Af Amer: >60 mL/min (ref >60)
GLUCOSE: 112 mg/dL — AB (ref 70–99)
Potassium: 3.4 mmol/L — ABNORMAL LOW (ref 3.5–5.1)
Sodium: 135 mmol/L (ref 135–145)
Total Bilirubin: 0.7 mg/dL (ref 0.3–1.2)
Total Protein: 6.5 g/dL (ref 6.5–8.1)

## 2018-06-28 LAB — CBC
HCT: 28.7 % — ABNORMAL LOW (ref 36.0–46.0)
Hemoglobin: 8.7 g/dL — ABNORMAL LOW (ref 12.0–15.0)
MCH: 24.6 pg — ABNORMAL LOW (ref 26.0–34.0)
MCHC: 30.3 g/dL (ref 30.0–36.0)
MCV: 81.1 fL (ref 80.0–100.0)
Platelets: 293 10*3/uL (ref 150–400)
RBC: 3.54 MIL/uL — ABNORMAL LOW (ref 3.87–5.11)
RDW: 15.8 % — ABNORMAL HIGH (ref 11.5–15.5)
WBC: 10.8 10*3/uL — ABNORMAL HIGH (ref 4.0–10.5)
nRBC: 0 % (ref 0.0–0.2)

## 2018-06-28 LAB — HEPARIN LEVEL (UNFRACTIONATED): Heparin Unfractionated: <0.1 IU/mL — ABNORMAL LOW (ref 0.30–0.70)

## 2018-06-28 MED ORDER — LABETALOL HCL 200 MG PO TABS: 300.0000 mg | ORAL_TABLET | Freq: Two times a day (BID) | ORAL | 9 refills | Status: DC

## 2018-06-28 MED ORDER — HEPARIN SODIUM (PORCINE) 10000 UNIT/ML IJ SOLN
10000.0000 [IU] | INTRAMUSCULAR | Status: AC
  Administered 2018-06-28: 10000 [IU] via SUBCUTANEOUS
  Filled 2018-06-28: qty 1

## 2018-06-28 MED ORDER — SODIUM CHLORIDE 0.9 % IV SOLN
510.0000 mg | Freq: Once | INTRAVENOUS | Status: AC
  Administered 2018-06-28: 510 mg via INTRAVENOUS
  Filled 2018-06-28: qty 17

## 2018-06-28 MED ORDER — HEPARIN SODIUM (PORCINE) 10000 UNIT/ML IJ SOLN: 7500.0000 [IU] | Freq: Three times a day (TID) | INTRAMUSCULAR | 11 refills | Status: DC

## 2018-06-28 MED ORDER — POLYSACCHARIDE IRON COMPLEX 150 MG PO CAPS: 150.0000 mg | ORAL_CAPSULE | Freq: Every day | ORAL | 11 refills | Status: DC

## 2018-06-28 MED FILL — FERREX 150 CAPSULE: 150 | 30 days supply | Qty: 30 | Fill #0

## 2018-06-28 MED FILL — B-D TB SYRINGE 26GX3/8": 26G X 3/8" | 30 days supply | Qty: 90 | Fill #0

## 2018-06-28 MED FILL — HEPARIN SOD 10,000 UNIT/ML: 10000 | 17 days supply | Qty: 50 | Fill #0

## 2018-06-28 MED FILL — B-D TB SYRINGE 26GX3/8: 26G X 3/8" | 30 days supply | Qty: 90 | Fill #0

## 2018-06-28 MED FILL — LABETALOL HCL 200 MG TABS: 200 | 30 days supply | Qty: 60 | Fill #0

## 2018-06-28 NOTE — Progress Notes (Signed)
33 5/7 weeks  Hx PE on heparin BMZ complete Chronic HTn  On labetalol  S: good FM denies ctx . Denies h/a, blurry vision or epigastric pain  O"  Patient Vitals for the past 24 hrs:  BP Temp Temp src Pulse Resp SpO2 Weight  06/28/18 1202 (!) 149/79 97.6 F (36.4 C) Oral 75 18 100 % -  06/28/18 1019 (!) 148/73 - - - - - -  06/28/18 0826 (!) 151/88 98.5 F (36.9 C) Oral 90 18 100 % -  06/28/18 0500 - - - - - - (!) 176.5 kg  06/28/18 0410 124/75 (!) 97.4 F (36.3 C) Oral (!) 116 (!) 22 98 % -  06/27/18 2326 118/66 98.5 F (36.9 C) Oral (!) 113 (!) 21 97 % -  06/27/18 1920 (!) 142/88 97.6 F (36.4 C) Oral (!) 108 20 100 % -  06/27/18 1556 (!) 143/74 97.9 F (36.6 C) Oral 100 18 97 % -   Lungs clear to A Cor RRR Abd: gravid obese Pelvic deferred extr tr edema DTR 2+ (-) clonus Tracing: baseline 140 (+) accel to 160  No ctx    CBC Latest Ref Rng & Units 06/28/2018 06/27/2018 06/26/2018  WBC 4.0 - 10.5 K/uL 10.8(H) 9.8 13.5(H)  Hemoglobin 12.0 - 15.0 g/dL 5.3(Z) 4.8(O) 10.0(L)  Hematocrit 36.0 - 46.0 % 28.7(L) 29.3(L) 31.9(L)  Platelets 150 - 400 K/uL 293 319 275  IMP: chronic HTn with superimposed preeclampsia. Labs normalized after Magnesium sulfate BMZ complete . On labetalol Hx PE on heparin Morbid obesity IUP@ 33 5/7 weeks IDA complicating pregnancy P) IV feraheme prior to d/c . Increase labetalol dosing. D/c home. Continue with weekly antepartum testing. Remain OOW. Restart oral iron therapy . Preeclampsia warning signs. Goal: try to get to 37 weeks if possible. appt 1/21 at office. Cont heparin. F/u with Dr Myna Hidalgo

## 2018-06-28 NOTE — Progress Notes (Signed)
ANTICOAGULATION CONSULT NOTE - Follow Up Consult  Pharmacy Consult for SQ heparin Indication: DVT prophylaxis  No Known Allergies  Patient Measurements: Height: 5\' 10"  (177.8 cm) Weight: (!) 389 lb 0.8 oz (176.5 kg) IBW/kg (Calculated) : 68.5 kg   Vital Signs: Temp: 97.6 F (36.4 C) (01/17 1202) Temp Source: Oral (01/17 1202) BP: 149/79 (01/17 1202) Pulse Rate: 75 (01/17 1202)  Labs: Recent Labs    06/26/18 1817 06/27/18 0650 06/28/18 0835 06/28/18 1318  HGB 10.0* 9.2* 8.7*  --   HCT 31.9* 29.3* 28.7*  --   PLT 275 319 293  --   HEPARINUNFRC  --   --   --  <0.10*  CREATININE 0.74 0.79 0.83  --     Estimated Creatinine Clearance: 166.8 mL/min (by C-G formula based on SCr of 0.83 mg/dL).   Medications:  Pt was on Heparin 7500 units SQ Q 8 hr.  Assessment: Heparin level was drawn ~4 hrs after 5th dose and it was <0.1.  Need to increase dose.  Goal of Therapy:  Mid-interval heparin level 0.2-0.3   Plan:  Discussed with Dr. Cherly Hensen Increase heparin to 10,000 units SQ Q 8 hr.  Give dose now prior to discharge.   Called [facility] outpatient pharmacy to update her outpatient prescription.  Patient aware that prescription is at the [facility] on [facility].  Natasha Bence 06/28/2018,4:18 PM

## 2018-06-28 NOTE — Discharge Summary (Addendum)
Physician Discharge Summary  Patient ID: Yvonne Ali MRN: [id number] DOB/AGE: Nov 06, 1980 38 y.o.  Admit date: 06/25/2018 Discharge date: 06/28/2018  Admission Diagnoses: elevated LFT. Chronic HTN . Morbid obesity, IDA. Hx Pulmonary embolism IUP@ 33 2/7 weeks.   Discharge Diagnoses: chronic HTn with superimposed preeclampsia, morbid obesity, hx pulmonary embolism. IUP@ 33 5/7 weeks, IDA Active Problems:   Preeclampsia complicating hypertension   Chronic hypertension with superimposed preeclampsia   Discharged Condition: stable  Hospital Course: pt was seen at heme/onc on the day of admission and was found to have elevated LFTs. Pt has known hx of chronic HTN for which she has not been on any medication. Pt was placed on magnesium sulfate until BMZ completed. Pt diuresed well. Labs were serially drawn and LFT normalized. IDA resulted in IV iron therapy. Pt was started on labetalol. Pt was switched to heparin in the event of needing to be delivered. Once the labs remains stable, BP was felt to be able to be managed as outpt. The last day, heparin level was not measurable. Therefore her heparin level as increased and will have pt f/u with Dr Myna Hidalgo next week  Consults: pharmacy  Significant Diagnostic Studies: labs:  CBC Latest Ref Rng & Units 06/28/2018 06/27/2018 06/26/2018  WBC 4.0 - 10.5 K/uL 10.8(H) 9.8 13.5(H)  Hemoglobin 12.0 - 15.0 g/dL 4.0(J) 8.1(X) 10.0(L)  Hematocrit 36.0 - 46.0 % 28.7(L) 29.3(L) 31.9(L)  Platelets 150 - 400 K/uL 293 319 275   CMP Latest Ref Rng & Units 06/28/2018 06/27/2018 06/26/2018  Glucose 70 - 99 mg/dL 914(N) 829(F) 621(H)  BUN 6 - 20 mg/dL 11 9 9   Creatinine 0.44 - 1.00 mg/dL 0.86 5.78 4.69  Sodium 135 - 145 mmol/L 135 134(L) 136  Potassium 3.5 - 5.1 mmol/L 3.4(L) 3.9 4.1  Chloride 98 - 111 mmol/L 107 107 110  CO2 22 - 32 mmol/L 20(L) 21(L) 17(L)  Calcium 8.9 - 10.3 mg/dL 8.1(L) 8.0(L) 8.0(L)  Total Protein 6.5 - 8.1 g/dL 6.5 7.1 7.2  Total Bilirubin  0.3 - 1.2 mg/dL 0.7 0.4 0.4  Alkaline Phos 38 - 126 U/L 62 75 81  AST 15 - 41 U/L 22 27 38  ALT 0 - 44 U/L 29 40 42  24hr UTP collection> 24 hr UTP 390mg   Urine protein/creatinine ratio 0.15 Ultrasound:  [place] Mfm Ob Detail +14 Wk  Result Date: 06/26/2018 ----------------------------------------------------------------------  OBSTETRICS REPORT                       (Signed Final 06/26/2018 11:18 am) ---------------------------------------------------------------------- Patient Info  ID #:       [id number]                          D.O.B.:  1981-04-23 (37 yrs)  Name:       Yvonne Ali                Visit Date: 06/25/2018 07:59 pm ---------------------------------------------------------------------- Performed By  Performed By:     Marcellina Millin          Ref. Address:      [employer]                    [facility]                                                              [facility]                                                              [address]                                                              [place], [place]                                                              [address]  Attending:        Lin Landsman      Secondary Phy.:    3rd Nursing- HR                    MD                                                              OB                                                              3rd Floor  Referred By:      Nena Jordan             Location:          [facility]                    Jerson Furukawa MD ---------------------------------------------------------------------- Orders   #  Description                           Code        Ordered By   1  [place] MFM OB DETAIL +14 WK               76811.01    [facility]  Claudean Leavelle  ----------------------------------------------------------------------   #  Order #                     Accession #                Episode  #   1  [id number]                   [id number]                 [id number]  ---------------------------------------------------------------------- Indications   Hypertension - Chronic with superimposed        O11.9 O10.919   preeclampsia   Encounter for antenatal screening for           Z36.3   malformations   [redacted] weeks gestation of pregnancy                 Z3A.33  ---------------------------------------------------------------------- Fetal Evaluation  Num Of Fetuses:          1  Fetal Heart              162  Rate(bpm):  Cardiac Activity:        Observed  Presentation:            Cephalic  Placenta:                Anterior  P. Cord Insertion:       Not well visualized  Amniotic Fluid  AFI FV:      Within normal limits  AFI Sum(cm)     %Tile       Largest Pocket(cm)  22.36           85          8.13  RUQ(cm)       RLQ(cm)       LUQ(cm)        LLQ(cm)  8.13          6.86          3.29           4.08 ---------------------------------------------------------------------- Biometry  BPD:      83.1  mm     G. Age:  33w 3d         49  %    CI:         70.24  %    70 - 86                                                          FL/HC:       21.4  %    19.9 - 21.5  HC:      316.2  mm     G. Age:  35w 4d         71  %    HC/AC:       1.05       0.96 - 1.11  AC:      300.6  mm     G. Age:  34w 0d         73  %    FL/BPD:      81.5  %    71 - 87  FL:       67.7  mm     G.  Age:  34w 5d         78  %    FL/AC:       22.5  %    20 - 24  HUM:      58.2  mm     G. Age:  33w 5d         68  %  Est. FW:    2406   g      5 lb 5 oz     77  %                     m ---------------------------------------------------------------------- Gestational Age  U/S Today:     34w 3d                                        EDD:    08/03/18  Best:          33w 2d     Det. By:  Marcella Dubs         EDD:    08/11/18 ---------------------------------------------------------------------- Anatomy  Cranium:               Appears normal         Aortic Arch:             Not well visualized  Cavum:                 Appears normal         Ductal Arch:            Not well visualized  Ventricles:            Not well visualized    Diaphragm:              Appears normal  Choroid Plexus:        Not well visualized    Stomach:                Appears normal,                                                                        left sided  Cerebellum:            Appears normal         Abdomen:                Appears normal  Posterior Fossa:       Appears normal         Abdominal Wall:         Not well visualized  Nuchal Fold:           Not applicable (>20    Cord Vessels:           Appears normal ([redacted]                         wks GA)  vessel cord)  Face:                  Appears normal         Kidneys:                Appear normal                         (orbits and profile)  Lips:                  Appears normal         Bladder:                Appears normal  Thoracic:              Appears normal         Spine:                  Not well visualized  Heart:                 Appears normal         Upper Extremities:      Visualized                         (4CH, axis, and                         situs  RVOT:                  Appears normal         Lower Extremities:      Visualized  LVOT:                  Appears normal  Other:  Female gender Technically difficult due to advanced gestational age.          Technically difficult due to maternal habitus and fetal position. ---------------------------------------------------------------------- Doppler - Fetal Vessels  Umbilical Artery   S/D     %tile  2.61        51 ---------------------------------------------------------------------- Cervix Uterus Adnexa  Cervix  Not visualized (advanced GA >24wks) ---------------------------------------------------------------------- Impression  Normal interval growth.  No ultrasonic evidence of structural  fetal anomalies.  ---------------------------------------------------------------------- Recommendations  Follow up growth and anatomy in 4 weeks  Continue weekly testing with BPP/NST. ----------------------------------------------------------------------               Lin Landsmanorenthian Booker, MD Electronically Signed Final Report   06/26/2018 11:18 am ----------------------------------------------------------------------  Treatments: anticoagulation: heparin and IV magnesium sulfate, labetalol. BMZ  Discharge Exam: Blood pressure (!) 149/79, pulse 75, temperature 97.6 F (36.4 C), temperature source Oral, resp. rate 18, height 5\' 10"  (1.778 m), weight (!) 176.5 kg, SpO2 100 %, unknown if currently breastfeeding. General appearance: alert, cooperative and no distress Resp: clear to auscultation bilaterally Breasts: normal appearance, no masses or tenderness Cardio: regular rate and rhythm, S1, S2 normal, no murmur, click, rub or gallop GI: soft obese gravid Pelvic: deferred Extremities: edema tr. no calf tenderness  Disposition: Discharge disposition: 01-Home or Self Care       Discharge Instructions    Bed rest   Complete by:  As directed    Diet - low sodium heart healthy   Complete by:  As directed    Discharge instructions   Complete by:  As directed    Preeclampsia warning signs  Allergies as of 06/28/2018   No Known Allergies     Medication List    STOP taking these medications   enoxaparin 80 MG/0.8ML injection Commonly known as:  LOVENOX     TAKE these medications   heparin 38756 UNIT/ML injection Inject 0.75 mLs (7,500 Units total) into the skin every 8 (eight) hours.   iron polysaccharides 150 MG capsule Commonly known as:  NU-IRON Take 1 capsule (150 mg total) by mouth daily.   labetalol 200 MG tablet Commonly known as:  NORMODYNE Take 1.5 tablets (300 mg total) by mouth 2 (two) times daily.   pantoprazole 40 MG tablet Commonly known as:  PROTONIX Take 40 mg by mouth.    prenatal multivitamin Tabs tablet Take 1 tablet by mouth daily at 12 noon.      Follow-up Information    Maxie Better, MD Follow up on 07/02/2018.   Specialty:  Obstetrics and Gynecology Contact information: [address] [place] [address] [phone]         f/u Dr Myna Hidalgo next week  Signed: Nena Jordan A Dylyn Mclaren 06/28/2018, 1:42 PM

## 2018-06-28 NOTE — Progress Notes (Signed)
Pt d/c   Home   With husband    Ambulated out

## 2018-07-10 ENCOUNTER — Telehealth (HOSPITAL_COMMUNITY): Payer: Self-pay | Admitting: *Deleted

## 2018-07-10 NOTE — Telephone Encounter (Signed)
Preadmission screen  

## 2018-07-12 ENCOUNTER — Telehealth (HOSPITAL_COMMUNITY): Payer: Self-pay | Admitting: *Deleted

## 2018-07-12 ENCOUNTER — Encounter (HOSPITAL_COMMUNITY): Payer: Self-pay | Admitting: *Deleted

## 2018-07-12 NOTE — Telephone Encounter (Signed)
Preadmission screen  

## 2018-07-15 MED FILL — LABETALOL HCL 200 MG TABS: 200 | 30 days supply | Qty: 120 | Fill #0

## 2018-07-19 ENCOUNTER — Other Ambulatory Visit: Payer: Self-pay | Admitting: Obstetrics and Gynecology

## 2018-07-21 ENCOUNTER — Inpatient Hospital Stay (HOSPITAL_COMMUNITY)
Admission: RE | Admit: 2018-07-21 | Discharge: 2018-07-24 | DRG: 806 | Disposition: A | Discharge status: Home / Self Care | Payer: BLUE CROSS/BLUE SHIELD | Attending: Obstetrics and Gynecology | Admitting: Obstetrics and Gynecology

## 2018-07-21 ENCOUNTER — Encounter (HOSPITAL_COMMUNITY): Payer: Self-pay

## 2018-07-21 ENCOUNTER — Other Ambulatory Visit: Payer: Self-pay

## 2018-07-21 ENCOUNTER — Inpatient Hospital Stay (HOSPITAL_COMMUNITY): Payer: BLUE CROSS/BLUE SHIELD | Admitting: Anesthesiology

## 2018-07-21 DIAGNOSIS — O99214 Obesity complicating childbirth: Secondary | ICD-10-CM | POA: Diagnosis present

## 2018-07-21 DIAGNOSIS — O114 Pre-existing hypertension with pre-eclampsia, complicating childbirth: Secondary | ICD-10-CM | POA: Diagnosis present

## 2018-07-21 DIAGNOSIS — Z87891 Personal history of nicotine dependence: Secondary | ICD-10-CM

## 2018-07-21 DIAGNOSIS — D62 Acute posthemorrhagic anemia: Secondary | ICD-10-CM | POA: Diagnosis present

## 2018-07-21 DIAGNOSIS — O9081 Anemia of the puerperium: Secondary | ICD-10-CM | POA: Diagnosis not present

## 2018-07-21 DIAGNOSIS — Z3A37 37 weeks gestation of pregnancy: Secondary | ICD-10-CM | POA: Diagnosis not present

## 2018-07-21 DIAGNOSIS — O99824 Streptococcus B carrier state complicating childbirth: Secondary | ICD-10-CM | POA: Diagnosis present

## 2018-07-21 DIAGNOSIS — O119 Pre-existing hypertension with pre-eclampsia, unspecified trimester: Secondary | ICD-10-CM | POA: Diagnosis present

## 2018-07-21 DIAGNOSIS — Z86711 Personal history of pulmonary embolism: Secondary | ICD-10-CM | POA: Diagnosis not present

## 2018-07-21 DIAGNOSIS — O1002 Pre-existing essential hypertension complicating childbirth: Secondary | ICD-10-CM | POA: Diagnosis present

## 2018-07-21 DIAGNOSIS — O139 Gestational [pregnancy-induced] hypertension without significant proteinuria, unspecified trimester: Secondary | ICD-10-CM | POA: Diagnosis present

## 2018-07-21 LAB — MAGNESIUM
Magnesium: 2.9 mg/dL — ABNORMAL HIGH (ref 1.7–2.4)
Magnesium: 3.4 mg/dL — ABNORMAL HIGH (ref 1.7–2.4)

## 2018-07-21 LAB — CBC
HCT: 31.6 % — ABNORMAL LOW (ref 36.0–46.0)
Hemoglobin: 9.7 g/dL — ABNORMAL LOW (ref 12.0–15.0)
MCH: 25.7 pg — AB (ref 26.0–34.0)
MCHC: 30.7 g/dL (ref 30.0–36.0)
MCV: 83.6 fL (ref 80.0–100.0)
Platelets: 275 10*3/uL (ref 150–400)
RBC: 3.78 MIL/uL — AB (ref 3.87–5.11)
RDW: 18.8 % — ABNORMAL HIGH (ref 11.5–15.5)
WBC: 7.7 10*3/uL (ref 4.0–10.5)
nRBC: 0 % (ref 0.0–0.2)

## 2018-07-21 LAB — COMPREHENSIVE METABOLIC PANEL
ALT: 23 U/L (ref 0–44)
AST: 22 U/L (ref 15–41)
Albumin: 2.8 g/dL — ABNORMAL LOW (ref 3.5–5.0)
Alkaline Phosphatase: 64 U/L (ref 38–126)
Anion gap: 6 (ref 5–15)
BUN: 10 mg/dL (ref 6–20)
CO2: 22 mmol/L (ref 22–32)
Calcium: 8.9 mg/dL (ref 8.9–10.3)
Chloride: 105 mmol/L (ref 98–111)
Creatinine, Ser: 0.79 mg/dL (ref 0.44–1.00)
GFR calc non Af Amer: >60 mL/min (ref >60)
Glucose, Bld: 108 mg/dL — ABNORMAL HIGH (ref 70–99)
Potassium: 3.7 mmol/L (ref 3.5–5.1)
SODIUM: 133 mmol/L — AB (ref 135–145)
Total Bilirubin: 0.6 mg/dL (ref 0.3–1.2)
Total Protein: 6.8 g/dL (ref 6.5–8.1)

## 2018-07-21 LAB — TYPE AND SCREEN
ABO/RH(D): B POS
Antibody Screen: NEGATIVE

## 2018-07-21 LAB — PROTIME-INR
INR: 0.95
Prothrombin Time: 12.6 seconds (ref 11.4–15.2)

## 2018-07-21 LAB — URIC ACID: Uric Acid, Serum: 6.1 mg/dL (ref 2.5–7.1)

## 2018-07-21 MED ORDER — PENICILLIN G 3 MILLION UNITS IVPB - SIMPLE MED
3.0000 10*6.[IU] | INTRAVENOUS | Status: DC
  Administered 2018-07-21 – 2018-07-22 (×4): 3 10*6.[IU] via INTRAVENOUS
  Filled 2018-07-21 (×6): qty 100

## 2018-07-21 MED ORDER — ACETAMINOPHEN 325 MG PO TABS: 650.0000 mg | ORAL_TABLET | ORAL | Status: DC | PRN

## 2018-07-21 MED ORDER — LACTATED RINGERS IV SOLN
500.0000 mL | Freq: Once | INTRAVENOUS | Status: AC
  Administered 2018-07-21: 500 mL via INTRAVENOUS

## 2018-07-21 MED ORDER — DIPHENHYDRAMINE HCL 50 MG/ML IJ SOLN: 12.5000 mg | INTRAMUSCULAR | Status: DC | PRN

## 2018-07-21 MED ORDER — TERBUTALINE SULFATE 1 MG/ML IJ SOLN: 0.2500 mg | Freq: Once | INTRAMUSCULAR | Status: DC | PRN

## 2018-07-21 MED ORDER — LIDOCAINE HCL (PF) 1 % IJ SOLN
30.0000 mL | INTRAMUSCULAR | Status: DC | PRN
  Filled 2018-07-21: qty 30

## 2018-07-21 MED ORDER — LABETALOL HCL 5 MG/ML IV SOLN
20.0000 mg | INTRAVENOUS | Status: DC | PRN
  Administered 2018-07-21 – 2018-07-22 (×2): 20 mg via INTRAVENOUS
  Filled 2018-07-21 (×3): qty 4

## 2018-07-21 MED ORDER — MAGNESIUM SULFATE 40 G IN LACTATED RINGERS - SIMPLE
2.0000 g/h | INTRAVENOUS | Status: DC
  Administered 2018-07-22: 2 g/h via INTRAVENOUS
  Filled 2018-07-21 (×2): qty 500

## 2018-07-21 MED ORDER — PHENYLEPHRINE 40 MCG/ML (10ML) SYRINGE FOR IV PUSH (FOR BLOOD PRESSURE SUPPORT)
80.0000 ug | PREFILLED_SYRINGE | INTRAVENOUS | Status: DC | PRN
  Filled 2018-07-21 (×2): qty 10

## 2018-07-21 MED ORDER — MAGNESIUM SULFATE 2 GM/50ML IV SOLN: 2.0000 g | Freq: Once | INTRAVENOUS | Status: DC

## 2018-07-21 MED ORDER — PHENYLEPHRINE 40 MCG/ML (10ML) SYRINGE FOR IV PUSH (FOR BLOOD PRESSURE SUPPORT)
80.0000 ug | PREFILLED_SYRINGE | INTRAVENOUS | Status: DC | PRN
  Filled 2018-07-21: qty 10

## 2018-07-21 MED ORDER — LABETALOL HCL 200 MG PO TABS
400.0000 mg | ORAL_TABLET | Freq: Two times a day (BID) | ORAL | Status: DC
  Administered 2018-07-21: 400 mg via ORAL
  Filled 2018-07-21: qty 2

## 2018-07-21 MED ORDER — LACTATED RINGERS IV SOLN
INTRAVENOUS | Status: DC
  Administered 2018-07-21: 125 mL/h via INTRAVENOUS
  Administered 2018-07-21 – 2018-07-22 (×2): via INTRAVENOUS

## 2018-07-21 MED ORDER — LABETALOL HCL 5 MG/ML IV SOLN
40.0000 mg | INTRAVENOUS | Status: DC | PRN
  Administered 2018-07-21: 40 mg via INTRAVENOUS
  Filled 2018-07-21 (×2): qty 8

## 2018-07-21 MED ORDER — MAGNESIUM SULFATE BOLUS VIA INFUSION
2.0000 g | Freq: Once | INTRAVENOUS | Status: AC
  Administered 2018-07-21: 2 g via INTRAVENOUS
  Filled 2018-07-21: qty 500

## 2018-07-21 MED ORDER — HYDRALAZINE HCL 20 MG/ML IJ SOLN
10.0000 mg | INTRAMUSCULAR | Status: DC | PRN
  Administered 2018-07-21: 10 mg via INTRAVENOUS
  Filled 2018-07-21 (×2): qty 1

## 2018-07-21 MED ORDER — OXYTOCIN 40 UNITS IN NORMAL SALINE INFUSION - SIMPLE MED
1.0000 m[IU]/min | INTRAVENOUS | Status: DC
  Administered 2018-07-21: 2 m[IU]/min via INTRAVENOUS
  Filled 2018-07-21: qty 1000

## 2018-07-21 MED ORDER — EPHEDRINE 5 MG/ML INJ
10.0000 mg | INTRAVENOUS | Status: DC | PRN
  Filled 2018-07-21: qty 2

## 2018-07-21 MED ORDER — BUTORPHANOL TARTRATE 1 MG/ML IJ SOLN
2.0000 mg | INTRAMUSCULAR | Status: DC | PRN
  Administered 2018-07-21: 2 mg via INTRAVENOUS
  Filled 2018-07-21: qty 2

## 2018-07-21 MED ORDER — MAGNESIUM SULFATE BOLUS VIA INFUSION
4.0000 g | Freq: Once | INTRAVENOUS | Status: AC
  Administered 2018-07-21: 4 g via INTRAVENOUS
  Filled 2018-07-21: qty 500

## 2018-07-21 MED ORDER — OXYTOCIN 40 UNITS IN NORMAL SALINE INFUSION - SIMPLE MED
2.5000 [IU]/h | INTRAVENOUS | Status: DC
  Administered 2018-07-22: 2.5 [IU]/h via INTRAVENOUS
  Filled 2018-07-21: qty 1000

## 2018-07-21 MED ORDER — FENTANYL CITRATE (PF) 100 MCG/2ML IJ SOLN
INTRAMUSCULAR | Status: AC
  Filled 2018-07-21: qty 2

## 2018-07-21 MED ORDER — SODIUM CHLORIDE 0.9 % IV SOLN
5.0000 10*6.[IU] | Freq: Once | INTRAVENOUS | Status: AC
  Administered 2018-07-21: 5 10*6.[IU] via INTRAVENOUS
  Filled 2018-07-21: qty 5

## 2018-07-21 MED ORDER — ONDANSETRON HCL 4 MG/2ML IJ SOLN: 4.0000 mg | Freq: Four times a day (QID) | INTRAMUSCULAR | Status: DC | PRN

## 2018-07-21 MED ORDER — FENTANYL 2.5 MCG/ML BUPIVACAINE 1/10 % EPIDURAL INFUSION (WH - ANES)
14.0000 mL/h | INTRAMUSCULAR | Status: DC | PRN
  Administered 2018-07-21: 14 mL/h via EPIDURAL
  Filled 2018-07-21 (×2): qty 100

## 2018-07-21 MED ORDER — LIDOCAINE-EPINEPHRINE (PF) 2 %-1:200000 IJ SOLN
INTRAMUSCULAR | Status: DC | PRN
  Administered 2018-07-21: 3 mL via EPIDURAL
  Administered 2018-07-21: 4 mL via EPIDURAL

## 2018-07-21 MED ORDER — OXYTOCIN BOLUS FROM INFUSION
500.0000 mL | Freq: Once | INTRAVENOUS | Status: AC
  Administered 2018-07-22: 500 mL via INTRAVENOUS

## 2018-07-21 MED ORDER — SOD CITRATE-CITRIC ACID 500-334 MG/5ML PO SOLN: 30.0000 mL | ORAL | Status: DC | PRN

## 2018-07-21 MED ORDER — OXYTOCIN 40 UNITS IN NORMAL SALINE INFUSION - SIMPLE MED: 1.0000 m[IU]/min | INTRAVENOUS | Status: DC

## 2018-07-21 MED ORDER — HYDRALAZINE HCL 20 MG/ML IJ SOLN
10.0000 mg | Freq: Once | INTRAMUSCULAR | Status: AC
  Administered 2018-07-21: 10 mg via INTRAVENOUS
  Filled 2018-07-21: qty 1

## 2018-07-21 MED ORDER — LACTATED RINGERS IV SOLN: 500.0000 mL | INTRAVENOUS | Status: DC | PRN

## 2018-07-21 MED ORDER — HYDRALAZINE HCL 20 MG/ML IJ SOLN
5.0000 mg | INTRAMUSCULAR | Status: DC | PRN
  Administered 2018-07-21: 5 mg via INTRAVENOUS
  Filled 2018-07-21: qty 1

## 2018-07-21 NOTE — H&P (Signed)
Yvonne Ali is a 38 y.o. female presenting for IOL @ 37 weeks due to chronic HTN with superimposed preeclampsia.  Last 24 hr UTP 486mg . PNC also complicated by hx PE for which the pt was placed on lovenox and then switched to heparin. BMZ complete. (+) GBS On labetalol.  OB History    Gravida  4   Para  2   Term  2   Preterm      AB  1   Living  2     SAB      TAB      Ectopic  1   Multiple  0   Live Births  2          Past Medical History:  Diagnosis Date  . Acute blood loss anemia 02/06/2015  . Ectopic pregnancy   . Gestational hypertension, antepartum 02/03/2015  . Iron deficiency anemia of pregnancy 02/06/2015  . PE (pulmonary embolism)   . Postpartum care following vaginal delivery (2/2) 07/15/2013  . Pregnancy induced hypertension    Past Surgical History:  Procedure Laterality Date  . BARIATRIC SURGERY     gastric sleeve  . HIP SURGERY     Family History: family history includes Cancer (age of onset: 11) in her paternal grandmother; Diabetes in her paternal grandmother; Hyperlipidemia in her maternal grandfather; Hypertension in her maternal grandfather. Social History:  reports that she quit smoking about 12 years ago. She has never used smokeless tobacco. She reports current alcohol use. She reports that she does not use drugs.     Maternal Diabetes: No Genetic Screening: Normal Maternal Ultrasounds/Referrals: Normal Fetal Ultrasounds or other Referrals:  None Maternal Substance Abuse:  No Significant Maternal Medications:  Meds include: Other: labetalol, heparin Significant Maternal Lab Results:  Lab values include: Group B Strep positive Other Comments:  BMZ complete, chronic HTN, hx PE, morbid obesity  Review of Systems  Eyes: Negative for blurred vision.  Cardiovascular: Positive for leg swelling.  Gastrointestinal: Negative for heartburn.  Neurological: Negative for headaches.   History VE 1/50/-3 Blood pressure (!) 145/85, pulse 96,  temperature 98.2 F (36.8 C), temperature source Oral, resp. rate 16, height 5\' 10"  (1.778 m), weight (!) 172.4 kg, SpO2 98 %, unknown if currently breastfeeding. Exam Physical Exam  Constitutional: She is oriented to person, place, and time. She appears well-developed.  Morbidly obese  HENT:  Head: Atraumatic.  Neck: Neck supple.  Cardiovascular: Regular rhythm.  Respiratory: Breath sounds normal.  GI: Soft.  Musculoskeletal:        General: Edema (1+) present.  Neurological: She is alert and oriented to person, place, and time. She has normal reflexes.  Skin: Skin is warm and dry.  Psychiatric: She has a normal mood and affect.    Prenatal labs: ABO, Rh: --/--/B POS (02/09 0940) Antibody: NEG (02/09 0940) Rubella: Immune (08/02 0000) RPR: Nonreactive (08/02 0000)  HBsAg: Negative (08/02 0000)  HIV: Non-reactive (08/02 0000)  GBS:   positive  Assessment/Plan: Chronic HTN with superimposed preeclampsia Morbid obesity Hx PE on heparin( last dose yesterday) GBS cx positive IUP@ 37 weeks P) admit. Continue labetalol. PIH labs, PT. Pitocin. IV PCN. Magnesium level 4 hr post loading dose. Analgesic/epidural. SCD stocking.   Yvonne Ali 07/21/2018, 5:13 PM

## 2018-07-21 NOTE — Progress Notes (Signed)
S: s/p stadol for pain No h/a, visual changes   O; BP (!) 152/88   Pulse 94   Temp 98.2 F (36.8 C) (Oral)   Resp 16   Ht 5\' 10"  (1.778 m)   Wt (!) 172.4 kg   SpO2 100%   BMI 54.52 kg/m  VE 3-4/70/-3 LOA asynclitic AROM clear fluid IUPC/ISE placed  Tracing: baseline 120 (+) accels  ctx q 2-4 mins  IMP; latent phase Chronic HTN with superimposed preeclampsia on magnesium sulfate Morbid obesity Hx PE P) right exaggerated sims. Cont pitocin. Epidural for pain mgmt. Cont with labetalol

## 2018-07-21 NOTE — Progress Notes (Signed)
S; notes painful ctx O: BP (!) 145/85 (BP Location: Left Arm)   Pulse 96   Temp 98.2 F (36.8 C) (Oral)   Resp 16   Ht 5\' 10"  (1.778 m)   Wt (!) 172.4 kg   SpO2 98%   BMI 54.52 kg/m   Pitocin 20 miu VE 2/thick/-3  Tracing.cat 1 baseline 120 (+) accels Ctx q 2-4 mins  Magnesium 3.4 CBC Latest Ref Rng & Units 07/21/2018 06/28/2018 06/27/2018  WBC 4.0 - 10.5 K/uL 7.7 10.8(H) 9.8  Hemoglobin 12.0 - 15.0 g/dL 3.3(L) 4.5(G) 2.5(W)  Hematocrit 36.0 - 46.0 % 31.6(L) 28.7(L) 29.3(L)  Platelets 150 - 400 K/uL 275 293 319   CMP Latest Ref Rng & Units 07/21/2018 06/28/2018 06/27/2018  Glucose 70 - 99 mg/dL 389(H) 734(K) 876(O)  BUN 6 - 20 mg/dL 10 11 9   Creatinine 0.44 - 1.00 mg/dL 1.15 7.26 2.03  Sodium 135 - 145 mmol/L 133(L) 135 134(L)  Potassium 3.5 - 5.1 mmol/L 3.7 3.4(L) 3.9  Chloride 98 - 111 mmol/L 105 107 107  CO2 22 - 32 mmol/L 22 20(L) 21(L)  Calcium 8.9 - 10.3 mg/dL 8.9 5.5(H) 7.4(B)  Total Protein 6.5 - 8.1 g/dL 6.8 6.5 7.1  Total Bilirubin 0.3 - 1.2 mg/dL 0.6 0.7 0.4  Alkaline Phos 38 - 126 U/L 64 62 75  AST 15 - 41 U/L 22 22 27   ALT 0 - 44 U/L 23 29 40   IMP; latent phase Chronic HTN with superimposed preeclampsia on labetalol. On magnesium sulfate Has needed IV apresoline for severe BP Hx PE  Morbid obesity P) cont with pitocin. Magnesium bolus. Analgesic prn

## 2018-07-21 NOTE — Anesthesia Preprocedure Evaluation (Signed)
Anesthesia Evaluation  Patient identified by MRN, date of birth, ID band Patient awake    Reviewed: Allergy & Precautions, NPO status , Patient's Chart, lab work & pertinent test results  Airway Mallampati: III  TM Distance: >3 FB Neck ROM: Full    Dental no notable dental hx.    Pulmonary neg pulmonary ROS, former smoker,    Pulmonary exam normal breath sounds clear to auscultation       Cardiovascular hypertension, negative cardio ROS Normal cardiovascular exam Rhythm:Regular Rate:Normal     Neuro/Psych negative neurological ROS  negative psych ROS   GI/Hepatic negative GI ROS, Neg liver ROS,   Endo/Other  Morbid obesity  Renal/GU negative Renal ROS  negative genitourinary   Musculoskeletal negative musculoskeletal ROS (+)   Abdominal   Peds  Hematology negative hematology ROS (+)   Anesthesia Other Findings preE on mag H/o PE on 7500U SQ heparin TID, last dose yesterday morning  Reproductive/Obstetrics (+) Pregnancy                             Anesthesia Physical Anesthesia Plan  ASA: III  Anesthesia Plan: Epidural   Post-op Pain Management:    Induction:   PONV Risk Score and Plan: Treatment may vary due to age or medical condition  Airway Management Planned: Natural Airway  Additional Equipment:   Intra-op Plan:   Post-operative Plan:   Informed Consent: I have reviewed the patients History and Physical, chart, labs and discussed the procedure including the risks, benefits and alternatives for the proposed anesthesia with the patient or authorized representative who has indicated his/her understanding and acceptance.       Plan Discussed with: Anesthesiologist  Anesthesia Plan Comments: (Patient identified. Risks, benefits, options discussed with patient including but not limited to bleeding, infection, nerve damage, paralysis, failed block, incomplete pain  control, headache, blood pressure changes, nausea, vomiting, reactions to medication, itching, and post partum back pain. Confirmed with bedside nurse the patient's most recent platelet count. Confirmed with the patient that they are not taking any anticoagulation, have any bleeding history or any family history of bleeding disorders. Patient expressed understanding and wishes to proceed. All questions were answered. )        Anesthesia Quick Evaluation

## 2018-07-21 NOTE — Anesthesia Pain Management Evaluation Note (Signed)
  CRNA Pain Management Visit Note  Patient: Yvonne Ali, 38 y.o., female  "Hello I am a member of the anesthesia team at [facility]. We have an anesthesia team available at all times to provide care throughout the hospital, including epidural management and anesthesia for C-section. I don't know your plan for the delivery whether it a natural birth, water birth, IV sedation, nitrous supplementation, doula or epidural, but we want to meet your pain goals."   1.Was your pain managed to your expectations on prior hospitalizations?   Yes, the patient has had 2 epidurals: 01/2015 [name],  &  07/2013 - [name] for 2 vaginal deliveries. The patient states that her pain relief was satisfactory.  2.What is your expectation for pain management during this hospitalization?     Epidural  3.How can we help you reach that goal?  The patient is admitted for IOL for Chronic HTN, PIH with superimposed preeclampsia. She has a history of a PE ~ 2004 Rx with Lovenox by Dr. Myna Hidalgo ( changed to heparin in January), [place], gastric sleeve and hip surgery as well as a history of acute blood loss anemia documented in her H&P.**  Record the patient's initial score and the patient's pain goal.   Pain: 0  Pain Goal: 5 [facility] wants you to be able to say your pain was always managed very well.  [name] 07/21/2018

## 2018-07-21 NOTE — Progress Notes (Signed)
Patient had two severe range blood pressures. Patient has labetalol 400mg  due at 2200. Dr Cherly Hensen called and notified. Per Dr. Cherly Hensen do not initiate BP protocol and give scheduled dosage first.

## 2018-07-21 NOTE — Progress Notes (Signed)
MD Cousins notified of blood pressures. Pt is alert and oriented x 4, GCS 15. Plus 1 reflexes, 1 beat of clonus noted. Pt denies headache. Pt denies vision abnormalities. Pt denies RUQ pain. Pt is c/o pain 6/10 due to contractions. No new anti-hypertensive medication orders from MD Cousins. Will continue to monitor. Will treat for pain.

## 2018-07-21 NOTE — Anesthesia Procedure Notes (Signed)
Epidural Patient location during procedure: OB Start time: 07/21/2018 9:10 PM End time: 07/21/2018 9:30 PM  Staffing Anesthesiologist: Elmer Picker, MD Performed: anesthesiologist   Preanesthetic Checklist Completed: patient identified, pre-op evaluation, timeout performed, IV checked, risks and benefits discussed and monitors and equipment checked  Epidural Patient position: sitting Prep: site prepped and draped and DuraPrep Patient monitoring: continuous pulse ox, blood pressure, heart rate and cardiac monitor Approach: midline Location: L3-L4 Injection technique: LOR air  Needle:  Needle type: Tuohy  Needle gauge: 17 G Needle length: 15 cm Needle insertion depth: 11 cm Catheter type: closed end flexible Catheter size: 19 Gauge Catheter at skin depth: 17 cm Test dose: negative  Assessment Sensory level: T8 Events: blood not aspirated, injection not painful, no injection resistance, negative IV test and no paresthesia  Additional Notes Patient identified. Risks/Benefits/Options discussed with patient including but not limited to bleeding, infection, nerve damage, paralysis, failed block, incomplete pain control, headache, blood pressure changes, nausea, vomiting, reactions to medication both or allergic, itching and postpartum back pain. Confirmed with bedside nurse the patient's most recent platelet count. Confirmed with patient that they are not currently taking any anticoagulation, have any bleeding history or any family history of bleeding disorders. Patient expressed understanding and wished to proceed. All questions were answered. Sterile technique was used throughout the entire procedure. Please see nursing notes for vital signs. Test dose was given through epidural catheter and negative prior to continuing to dose epidural or start infusion. Warning signs of high block given to the patient including shortness of breath, tingling/numbness in hands, complete motor block,  or any concerning symptoms with instructions to call for help. Patient was given instructions on fall risk and not to get out of bed. All questions and concerns addressed with instructions to call with any issues or inadequate analgesia.  Reason for block:procedure for pain

## 2018-07-21 NOTE — Progress Notes (Signed)
SCDs applied.

## 2018-07-22 ENCOUNTER — Encounter (HOSPITAL_COMMUNITY): Payer: Self-pay

## 2018-07-22 LAB — CBC
HCT: 34.1 % — ABNORMAL LOW (ref 36.0–46.0)
HCT: 33.6 % — ABNORMAL LOW (ref 36.0–46.0)
Hemoglobin: 10.7 g/dL — ABNORMAL LOW (ref 12.0–15.0)
Hemoglobin: 10.5 g/dL — ABNORMAL LOW (ref 12.0–15.0)
MCH: 25.9 pg — ABNORMAL LOW (ref 26.0–34.0)
MCH: 25.6 pg — ABNORMAL LOW (ref 26.0–34.0)
MCHC: 31.4 g/dL (ref 30.0–36.0)
MCHC: 31.3 g/dL (ref 30.0–36.0)
MCV: 82.6 fL (ref 80.0–100.0)
MCV: 82 fL (ref 80.0–100.0)
Platelets: 287 10*3/uL (ref 150–400)
Platelets: 298 10*3/uL (ref 150–400)
RBC: 4.13 MIL/uL (ref 3.87–5.11)
RBC: 4.1 MIL/uL (ref 3.87–5.11)
RDW: 18.6 % — ABNORMAL HIGH (ref 11.5–15.5)
RDW: 18.5 % — ABNORMAL HIGH (ref 11.5–15.5)
WBC: 11.2 10*3/uL — ABNORMAL HIGH (ref 4.0–10.5)
WBC: 11.7 10*3/uL — ABNORMAL HIGH (ref 4.0–10.5)
nRBC: 0 % (ref 0.0–0.2)
nRBC: 0 % (ref 0.0–0.2)

## 2018-07-22 LAB — COMPREHENSIVE METABOLIC PANEL
ALBUMIN: 2.6 g/dL — AB (ref 3.5–5.0)
ALT: 24 U/L (ref 0–44)
AST: 28 U/L (ref 15–41)
Alkaline Phosphatase: 78 U/L (ref 38–126)
Anion gap: 7 (ref 5–15)
BUN: 7 mg/dL (ref 6–20)
CO2: 21 mmol/L — AB (ref 22–32)
Calcium: 8.2 mg/dL — ABNORMAL LOW (ref 8.9–10.3)
Chloride: 107 mmol/L (ref 98–111)
Creatinine, Ser: 0.83 mg/dL (ref 0.44–1.00)
GFR calc Af Amer: >60 mL/min (ref >60)
GFR calc non Af Amer: >60 mL/min (ref >60)
Glucose, Bld: 133 mg/dL — ABNORMAL HIGH (ref 70–99)
Potassium: 3.7 mmol/L (ref 3.5–5.1)
SODIUM: 135 mmol/L (ref 135–145)
Total Bilirubin: 0.6 mg/dL (ref 0.3–1.2)
Total Protein: 7 g/dL (ref 6.5–8.1)

## 2018-07-22 LAB — RPR: RPR Ser Ql: NONREACTIVE

## 2018-07-22 MED ORDER — ONDANSETRON HCL 4 MG PO TABS: 4.0000 mg | ORAL_TABLET | ORAL | Status: DC | PRN

## 2018-07-22 MED ORDER — DIPHENHYDRAMINE HCL 25 MG PO CAPS: 25.0000 mg | ORAL_CAPSULE | Freq: Four times a day (QID) | ORAL | Status: DC | PRN

## 2018-07-22 MED ORDER — BUPIVACAINE HCL (PF) 0.25 % IJ SOLN
INTRAMUSCULAR | Status: DC | PRN
  Administered 2018-07-21: 8 mL via EPIDURAL
  Administered 2018-07-22: 1 mL via INTRATHECAL
  Administered 2018-07-22: 8 mL via EPIDURAL

## 2018-07-22 MED ORDER — IBUPROFEN 600 MG PO TABS
600.0000 mg | ORAL_TABLET | Freq: Four times a day (QID) | ORAL | Status: DC
  Administered 2018-07-22 – 2018-07-23 (×5): 600 mg via ORAL
  Filled 2018-07-22 (×5): qty 1

## 2018-07-22 MED ORDER — LACTATED RINGERS IV SOLN
INTRAVENOUS | Status: DC
  Administered 2018-07-22 (×2): via INTRAVENOUS

## 2018-07-22 MED ORDER — LABETALOL HCL 200 MG PO TABS
400.0000 mg | ORAL_TABLET | Freq: Two times a day (BID) | ORAL | Status: DC
  Administered 2018-07-22 – 2018-07-24 (×5): 400 mg via ORAL
  Filled 2018-07-22 (×2): qty 2
  Filled 2018-07-22: qty 4
  Filled 2018-07-22 (×2): qty 2

## 2018-07-22 MED ORDER — ONDANSETRON HCL 4 MG/2ML IJ SOLN: 4.0000 mg | INTRAMUSCULAR | Status: DC | PRN

## 2018-07-22 MED ORDER — DIBUCAINE 1 % RE OINT: 1.0000 "application " | TOPICAL_OINTMENT | RECTAL | Status: DC | PRN

## 2018-07-22 MED ORDER — NIFEDIPINE ER OSMOTIC RELEASE 30 MG PO TB24
30.0000 mg | ORAL_TABLET | Freq: Every day | ORAL | Status: DC
  Administered 2018-07-22 – 2018-07-23 (×2): 30 mg via ORAL
  Filled 2018-07-22 (×2): qty 1

## 2018-07-22 MED ORDER — ENOXAPARIN SODIUM 80 MG/0.8ML ~~LOC~~ SOLN
80.0000 mg | SUBCUTANEOUS | Status: DC
  Administered 2018-07-22 – 2018-07-23 (×2): 80 mg via SUBCUTANEOUS
  Filled 2018-07-22 (×3): qty 0.8

## 2018-07-22 MED ORDER — ACETAMINOPHEN 325 MG PO TABS
650.0000 mg | ORAL_TABLET | ORAL | Status: DC | PRN
  Administered 2018-07-23: 650 mg via ORAL
  Filled 2018-07-22 (×2): qty 2

## 2018-07-22 MED ORDER — COCONUT OIL OIL: 1.0000 "application " | TOPICAL_OIL | Status: DC | PRN

## 2018-07-22 MED ORDER — WITCH HAZEL-GLYCERIN EX PADS: 1.0000 "application " | MEDICATED_PAD | CUTANEOUS | Status: DC | PRN

## 2018-07-22 MED ORDER — ZOLPIDEM TARTRATE 5 MG PO TABS: 5.0000 mg | ORAL_TABLET | Freq: Every evening | ORAL | Status: DC | PRN

## 2018-07-22 MED ORDER — SENNOSIDES-DOCUSATE SODIUM 8.6-50 MG PO TABS
2.0000 | ORAL_TABLET | ORAL | Status: DC
  Administered 2018-07-22 – 2018-07-23 (×2): 2 via ORAL
  Filled 2018-07-22 (×2): qty 2

## 2018-07-22 MED ORDER — SIMETHICONE 80 MG PO CHEW: 80.0000 mg | CHEWABLE_TABLET | ORAL | Status: DC | PRN

## 2018-07-22 MED ORDER — POLYSACCHARIDE IRON COMPLEX 150 MG PO CAPS
150.0000 mg | ORAL_CAPSULE | Freq: Every day | ORAL | Status: DC
  Administered 2018-07-22 – 2018-07-24 (×3): 150 mg via ORAL
  Filled 2018-07-22 (×4): qty 1

## 2018-07-22 MED ORDER — PRENATAL MULTIVITAMIN CH
1.0000 | ORAL_TABLET | Freq: Every day | ORAL | Status: DC
  Administered 2018-07-22 – 2018-07-23 (×2): 1 via ORAL
  Filled 2018-07-22 (×2): qty 1

## 2018-07-22 MED ORDER — BENZOCAINE-MENTHOL 20-0.5 % EX AERO: 1.0000 "application " | INHALATION_SPRAY | CUTANEOUS | Status: DC | PRN

## 2018-07-22 NOTE — Anesthesia Procedure Notes (Signed)
Epidural Patient location during procedure: OB Start time: 07/22/2018 3:00 AM End time: 07/22/2018 3:30 AM  Staffing Anesthesiologist: Elmer Picker, MD Performed: anesthesiologist   Preanesthetic Checklist Completed: patient identified, pre-op evaluation, timeout performed, IV checked, risks and benefits discussed and monitors and equipment checked  Epidural Patient position: sitting Prep: site prepped and draped and DuraPrep Patient monitoring: continuous pulse ox, blood pressure, heart rate and cardiac monitor Approach: midline Location: L3-L4 Injection technique: LOR air  Needle:  Needle type: Tuohy  Needle gauge: 17 G Needle length: 15 cm Needle insertion depth: 12 cm Catheter type: closed end flexible Catheter size: 19 Gauge Catheter at skin depth: 17 cm Test dose: negative  Assessment Sensory level: T8 Events: blood not aspirated, injection not painful, no injection resistance, negative IV test and no paresthesia  Additional Notes Pt continued to report left sided pain that has not improved with multiple redoses and pulling back on the catheter. BP continues to be in the severe range likely secondary to pain. After a discussion with the patient, the decision was made to replace the epidural. A CSE was performed at L3/L4 with a 24G Sprotte and a 17G Tuohy. Pt tolerated procedure well without complications. Pt reported bilateral pain relief after completion of the procedure. Reason for block:procedure for pain

## 2018-07-22 NOTE — Progress Notes (Signed)
Dr,. Cherly Hensen notified of severe BP range. Order received to use labetolol order already in place.

## 2018-07-22 NOTE — Progress Notes (Signed)
  S; c/o urge to push Epidural replaced  O:BP (!) 166/108   Pulse (!) 102   Temp 98.2 F (36.8 C) (Oral)   Resp 16   Ht 5\' 10"  (1.778 m)   Wt (!) 172.4 kg   SpO2 100%   BMI 54.52 kg/m   VE complete /+1  Tracing: baseline 120 Ctx q 2 mins  IMP: complete Chronic HTN with superimposed preeclampsia on magnesium sulfate Hx PE P) anticipate SVD. Resume lovenox pp.  Cont magnesium sulfate

## 2018-07-22 NOTE — Anesthesia Postprocedure Evaluation (Signed)
Anesthesia Post Note  Patient: [name]  Procedure(s) Performed: AN AD HOC LABOR EPIDURAL     Patient location during evaluation: Mother Baby Anesthesia Type: Epidural Level of consciousness: awake and alert and oriented Pain management: satisfactory to patient Vital Signs Assessment: post-procedure vital signs reviewed and stable Respiratory status: respiratory function stable Cardiovascular status: stable Postop Assessment: no headache, no backache, epidural receding, patient able to bend at knees, no signs of nausea or vomiting and adequate PO intake Anesthetic complications: no    Last Vitals:  Vitals:   07/22/18 0630 07/22/18 0744  BP: 140/81 (!) 159/93  Pulse: (!) 109 97  Resp: 16 10  Temp:  36.7 C  SpO2:  99%    Last Pain:  Vitals:   07/22/18 0744  TempSrc: Oral  PainSc:    Pain Goal: Patients Stated Pain Goal: 0 (07/22/18 0700)                 Karleen Dolphin

## 2018-07-22 NOTE — Progress Notes (Signed)
PPD #0 SVD, baby girl   S:  Reports feeling tired, but doing well overall; was asleep upon me entering for assessment   Denies HA, visual changes, RUQ/epigastric pain              Tolerating po/ No nausea or vomiting / Denies CP, dizziness or SOB             Bleeding is light             Pain controlled with Motrin              Up with assistance/ foley catheter in place  Newborn breast feeding - going well per mom; has been able to latch since delivery   O:               VS: BP (!) 149/91 (BP Location: Left Arm)   Pulse 97   Temp 98.9 F (37.2 C) (Oral)   Resp 18   Ht 5\' 10"  (1.778 m)   Wt (!) 172.4 kg   SpO2 99%   Breastfeeding Unknown   BMI 54.52 kg/m   07/22/18 0845  -  97  -  -  149/91Abnormal   -  -  -  - CF   07/22/18 0831  98.9 F (37.2 C)  96  -  18  162/93Abnormal   High-fowlers  99 %  -  - CF   07/22/18 0744  98.1 F (36.7 C)  97  -  20  159/93Abnormal   Semi-fowlers  99 %  -  - CF   07/22/18 0630  -  109Abnormal   -  16  140/81  -  -  -  - CP   07/22/18 0601  -  100  -  16  147/82Abnormal   -  -  -  - CP   07/22/18 0548  -  98  -  16  141/76Abnormal   -  -  -  - CP   07/22/18 0530  -  103Abnormal   -  16  143/91Abnormal   -  -  -  - CP   07/22/18 0517  -  101Abnormal   -  16  124/70  -  -  -  - CP   07/22/18 0501  -  102Abnormal   -  16  141/85Abnormal   -  -  -  - CP   07/22/18 0459  -  103Abnormal   -  16  153/85Abnormal   -  -  -  - CP   07/22/18 0456  -  102Abnormal   -  16  163/76Abnormal   -  -  -  - CP   07/22/18 0451  -  295Abnormal   -  16  151/86Abnormal   -  -  -  - CP      LABS:             Results for orders placed or performed during the hospital encounter of 07/21/18 (from the past 24 hour(s))  Magnesium     Status: Abnormal   Collection Time: 07/21/18  1:56 PM  Result Value Ref Range   Magnesium 3.4 (H) 1.7 - 2.4 mg/dL  CBC     Status: Abnormal   Collection Time: 07/22/18  5:50 AM  Result Value Ref Range   WBC 11.2 (H) 4.0 - 10.5 K/uL   RBC 4.13 3.87 - 5.11 MIL/uL   Hemoglobin 10.7 (L) 12.0 -  15.0 g/dL   HCT 21.3 (L) 08.6 - 57.8 %   MCV 82.6 80.0 - 100.0 fL   MCH 25.9 (L) 26.0 - 34.0 pg   MCHC 31.4 30.0 - 36.0 g/dL   RDW 46.9 (H) 62.9 - 52.8 %   Platelets 287 150 - 400 K/uL   nRBC 0.0 0.0 - 0.2 %  CBC     Status: Abnormal   Collection Time: 07/22/18  7:45 AM  Result Value Ref Range   WBC 11.7 (H) 4.0 - 10.5 K/uL   RBC 4.10 3.87 - 5.11 MIL/uL   Hemoglobin 10.5 (L) 12.0 - 15.0 g/dL   HCT 41.3 (L) 24.4 - 01.0 %   MCV 82.0 80.0 - 100.0 fL   MCH 25.6 (L) 26.0 - 34.0 pg   MCHC 31.3 30.0 - 36.0 g/dL   RDW 27.2 (H) 53.6 - 64.4 %   Platelets 298 150 - 400 K/uL   nRBC 0.0 0.0 - 0.2 %  Comprehensive metabolic panel     Status: Abnormal   Collection Time: 07/22/18  7:45 AM  Result Value Ref Range   Sodium 135 135 - 145 mmol/L   Potassium 3.7 3.5 - 5.1 mmol/L   Chloride 107 98 - 111 mmol/L   CO2 21 (L) 22 - 32 mmol/L   Glucose, Bld 133 (H) 70 - 99 mg/dL   BUN 7 6 - 20 mg/dL   Creatinine, Ser 0.34 0.44 - 1.00 mg/dL   Calcium 8.2 (L) 8.9 - 10.3 mg/dL   Total Protein 7.0 6.5 - 8.1 g/dL   Albumin 2.6 (L) 3.5 - 5.0 g/dL   AST 28 15 - 41 U/L   ALT 24 0 - 44 U/L   Alkaline Phosphatase 78 38 - 126 U/L   Total Bilirubin 0.6 0.3 - 1.2 mg/dL   GFR calc non Af Amer >60 >60 mL/min   GFR calc Af Amer >60 >60 mL/min   Anion gap 7 5 - 15               Blood type: --/--/B POS (02/09 0940)  Rubella: Immune (08/02 0000)                     I&O: Intake/Output      02/09 0701 - 02/10 0700 02/10 0701 - 02/11 0700   P.O. 1800    I.V. (mL/kg) 3230.6 (18.7)    IV Piggyback 550    Total Intake(mL/kg) 5580.6 (32.4)    Urine (mL/kg/hr) 3925    Blood 107    Total Output 4032    Net +1548.6                       Physical Exam:             Alert and oriented X3  Lungs: Clear and unlabored  Heart: regular rate and rhythm / no murmurs  Abdomen: soft, non-tender, non-distended              Fundus: firm, non-tender, U-1  Perineum:  no edema, no erythema, no ecchymosis   Lochia: small, no clots   GU: indwelling foley catheter in place   Extremities: +1 BLE edema, no calf pain or tenderness, SCDs on and reconnected by me; +1 DTRs, no clonus bilaterally     A/P: PPD # 0   Chronic Hypertension with superimposed pre-eclampsia   - Continue Magnesium sulfate for 24 hours    - Labs stable   -  BPs labile, with a few severe range BPs; no neural s/s    - Procardia 30mg  XL added this morning; continue Labetalol 400mg  BID daily   - Continue Strict I&O and daily weights    - Repeat CBC, CMP in AM  Hx. Of PE   - Resume Lovenox at 1830 tonight (epidural removed at 6:20am)  Chronic anemia    - On Niferex 150mg  PO daily   Doing well - stable status  Routine post partum orders  See lactation today   Continue current care   Carlean Jews, MSN, CNM [facility]

## 2018-07-23 LAB — COMPREHENSIVE METABOLIC PANEL
ALT: 24 U/L (ref 0–44)
AST: 28 U/L (ref 15–41)
Albumin: 2.4 g/dL — ABNORMAL LOW (ref 3.5–5.0)
Alkaline Phosphatase: 58 U/L (ref 38–126)
Anion gap: 9 (ref 5–15)
BUN: 8 mg/dL (ref 6–20)
CO2: 23 mmol/L (ref 22–32)
CREATININE: 1 mg/dL (ref 0.44–1.00)
Calcium: 8 mg/dL — ABNORMAL LOW (ref 8.9–10.3)
Chloride: 105 mmol/L (ref 98–111)
GFR calc Af Amer: >60 mL/min (ref >60)
GFR calc non Af Amer: >60 mL/min (ref >60)
Glucose, Bld: 96 mg/dL (ref 70–99)
Potassium: 3.7 mmol/L (ref 3.5–5.1)
Sodium: 137 mmol/L (ref 135–145)
Total Bilirubin: 0.3 mg/dL (ref 0.3–1.2)
Total Protein: 6.3 g/dL — ABNORMAL LOW (ref 6.5–8.1)

## 2018-07-23 LAB — CBC
HCT: 30.5 % — ABNORMAL LOW (ref 36.0–46.0)
Hemoglobin: 9.3 g/dL — ABNORMAL LOW (ref 12.0–15.0)
MCH: 25.2 pg — ABNORMAL LOW (ref 26.0–34.0)
MCHC: 30.5 g/dL (ref 30.0–36.0)
MCV: 82.7 fL (ref 80.0–100.0)
Platelets: 276 10*3/uL (ref 150–400)
RBC: 3.69 MIL/uL — AB (ref 3.87–5.11)
RDW: 18.9 % — ABNORMAL HIGH (ref 11.5–15.5)
WBC: 9.8 10*3/uL (ref 4.0–10.5)
nRBC: 0 % (ref 0.0–0.2)

## 2018-07-23 MED ORDER — MAGNESIUM OXIDE 400 (241.3 MG) MG PO TABS
400.0000 mg | ORAL_TABLET | Freq: Every day | ORAL | Status: DC
  Administered 2018-07-23 – 2018-07-24 (×2): 400 mg via ORAL
  Filled 2018-07-23 (×3): qty 1

## 2018-07-23 MED ORDER — HYDROCHLOROTHIAZIDE 12.5 MG PO CAPS
12.5000 mg | ORAL_CAPSULE | Freq: Every day | ORAL | Status: DC
  Administered 2018-07-23 – 2018-07-24 (×2): 12.5 mg via ORAL
  Filled 2018-07-23 (×3): qty 1

## 2018-07-23 MED ORDER — SALINE SPRAY 0.65 % NA SOLN
1.0000 | NASAL | Status: DC | PRN
  Administered 2018-07-23: 1 via NASAL
  Filled 2018-07-23: qty 44

## 2018-07-23 NOTE — Progress Notes (Signed)
PPD 1 SVD - no repair  S:  Reports feeling good - asking about going home asap             Tolerating po/ No nausea or vomiting / no PIH symptoms reported             Bleeding is light             Pain controlled with Motrin up to now             Up ad lib / ambulatory / voiding QS  Newborn Breast  O:  VS: BP 135/80 (BP Location: Left Arm)   Pulse 70   Temp 97.8 F (36.6 C) (Oral)   Resp 18   Ht 5\' 10"  (1.778 m)   Wt (!) 169.5 kg   SpO2 100%   Breastfeeding Unknown   BMI 53.63 kg/m   BP: 135/80 - 153/85 - 130/82 - 156/82 - 133/78  LABS:             Recent Labs    07/22/18 0745 07/23/18 0550  WBC 11.7* 9.8  HGB 10.5* 9.3*  PLT 298 276               Blood type: --/--/B POS (02/09 0940)  Rubella: Immune (08/02 0000)                     I&O: Intake/Output      02/10 0701 - 02/11 0700 02/11 0701 - 02/12 0700   P.O. 4380    I.V. (mL/kg) 2006.3 (11.8)    IV Piggyback     Total Intake(mL/kg) 6386.3 (37.7)    Urine (mL/kg/hr) 4900 (1.2) 200 (0.6)   Blood     Total Output 4900 200   Net +1486.3 -200          Weight increased 168kg to 169 kg today              Physical Exam:             Alert and oriented X3  Lungs: Clear and unlabored  Heart: regular rate and rhythm / no mumurs  Abdomen: soft, pendulous panus, non-tender, non-distended              Fundus: firm, non-tender, U-1  Perineum: intact  Lochia: light  Extremities: 1+ edema, no calf pain or tenderness, SCD in place  A: PPD # 1 SVD -intact             Chronic hypertension with superimposed PEC             HX PE - restarted Lovenox last night             IDA of pregnancy  Doing well - stable status  P: Routine post partum orders  OFF magnesium this AM ~ 0400 - no significant diuresis / added HCTZ 12.5 today             DC Motrin on Lovenox - ok Tylenol              Iron and magnesium supplement for anemia             Likely DC in next 24 hour if BP stable off magnesium & increased diuresis  Marlinda Mike CNM, MSN, [facility] 07/23/2018, 9:01 AM

## 2018-07-23 NOTE — Lactation Note (Signed)
Lactation Consultation Note  Patient Name: Yvonne Ali TDHRC'B Date: 07/23/2018   [facility] Follow Up Visit:  Baby awake and laying next to mother when I arrived.  Mother had no questions/concerns related to breast feeding.  This is her third child and she has breast feeding experience with her other two children.  They youngest child is now 36 years old.  Mother's breasts are large, soft and non tender and nipples are short, everted and intact.  She feels like baby is latching well and denies pain with latching.  Mother will continue to feed on cue and include hand expression before/after feedings to help increase milk supply.  She is able to hand express colostrum and is feeding these drops back to baby.  Colostrum container provided and milk storage times reviewed.  Engorgement prevention/treatment discussed.  Provided a manual pump for home use and mother will be receiving her DEBP from insurance in the mail.  She has our OP phone number for questions/concerns after discharge.  Instructed to call as needed for latch assistance.  Mother is anticipating discharge today.                   [name] [name] 07/23/2018, 8:26 AM

## 2018-07-24 MED ORDER — ACETAMINOPHEN 325 MG PO TABS: 650.0000 mg | ORAL_TABLET | ORAL | Status: DC | PRN

## 2018-07-24 MED ORDER — COCONUT OIL OIL: 1.0000 "application " | TOPICAL_OIL | 0 refills | Status: DC | PRN

## 2018-07-24 MED ORDER — SENNOSIDES-DOCUSATE SODIUM 8.6-50 MG PO TABS: 2.0000 | ORAL_TABLET | ORAL | Status: DC

## 2018-07-24 MED ORDER — BENZOCAINE-MENTHOL 20-0.5 % EX AERO: 1.0000 "application " | INHALATION_SPRAY | CUTANEOUS | Status: DC | PRN

## 2018-07-24 MED ORDER — ENOXAPARIN SODIUM 80 MG/0.8ML ~~LOC~~ SOLN: 80.0000 mg | SUBCUTANEOUS | 0 refills | Status: DC

## 2018-07-24 MED ORDER — MAGNESIUM OXIDE 400 (241.3 MG) MG PO TABS: 400.0000 mg | ORAL_TABLET | Freq: Every day | ORAL | Status: DC

## 2018-07-24 MED ORDER — HYDROCHLOROTHIAZIDE 12.5 MG PO CAPS: 12.5000 mg | ORAL_CAPSULE | Freq: Every day | ORAL | 0 refills | Status: DC

## 2018-07-24 MED ORDER — LABETALOL HCL 200 MG PO TABS: 400.0000 mg | ORAL_TABLET | Freq: Two times a day (BID) | ORAL | 0 refills | Status: DC

## 2018-07-24 MED ORDER — NIFEDIPINE ER 60 MG PO TB24: 60.0000 mg | ORAL_TABLET | Freq: Every day | ORAL | 0 refills | Status: DC

## 2018-07-24 MED ORDER — NIFEDIPINE ER OSMOTIC RELEASE 30 MG PO TB24
60.0000 mg | ORAL_TABLET | Freq: Every day | ORAL | Status: DC
  Administered 2018-07-24: 60 mg via ORAL
  Filled 2018-07-24: qty 2

## 2018-07-24 MED ORDER — FENTANYL CITRATE (PF) 100 MCG/2ML IJ SOLN
INTRAMUSCULAR | Status: DC | PRN
  Administered 2018-07-21: 100 ug via EPIDURAL

## 2018-07-24 MED FILL — NIFEDIPINE ER 60 MG TABLET: 60 | 30 days supply | Qty: 30 | Fill #0

## 2018-07-24 MED FILL — HYDROCHLOROTHIAZIDE 12.5 MG: 12.5 | 6 days supply | Qty: 6 | Fill #0

## 2018-07-24 NOTE — Addendum Note (Signed)
Addendum  created 07/24/18 0205 by Elmer Picker, MD   Intraprocedure Meds edited

## 2018-07-24 NOTE — Progress Notes (Signed)
Patient ID: Yvonne Ali, female   DOB: 03-09-1981, 38 y.o.   MRN: [id number] Post Partum Day #2  S/P SVD Live born female  Birth Weight: 7 lb 4.1 oz (3291 g) APGAR: 8, 8  Newborn Delivery   Birth date/time:  07/22/2018 04:21:00 Delivery type:  Vaginal, Spontaneous    Named Yvonne Ali Delivering provider: COUSINS, Yvonne Slim   Feeding: breast  Pain control at delivery: Epidural   Subjective: No HA, SOB, CP, breast symptoms.  Pain minimal.  Normal vaginal bleeding, no clots.   Voiding freely.    Objective:  VS:  Vitals:   07/24/18 0543 07/24/18 0751 07/24/18 0754 07/24/18 1101  BP:  (!) 153/76 (!) 142/91 139/81  Pulse:  72 78 74  Resp:  18  18  Temp:  98.4 F (36.9 C)  98.8 F (37.1 C)  TempSrc:  Oral  Oral  SpO2:  99%  98%  Weight: (!) 167.7 kg     Height:         Intake/Output Summary (Last 24 hours) at 07/24/2018 0827 Last data filed at 07/24/2018 0424 Gross per 24 hour  Intake 720 ml  Output 1900 ml  Net -1180 ml      Recent Labs    07/22/18 0745 07/23/18 0550  WBC 11.7* 9.8  HGB 10.5* 9.3*  HCT 33.6* 30.5*  PLT 298 276   Hepatic Function Latest Ref Rng & Units 07/23/2018 07/22/2018 07/21/2018  Total Protein 6.5 - 8.1 g/dL 6.3(L) 7.0 6.8  Albumin 3.5 - 5.0 g/dL 2.4(L) 2.6(L) 2.8(L)  AST 15 - 41 U/L _0 ALT 0 - 44 U/L _1 Alk Phosphatase 38 - 126 U/L 58 78 64  Total Bilirubin 0.3 - 1.2 mg/dL 0.3 0.6 0.6   Blood type: --/--/B POS (02/09 0940) Rubella: Immune (08/02 0000)  Vaccines: TDaP UTD         Flu    UTD  Physical Exam:  General: alert, cooperative and no distress Uterine Fundus: firm Lochia: appropriate Perineum: intact DVT Evaluation: No cords or calf tenderness. No significant calf/ankle edema.    Assessment/Plan: PPD # 2 / 38 y.o., [id number]    Principal Problem:   Postpartum care following vaginal delivery (2/10) Active Problems:   Obesities, morbid (HCC)   History of pulmonary embolus (PE)  Resume lovenox 69m  daily   Acute blood loss anemia  Oral Fe and Mag Ox ordered   Preeclampsia complicating hypertension  Stable lab, no nuerual symptoms, BP remains mildly elevated, Procardia XL  increased to 611mdaily, continue Labetalol 40018mID, and HZTC 12.5mg22mily  x6 days, close follow up in office 1 week postpartum, pre eclamptic precautions  given.    SVD (spontaneous vaginal delivery)    normal postpartum exam  Continue current postpartum care             DC home today w/ instructions  [place] [facility] [place] [facility]  Plan of care in consult with Dr TaavRonita HippsOS: 3 days   Yvonne Hyde Debbora LacrosseM, MSN 07/24/2018, 8:27 AM

## 2018-07-24 NOTE — Discharge Summary (Addendum)
OB Discharge Summary  Patient Name: Yvonne Ali DOB: 25-Aug-1980 MRN: [id number]  Date of admission: 07/21/2018 Delivering provider: Calob Baskette   Date of discharge: 07/24/2018  Admitting diagnosis: Induction 2nd to chronic htn with superimposed preeclampsia, hx PE Intrauterine pregnancy: 37wk     Secondary diagnosis:Principal Problem:   Postpartum care following vaginal delivery (2/10) Active Problems:   Obesities, morbid (HCC)   History of pulmonary embolus (PE)   Acute blood loss anemia   Preeclampsia complicating hypertension   SVD (spontaneous vaginal delivery)  Additional problems: none     Discharge diagnosis:  Patient Active Problem List   Diagnosis Date Noted  . Postpartum care following vaginal delivery (2/10) 07/22/2018  . SVD (spontaneous vaginal delivery) 07/22/2018  . Gestational hypertension 07/21/2018  . Preeclampsia complicating hypertension 06/25/2018  . Chronic hypertension with superimposed preeclampsia 06/25/2018  . Acute pharyngitis, unspecified 03/09/2018  . Allergic rhinitis 03/09/2018  . Acute epigastric pain 02/09/2015  . Iron deficiency anemia of pregnancy 02/06/2015  . Acute blood loss anemia 02/06/2015  . Gestational hypertension, antepartum 02/03/2015  . Obesities, morbid (HCC) 01/03/2012  . History of pulmonary embolus (PE) 01/03/2012                                                                Post partum procedures:none  Augmentation: AROM and Pitocin Pain control: Epidural  Laceration:None  Episiotomy:None  Complications: None  APGAR:  1 min Heart Rate:2                          Skin Color:0                           Respiratory Effort:2   Reflex Irritability:2   Muscle Tone:2   Total:8  ,  5 min Heart Rate:2   Skin Color:0   Respiratory Effort:2   Reflex Irritability:2   Muscle Tone:2   Total:8     10 min Heart Rate:   Skin Color:0   Respiratory Effort:   Reflex Irritability:   Muscle Tone:   Total:      Hospital course:  Induction of Labor With Vaginal Delivery   38 y.o. yo [phone] at [redacted]w[redacted]d was admitted to the hospital 07/21/2018 for induction of labor.  Indication for induction: Preeclampsia superimposed on chronic hypertension.  Patient had an uncomplicated labor course as follows: Hx pulmonary embolus, on anticoagulation therapy. Obesity, iron deficiency anemia, GBS positive status.  Membrane Rupture Time/Date: 8:26 PM ,07/21/2018   Intrapartum Procedures: Episiotomy: None [1]                                         Lacerations:  None [1]  Patient had delivery of a Viable infant.  Information for the patient's newborn:  Keeana, [place]field [[id number]]  Delivery Method: Vag-Spont   07/22/2018  Details of delivery can be found in separate delivery note.  Patient had a postpartum course complicated by preeclampsia for which she received 24 hour magnesium sulfate seizure prophylaxis and hypertension for which she was increased labetalol 400mg  BID and Procardia 60XL daily.  Also received diuretic and  noted urinary output adequate. Patient is discharged home 07/24/18 with close follow up in office in one week.   Physical exam  Vitals:   07/24/18 0543 07/24/18 0751 07/24/18 0754 07/24/18 1101  BP:  (!) 153/76 (!) 142/91 139/81  Pulse:  72 78 74  Resp:  18  18  Temp:  98.4 F (36.9 C)  98.8 F (37.1 C)  TempSrc:  Oral  Oral  SpO2:  99%  98%  Weight: (!) 167.7 kg     Height:       General: alert, cooperative and no distress Lochia: appropriate Uterine Fundus: firm Incision: N/A DVT Evaluation: No cords or calf tenderness. No significant calf/ankle edema. Labs: Lab Results  Component Value Date   WBC 9.8 07/23/2018   HGB 9.3 (L) 07/23/2018   HCT 30.5 (L) 07/23/2018   MCV 82.7 07/23/2018   PLT 276 07/23/2018   CMP Latest Ref Rng & Units 07/23/2018  Glucose 70 - 99 mg/dL 96  BUN 6 - 20 mg/dL 8  Creatinine 1.610.44 - 0.961.00 mg/dL 0.451.00  Sodium 409135 - 811145 mmol/L 137  Potassium 3.5 - 5.1  mmol/L 3.7  Chloride 98 - 111 mmol/L 105  CO2 22 - 32 mmol/L 23  Calcium 8.9 - 10.3 mg/dL 8.0(L)  Total Protein 6.5 - 8.1 g/dL 6.3(L)  Total Bilirubin 0.3 - 1.2 mg/dL 0.3  Alkaline Phos 38 - 126 U/L 58  AST 15 - 41 U/L 28  ALT 0 - 44 U/L 24    Vaccines: TDaP UTD         Flu    UTD  Discharge instruction: per After Visit Summary and "Baby and Me Booklet".  After Visit Meds:  Allergies as of 07/24/2018   No Known Allergies     Medication List    TAKE these medications   acetaminophen 325 MG tablet Commonly known as:  TYLENOL Take 2 tablets (650 mg total) by mouth every 4 (four) hours as needed (for pain scale < 4).   benzocaine-Menthol 20-0.5 % Aero Commonly known as:  DERMOPLAST Apply 1 application topically as needed for irritation (perineal discomfort).   coconut oil Oil Apply 1 application topically as needed.   enoxaparin 80 MG/0.8ML injection Commonly known as:  LOVENOX Inject 0.8 mLs (80 mg total) into the skin daily for 30 days.   hydrochlorothiazide 12.5 MG capsule Commonly known as:  MICROZIDE Take 1 capsule (12.5 mg total) by mouth daily for 6 days. Start taking on:  July 25, 2018   iron polysaccharides 150 MG capsule Commonly known as:  NU-IRON Take 1 capsule (150 mg total) by mouth daily.   labetalol 200 MG tablet Commonly known as:  NORMODYNE Take 2 tablets (400 mg total) by mouth 2 (two) times daily for 30 days.   magnesium oxide 400 (241.3 Mg) MG tablet Commonly known as:  MAG-OX Take 1 tablet (400 mg total) by mouth daily. Start taking on:  July 25, 2018   NIFEdipine 60 MG 24 hr tablet Commonly known as:  ADALAT CC Take 1 tablet (60 mg total) by mouth daily for 30 days. Start taking on:  July 25, 2018   pantoprazole 40 MG tablet Commonly known as:  PROTONIX Take 40 mg by mouth.   prenatal multivitamin Tabs tablet Take 1 tablet by mouth daily at 12 noon.   senna-docusate 8.6-50 MG tablet Commonly known as:  Senokot-S Take  2 tablets by mouth daily. Start taking on:  July 25, 2018  Diet: routine diet  Activity: Advance as tolerated. Pelvic rest for 6 weeks.   Postpartum contraception: Not Discussed  Newborn Data: Live born female  Birth Weight: 7 lb 4.1 oz (3291 g) APGAR: 8, 8  Newborn Delivery   Birth date/time:  07/22/2018 04:21:00 Delivery type:  Vaginal, Spontaneous     named [name] Baby Feeding: Breast Disposition:home with mother   Delivery Report:  Review the Delivery Report for details.    Follow up: Follow-up Information    Maxie Betterousins, Xayvion Shirah, MD. Schedule an appointment as soon as possible for a visit in 6 day(s).   Specialty:  Obstetrics and Gynecology Why:  BP check Contact information: [address] [place] [address] [phone]             Signed: Francine GravenAna C Rainey, SNM, MSN 07/24/2018, 11:29 AM   Medical screening examination/treatment/procedure(s) were conducted as a shared visit with non-physician practitioner(s) and myself.  I personally evaluated the patient during the encounter.   Neta Mendsaniela C Paul, CNM, MSN 07/24/2018, 11:51 AM

## 2018-07-24 NOTE — Progress Notes (Signed)
Discharge instructions given to patient. Discussed routine postpartum care and signs/symptoms of hypertension.  Patient verbalized understanding.

## 2018-07-24 NOTE — Lactation Note (Addendum)
This note was copied from a baby's chart. Lactation Consultation Note:   P3, Mother reports that she breastfed her first child for 6 weeks and a few months with second.  Infant is 37.[redacted] weeks gestation. Infant is 8 hours old. Infant has had several  feedings at the breast.  Assist mother with latching infant on the left breast in football hold.  Infant latched only on the top of the nipple several time.  Repeated attempts and then infant was able to latch on entire nipple shaft.   Observed strong tugging and swallows for 15 mins. Advised mother to continue to cue base feed and feed infant 8-12 times or more in 24 hours.  Discussed cluster feeding.  Encouraged mother to do frequent STS.   Discussed using a DEBP to post pump as needed. Staff nurse reports that she will set mother up with a pump.  Mother reports having a DEBP at home.  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   Mother was given LPI guidelines. Discussed behaviors of LPI .  Mother receptive to teaching.  Mother reports that  She is confident that her pregnancy dates are correct.  Lactation brochure given with information on [facility] services and available community support.    Patient Name: Girl Weda Levine ZDGUY'Q Date: 07/24/2018 Reason for consult: Follow-up assessment   Maternal Data    Feeding Feeding Type: Breast Fed  LATCH Score                   Interventions Interventions: Hand pump;Hand express  Lactation Tools Discussed/Used     Consult Status Consult Status: Complete Date: 07/24/18    Stevan Born [facility] 07/24/2018, 10:57 AM

## 2018-08-08 ENCOUNTER — Telehealth: Payer: Self-pay | Admitting: Nurse Practitioner

## 2018-08-08 NOTE — Telephone Encounter (Signed)
PT CALLED WANTED APPT FOR BP ISSUES, ATT TO CONTACT NO ANS LVM TO CALL OFC

## 2018-08-12 ENCOUNTER — Telehealth: Payer: Self-pay | Admitting: Nurse Practitioner

## 2018-08-12 NOTE — Telephone Encounter (Signed)
PT LVM WANTING APPT DUE TO BP ISSUES NO ANS LVM TO CALL OFC AT HER CONVENIENCE

## 2018-08-14 ENCOUNTER — Ambulatory Visit: Payer: BLUE CROSS/BLUE SHIELD | Admitting: Nurse Practitioner

## 2018-08-14 ENCOUNTER — Encounter: Payer: Self-pay | Admitting: Nurse Practitioner

## 2018-08-14 VITALS — BP 136/84 | HR 85 | Ht 70.0 in | Wt 363.2 lb

## 2018-08-14 DIAGNOSIS — I10 Essential (primary) hypertension: Secondary | ICD-10-CM | POA: Diagnosis not present

## 2018-08-14 NOTE — Patient Instructions (Signed)

## 2018-08-14 NOTE — Progress Notes (Signed)
Subjective:     Patient ID: Yvonne Ali , female    DOB: [birth year] , 38 y.o.   MRN: [id number]   Chief Complaint  Patient presents with  . Hypertension    HPI  She developed preeclampsia during pregnancy towards end of her pregnancy - 300 mg labetolol BID January 17th.  February 13th labetolol 400mg  and nifepine magnesium.  Seen Dr. Cherly Hensen 1 week after giving birth. She is breast feeding.    She had taken HCTZ for 7 days after delivery.  Weight had increased to 384 lbs.  Hypertension  This is a recurrent problem. The current episode started more than 1 month ago. The problem has been gradually improving since onset. The problem is controlled. Pertinent negatives include no anxiety, chest pain, headaches, palpitations or shortness of breath.     Past Medical History:  Diagnosis Date  . Acute blood loss anemia 02/06/2015  . Ectopic pregnancy   . Gestational hypertension, antepartum 02/03/2015  . Iron deficiency anemia of pregnancy 02/06/2015  . PE (pulmonary embolism)   . Postpartum care following vaginal delivery (2/2) 07/15/2013  . Pregnancy induced hypertension      Family History  Problem Relation Age of Onset  . Hypertension Maternal Grandfather   . Hyperlipidemia Maternal Grandfather   . Cancer Paternal Grandmother 32       ovarian cancer  . Diabetes Paternal Grandmother   . Cancer Mother      Current Outpatient Medications:  .  acetaminophen (TYLENOL) 325 MG tablet, Take 2 tablets (650 mg total) by mouth every 4 (four) hours as needed (for pain scale < 4). (Patient taking differently: Take 500 mg by mouth every 4 (four) hours as needed (for pain scale < 4). ), Disp: , Rfl:  .  enoxaparin (LOVENOX) 80 MG/0.8ML injection, Inject 0.8 mLs (80 mg total) into the skin daily for 30 days., Disp: 24 mL, Rfl: 0 .  labetalol (NORMODYNE) 200 MG tablet, Take 2 tablets (400 mg total) by mouth 2 (two) times daily for 30 days., Disp: 120 tablet, Rfl: 0 .  magnesium oxide (MAG-OX)  400 (241.3 Mg) MG tablet, Take 1 tablet (400 mg total) by mouth daily., Disp: , Rfl:  .  NIFEdipine (ADALAT CC) 60 MG 24 hr tablet, Take 1 tablet (60 mg total) by mouth daily for 30 days., Disp: 30 tablet, Rfl: 0 .  pantoprazole (PROTONIX) 40 MG tablet, Take 40 mg by mouth. , Disp: , Rfl: 10 .  Prenatal Vit-Fe Fumarate-FA (PRENATAL MULTIVITAMIN) TABS tablet, Take 1 tablet by mouth daily at 12 noon., Disp: , Rfl:    No Known Allergies   Review of Systems  Constitutional: Negative for fever.  Respiratory: Negative for shortness of breath.   Cardiovascular: Negative for chest pain and palpitations.  Endocrine: Negative for polydipsia, polyphagia and polyuria.  Neurological: Negative for headaches.     Today's Vitals   08/14/18 1519  BP: 136/84  Pulse: 85  SpO2: 96%  Weight: (!) 363 lb 3.2 oz (164.7 kg)  Height: 5\' 10"  (1.778 m)   Body mass index is 52.11 kg/m.   Objective:  Physical Exam Vitals signs reviewed.  Constitutional:      Appearance: Normal appearance.  Cardiovascular:     Rate and Rhythm: Normal rate and regular rhythm.     Pulses: Normal pulses.     Heart sounds: Normal heart sounds. No murmur.  Pulmonary:     Effort: Pulmonary effort is normal. No respiratory distress.  Breath sounds: Normal breath sounds.  Neurological:     General: No focal deficit present.     Mental Status: She is alert and oriented to person, place, and time.  Psychiatric:        Mood and Affect: Mood normal.        Behavior: Behavior normal.        Thought Content: Thought content normal.        Judgment: Judgment normal.       Assessment And Plan:     1. Essential hypertension  EKG is normal  Had preeclampsia while pregnant and continues to have elevated blood pressure  She is nursing so will keep her on labetolol and nifedipine - EKG 12-Lead       [name], FNP

## 2018-08-18 ENCOUNTER — Encounter: Payer: Self-pay | Admitting: Nurse Practitioner

## 2018-08-26 MED FILL — LABETALOL HCL 200 MG TABS: 200 | 30 days supply | Qty: 120 | Fill #1

## 2018-09-01 ENCOUNTER — Other Ambulatory Visit: Payer: Self-pay

## 2018-09-01 ENCOUNTER — Encounter: Payer: Self-pay | Admitting: Nurse Practitioner

## 2018-09-01 ENCOUNTER — Emergency Department (HOSPITAL_BASED_OUTPATIENT_CLINIC_OR_DEPARTMENT_OTHER): Payer: BLUE CROSS/BLUE SHIELD

## 2018-09-01 ENCOUNTER — Encounter (HOSPITAL_COMMUNITY): Payer: Self-pay | Admitting: Emergency Medicine

## 2018-09-01 ENCOUNTER — Emergency Department (HOSPITAL_COMMUNITY)
Admission: EM | Admit: 2018-09-01 | Discharge: 2018-09-01 | Disposition: A | Discharge status: Home / Self Care | Payer: BLUE CROSS/BLUE SHIELD | Attending: Emergency Medicine | Admitting: Emergency Medicine

## 2018-09-01 DIAGNOSIS — I10 Essential (primary) hypertension: Secondary | ICD-10-CM | POA: Diagnosis not present

## 2018-09-01 DIAGNOSIS — I2699 Other pulmonary embolism without acute cor pulmonale: Secondary | ICD-10-CM | POA: Diagnosis not present

## 2018-09-01 DIAGNOSIS — Z86718 Personal history of other venous thrombosis and embolism: Secondary | ICD-10-CM | POA: Insufficient documentation

## 2018-09-01 DIAGNOSIS — M79651 Pain in right thigh: Secondary | ICD-10-CM | POA: Diagnosis present

## 2018-09-01 DIAGNOSIS — Z86711 Personal history of pulmonary embolism: Secondary | ICD-10-CM | POA: Insufficient documentation

## 2018-09-01 DIAGNOSIS — Z87891 Personal history of nicotine dependence: Secondary | ICD-10-CM | POA: Insufficient documentation

## 2018-09-01 DIAGNOSIS — Z79899 Other long term (current) drug therapy: Secondary | ICD-10-CM | POA: Insufficient documentation

## 2018-09-01 DIAGNOSIS — Z7901 Long term (current) use of anticoagulants: Secondary | ICD-10-CM | POA: Diagnosis not present

## 2018-09-01 DIAGNOSIS — M79604 Pain in right leg: Secondary | ICD-10-CM

## 2018-09-01 NOTE — ED Notes (Signed)
Patient verbalizes understanding of discharge instructions. Opportunity for questioning and answers were provided. Armband removed by staff, pt discharged from ED.  

## 2018-09-01 NOTE — ED Triage Notes (Signed)
Pt is having right upper leg pain. States she is currently on lovenox post pregnancy 5 weeks ago with spontaneous vaginal birth. Hx of blood clots 18 years ago

## 2018-09-01 NOTE — ED Provider Notes (Signed)
[facility] EMERGENCY DEPARTMENT Provider Note   CSN: [id number] Arrival date & time: 09/01/18  1748    History   Chief Complaint Chief Complaint  Patient presents with  . Leg Pain    HPI Yvonne Ali is a 38 y.o. female.  She has a prior history of a DVT thought to be due to smoking and oral contraceptives.  She was recently had a vaginal delivery and had been on Lovenox for the pregnancy and is still on Lovenox now.  She is complaining of 1 week of right lower lateral thigh pain and was worried she may have a DVT.  She does not recall any injury.  Is not associated with any numbness weakness or shortness of breath or chest pain.  No fevers.     The history is provided by the patient.  Leg Pain  Location:  Leg Leg location:  R upper leg Pain details:    Quality:  Aching   Radiates to:  Does not radiate   Severity:  Moderate   Onset quality:  Gradual   Duration:  1 week   Timing:  Constant   Progression:  Unchanged Chronicity:  New Dislocation: no   Prior injury to area:  No Relieved by:  None tried Worsened by:  Bearing weight Ineffective treatments:  None tried Associated symptoms: decreased ROM   Associated symptoms: no back pain, no fever, no muscle weakness and no numbness     Past Medical History:  Diagnosis Date  . Acute blood loss anemia 02/06/2015  . Ectopic pregnancy   . Gestational hypertension, antepartum 02/03/2015  . Iron deficiency anemia of pregnancy 02/06/2015  . PE (pulmonary embolism)   . Postpartum care following vaginal delivery (2/2) 07/15/2013  . Pregnancy induced hypertension     Patient Active Problem List   Diagnosis Date Noted  . Essential hypertension 08/14/2018  . Postpartum care following vaginal delivery (2/10) 07/22/2018  . SVD (spontaneous vaginal delivery) 07/22/2018  . Preeclampsia complicating hypertension 06/25/2018  . Allergic rhinitis 03/09/2018  . Obesities, morbid (HCC) 01/03/2012  . History of  pulmonary embolus (PE) 01/03/2012    Past Surgical History:  Procedure Laterality Date  . BARIATRIC SURGERY     gastric sleeve  . HIP SURGERY       OB History    Gravida  4   Para  3   Term  3   Preterm      AB  1   Living  3     SAB      TAB      Ectopic  1   Multiple  0   Live Births  3            Home Medications    Prior to Admission medications   Medication Sig Start Date End Date Taking? Authorizing Provider  acetaminophen (TYLENOL) 325 MG tablet Take 2 tablets (650 mg total) by mouth every 4 (four) hours as needed (for pain scale < 4). Patient taking differently: Take 500 mg by mouth every 4 (four) hours as needed (for pain scale < 4).  07/24/18   Neta Mends, CNM  enoxaparin (LOVENOX) 80 MG/0.8ML injection Inject 0.8 mLs (80 mg total) into the skin daily for 30 days. 07/24/18 08/23/18  Neta Mends, CNM  labetalol (NORMODYNE) 200 MG tablet Take 2 tablets (400 mg total) by mouth 2 (two) times daily for 30 days. 07/24/18 08/23/18  Neta Mends, CNM  magnesium oxide (MAG-OX)  400 (241.3 Mg) MG tablet Take 1 tablet (400 mg total) by mouth daily. 07/25/18   Neta MendsPaul, Daniela C, CNM  NIFEdipine (ADALAT CC) 60 MG 24 hr tablet Take 1 tablet (60 mg total) by mouth daily for 30 days. 07/25/18 08/24/18  Neta MendsPaul, Daniela C, CNM  pantoprazole (PROTONIX) 40 MG tablet Take 40 mg by mouth.  03/04/18   [provider]  Prenatal Vit-Fe Fumarate-FA (PRENATAL MULTIVITAMIN) TABS tablet Take 1 tablet by mouth daily at 12 noon.    [provider]    Family History Family History  Problem Relation Age of Onset  . Hypertension Maternal Grandfather   . Hyperlipidemia Maternal Grandfather   . Cancer Paternal Grandmother 2969       ovarian cancer  . Diabetes Paternal Grandmother   . Cancer Mother     Social History Social History   Tobacco Use  . Smoking status: Former Smoker    Last attempt to quit: 05/12/2006    Years since quitting: 12.3  . Smokeless  tobacco: Never Used  . Tobacco comment: quit smoking 8 years ago  Substance Use Topics  . Alcohol use: Yes    Alcohol/week: 0.0 standard drinks    Comment: stopped with positive UPT  . Drug use: No     Allergies   Patient has no known allergies.   Review of Systems Review of Systems  Constitutional: Negative for fever.  HENT: Negative for sore throat.   Eyes: Negative for visual disturbance.  Respiratory: Negative for shortness of breath.   Cardiovascular: Negative for chest pain.  Gastrointestinal: Negative for abdominal pain.  Genitourinary: Negative for dysuria.  Musculoskeletal: Negative for back pain.  Skin: Negative for rash.  Neurological: Negative for headaches.     Physical Exam Updated Vital Signs BP (!) 142/78   Pulse 73   Resp 18   SpO2 99%   Physical Exam Vitals signs and nursing note reviewed.  Constitutional:      General: She is not in acute distress.    Appearance: She is well-developed. She is obese.  HENT:     Head: Normocephalic and atraumatic.  Eyes:     Conjunctiva/sclera: Conjunctivae normal.  Neck:     Musculoskeletal: Neck supple.  Cardiovascular:     Rate and Rhythm: Normal rate and regular rhythm.     Pulses: Normal pulses.     Heart sounds: No murmur.  Pulmonary:     Effort: Pulmonary effort is normal. No respiratory distress.     Breath sounds: Normal breath sounds.  Abdominal:     Palpations: Abdomen is soft.     Tenderness: There is no abdominal tenderness.  Musculoskeletal: Normal range of motion.     Comments: She has full range of motion of her right hip knee and ankle.  There does not appear to be any knee joint line tenderness or patellar tenderness.  Difficult to assess for swelling although she seems symmetric.  She seems to be tender on the lateral lower thigh just proximal to the knee.  No overlying skin changes.  Skin:    General: Skin is warm and dry.     Capillary Refill: Capillary refill takes less than 2  seconds.  Neurological:     General: No focal deficit present.     Mental Status: She is alert and oriented to person, place, and time.     GCS: GCS eye subscore is 4. GCS verbal subscore is 5. GCS motor subscore is 6.  Sensory: No sensory deficit.     Motor: No weakness.     Gait: Gait normal.      ED Treatments / Results  Labs (all labs ordered are listed, but only abnormal results are displayed) Labs Reviewed - No data to display  EKG None  Radiology Vas [place] Lower Extremity Venous (dvt) (only [facility] 7a-7p)  Result Date: 09/02/2018  Lower Venous Study Other Indications: Lateral knee pain. Personal history of PE. Comparison Study: No prior study on file for comparison Performing Technologist: Sherren Kerns RVS  Examination Guidelines: A complete evaluation includes B-mode imaging, spectral Doppler, color Doppler, and power Doppler as needed of all accessible portions of each vessel. Bilateral testing is considered an integral part of a complete examination. Limited examinations for reoccurring indications may be performed as noted.  Right Venous Findings: +---------+---------------+---------+-----------+----------+-------+          CompressibilityPhasicitySpontaneityPropertiesSummary +---------+---------------+---------+-----------+----------+-------+ CFV      Full           Yes      Yes                          +---------+---------------+---------+-----------+----------+-------+ SFJ      Full                                                 +---------+---------------+---------+-----------+----------+-------+ FV Prox  Full                                                 +---------+---------------+---------+-----------+----------+-------+ FV Mid   Full                                                 +---------+---------------+---------+-----------+----------+-------+ FV DistalFull                                                  +---------+---------------+---------+-----------+----------+-------+ PFV      Full                                                 +---------+---------------+---------+-----------+----------+-------+ POP      Full           Yes      Yes                          +---------+---------------+---------+-----------+----------+-------+ PTV      Full                                                 +---------+---------------+---------+-----------+----------+-------+ PERO     Full                                                 +---------+---------------+---------+-----------+----------+-------+  Left Venous Findings: +---+---------------+---------+-----------+----------+-------+    CompressibilityPhasicitySpontaneityPropertiesSummary +---+---------------+---------+-----------+----------+-------+ CFVFull           Yes      Yes                          +---+---------------+---------+-----------+----------+-------+    Summary: Right: There is no evidence of deep vein thrombosis in the lower extremity. No cystic structure found in the popliteal fossa. Left: No evidence of common femoral vein obstruction.  *See table(s) above for measurements and observations. Electronically signed by Coral Else MD on 09/02/2018 at 8:38:51 AM.    Final     Procedures Procedures (including critical care time)  Medications Ordered in ED Medications - No data to display   Initial Impression / Assessment and Plan / ED Course  I have reviewed the triage vital signs and the nursing notes.  Pertinent labs & imaging results that were available during my care of the patient were reviewed by me and considered in my medical decision making (see chart for details).  Clinical Course as of Sep 02 1207  Sun Sep 01, 2018  1845 Received a call from vascular tech that her duplex of her right lower extremity was negative for acute DVT.   [MB]  1977 38 year old female with prior history of DVT currently  on Lovenox here with right thigh pain for 3 days.  She has intact distal pulses sensation and motor.  No obvious cords.  Differential diagnosis includes musculoskeletal pain, arthritis, meniscal injury, DVT.   [MB]    Clinical Course User Index [MB] Terrilee Files, MD       Final Clinical Impressions(s) / ED Diagnoses   Final diagnoses:  Right leg pain    ED Discharge Orders    None       Terrilee Files, MD 09/02/18 1209

## 2018-09-01 NOTE — Discharge Instructions (Addendum)
You were seen in the emergency department for right thigh pain.  You had an ultrasound of your right lower extremity that did not show any evidence of DVT.  This is possibly musculoskeletal pain and you can try ice or heat to the area and Tylenol or ibuprofen as needed for pain.  Please follow-up with your doctor and return if any worsening symptoms.

## 2018-09-01 NOTE — Progress Notes (Signed)
VASCULAR LAB PRELIMINARY  PRELIMINARY  PRELIMINARY  PRELIMINARY  Right lower extremity venous duplex completed.    Preliminary report:  See CV proc for preliminary results.  Gave report to Dr. Abbey Chatters, [facility], RVT 09/01/2018, 6:55 PM

## 2018-09-26 ENCOUNTER — Ambulatory Visit: Payer: BLUE CROSS/BLUE SHIELD | Admitting: Nurse Practitioner

## 2018-11-14 MED FILL — LABETALOL HCL 200 MG TABS: 200 | 30 days supply | Qty: 120 | Fill #2

## 2019-06-23 ENCOUNTER — Ambulatory Visit: Payer: BLUE CROSS/BLUE SHIELD | Admitting: Nurse Practitioner

## 2019-06-30 ENCOUNTER — Ambulatory Visit: Payer: BC Managed Care – PPO | Admitting: Nurse Practitioner

## 2019-07-01 ENCOUNTER — Other Ambulatory Visit: Payer: Self-pay

## 2019-07-01 ENCOUNTER — Ambulatory Visit: Payer: BC Managed Care – PPO | Admitting: Nurse Practitioner

## 2019-07-01 ENCOUNTER — Encounter: Payer: Self-pay | Admitting: Nurse Practitioner

## 2019-07-01 VITALS — BP 132/80 | HR 95 | Temp 98.8°F | Ht 67.8 in | Wt 384.0 lb

## 2019-07-01 DIAGNOSIS — Z8669 Personal history of other diseases of the nervous system and sense organs: Secondary | ICD-10-CM | POA: Diagnosis not present

## 2019-07-01 DIAGNOSIS — I10 Essential (primary) hypertension: Secondary | ICD-10-CM

## 2019-07-01 DIAGNOSIS — D6859 Other primary thrombophilia: Secondary | ICD-10-CM | POA: Insufficient documentation

## 2019-07-01 MED ORDER — HYDROCHLOROTHIAZIDE 12.5 MG PO TABS: 12.5000 mg | ORAL_TABLET | Freq: Every day | ORAL | 2 refills | Status: DC

## 2019-07-01 MED FILL — HYDROCHLOROTHIAZIDE 12.5 MG: 12.5 | 30 days supply | Qty: 30 | Fill #0

## 2019-07-01 NOTE — Patient Instructions (Signed)

## 2019-07-01 NOTE — Progress Notes (Signed)
This visit occurred during the SARS-CoV-2 public health emergency.  Safety protocols were in place, including screening questions prior to the visit, additional usage of staff PPE, and extensive cleaning of exam room while observing appropriate contact time as indicated for disinfecting solutions.  Subjective:     Patient ID: Yvonne Ali , female    DOB: 06-13-1980 , 39 y.o.   MRN: [id number]   Chief Complaint  Patient presents with  . Hypertension    patient had bp meds during pregnancy and then was taken off and she is unsure if she still has hypertension    HPI  In November her blood pressure was elevated and she was out of town.  Blood pressure was 180/93.  She had stopped taking her labetolol.   Wt Readings from Last 3 Encounters: 07/01/19 : (!) 384 lb (174.2 kg) 08/14/18 : (!) 363 lb 3.2 oz (164.7 kg) 07/24/18 : (!) 369 lb 12 oz (167.7 kg)   Hypertension This is a recurrent problem. The current episode started more than 1 month ago. The problem is uncontrolled. Associated symptoms include headaches. Pertinent negatives include no anxiety, blurred vision, chest pain, malaise/fatigue, peripheral edema or shortness of breath. Agents associated with hypertension include NSAIDs. Risk factors for coronary artery disease include obesity and sedentary lifestyle. Past treatments include beta blockers. There is no history of kidney disease. Identifiable causes of hypertension include sleep apnea. There is no history of chronic renal disease.     Past Medical History:  Diagnosis Date  . Acute blood loss anemia 02/06/2015  . Ectopic pregnancy   . Gestational hypertension, antepartum 02/03/2015  . Iron deficiency anemia of pregnancy 02/06/2015  . PE (pulmonary embolism)   . Postpartum care following vaginal delivery (2/2) 07/15/2013  . Pregnancy induced hypertension      Family History  Problem Relation Age of Onset  . Hypertension Maternal Grandfather   . Hyperlipidemia Maternal  Grandfather   . Cancer Paternal Grandmother 54       ovarian cancer  . Diabetes Paternal Grandmother   . Cancer Mother      Current Outpatient Medications:  .  Prenatal Vit-Fe Fumarate-FA (PRENATAL MULTIVITAMIN) TABS tablet, Take 1 tablet by mouth daily at 12 noon., Disp: , Rfl:    No Known Allergies   Review of Systems  Constitutional: Negative.  Negative for malaise/fatigue.  Eyes: Negative for blurred vision.  Respiratory: Negative for cough and shortness of breath.   Cardiovascular: Negative for chest pain.  Neurological: Positive for headaches. Negative for dizziness.  Psychiatric/Behavioral: Negative.      Today's Vitals   07/01/19 1439  BP: 132/80  Pulse: 95  Temp: 98.8 F (37.1 C)  TempSrc: Oral  Weight: (!) 384 lb (174.2 kg)  Height: 5' 7.8" (1.722 m)  PainSc: 0-No pain   Body mass index is 58.73 kg/m.   Objective:  Physical Exam Vitals reviewed.  Constitutional:      General: She is not in acute distress.    Appearance: Normal appearance. She is obese.  Eyes:     Pupils: Pupils are equal, round, and reactive to light.  Cardiovascular:     Rate and Rhythm: Normal rate and regular rhythm.     Pulses: Normal pulses.     Heart sounds: Normal heart sounds. No murmur.  Pulmonary:     Effort: Pulmonary effort is normal. No respiratory distress.     Breath sounds: Normal breath sounds.  Musculoskeletal:     Right lower leg: No edema.  Left lower leg: No edema.  Skin:    Capillary Refill: Capillary refill takes less than 2 seconds.  Neurological:     General: No focal deficit present.     Mental Status: She is alert and oriented to person, place, and time.  Psychiatric:        Mood and Affect: Mood normal.        Behavior: Behavior normal.        Thought Content: Thought content normal.        Judgment: Judgment normal.         Assessment And Plan:     1. Essential hypertension  Chronic, fair control  I would like to start her on HCTZ  especially since she stopped taking her labetolol  We discussed discontinuing medications suddenly and the risk of blood pressure spikes.   Advised to increase intake of potassium rich foods and water intake - hydrochlorothiazide (HYDRODIURIL) 12.5 MG tablet; Take 1 tablet (12.5 mg total) by mouth daily.  Dispense: 30 tablet; Refill: 2 - BMP8+eGFR  2. History of sleep apnea  She is not using a cpap machine  She may need a referral for another sleep study this would help control her blood pressure and weight   Minette Brine, FNP    THE PATIENT IS ENCOURAGED TO PRACTICE SOCIAL DISTANCING DUE TO THE COVID-19 PANDEMIC.

## 2019-07-02 ENCOUNTER — Other Ambulatory Visit: Payer: Self-pay

## 2019-07-04 LAB — BMP8+EGFR
BUN/Creatinine Ratio: 12 (ref 9–23)
BUN: 13 mg/dL (ref 6–20)
CO2: 22 mmol/L (ref 20–29)
Calcium: 9.1 mg/dL (ref 8.7–10.2)
Chloride: 105 mmol/L (ref 96–106)
Creatinine, Ser: 1.11 mg/dL — ABNORMAL HIGH (ref 0.57–1.00)
GFR calc Af Amer: 74 mL/min/{1.73_m2} (ref >59)
GFR calc non Af Amer: 64 mL/min/{1.73_m2} (ref >59)
Glucose: 91 mg/dL (ref 65–99)
Potassium: 4.3 mmol/L (ref 3.5–5.2)
Sodium: 140 mmol/L (ref 134–144)

## 2019-07-09 ENCOUNTER — Encounter: Payer: Self-pay | Admitting: Nurse Practitioner

## 2019-07-31 ENCOUNTER — Other Ambulatory Visit: Payer: Self-pay | Admitting: Nurse Practitioner

## 2019-07-31 DIAGNOSIS — Z8669 Personal history of other diseases of the nervous system and sense organs: Secondary | ICD-10-CM

## 2019-07-31 NOTE — Progress Notes (Signed)
I will refer her to a sleep provider

## 2019-08-06 ENCOUNTER — Encounter: Payer: Self-pay | Admitting: Nurse Practitioner

## 2019-08-12 ENCOUNTER — Institutional Professional Consult (permissible substitution): Payer: BC Managed Care – PPO | Admitting: Neurology

## 2019-08-12 ENCOUNTER — Telehealth: Payer: Self-pay | Admitting: Neurology

## 2019-08-12 NOTE — Telephone Encounter (Signed)
Patient has been called twice (3/1 & 3/2) regarding rescheduling 3/2 appointment due to MD out of office. LVM both times. Requested patient call back to reschedule.

## 2019-09-25 ENCOUNTER — Encounter: Payer: Self-pay | Admitting: Nurse Practitioner

## 2020-09-14 ENCOUNTER — Encounter (HOSPITAL_COMMUNITY): Payer: Self-pay

## 2020-09-14 ENCOUNTER — Other Ambulatory Visit: Payer: Self-pay

## 2020-09-14 ENCOUNTER — Other Ambulatory Visit: Payer: Self-pay | Admitting: Nurse Practitioner

## 2020-09-14 ENCOUNTER — Other Ambulatory Visit (HOSPITAL_COMMUNITY): Payer: Self-pay

## 2020-09-14 ENCOUNTER — Ambulatory Visit (HOSPITAL_COMMUNITY)
Admission: EM | Admit: 2020-09-14 | Discharge: 2020-09-14 | Disposition: A | Discharge status: Home / Self Care | Payer: BC Managed Care – PPO | Attending: Family Medicine | Admitting: Family Medicine

## 2020-09-14 DIAGNOSIS — R1013 Epigastric pain: Secondary | ICD-10-CM | POA: Diagnosis not present

## 2020-09-14 DIAGNOSIS — M79661 Pain in right lower leg: Secondary | ICD-10-CM | POA: Insufficient documentation

## 2020-09-14 DIAGNOSIS — I10 Essential (primary) hypertension: Secondary | ICD-10-CM | POA: Insufficient documentation

## 2020-09-14 DIAGNOSIS — Z3202 Encounter for pregnancy test, result negative: Secondary | ICD-10-CM | POA: Diagnosis not present

## 2020-09-14 LAB — POCT URINALYSIS DIPSTICK, ED / UC
Bilirubin Urine: NEGATIVE
Glucose, UA: NEGATIVE mg/dL
Hgb urine dipstick: NEGATIVE
Ketones, ur: NEGATIVE mg/dL
Nitrite: NEGATIVE
Protein, ur: NEGATIVE mg/dL
Specific Gravity, Urine: >=1.03 (ref 1.005–1.030)
Urobilinogen, UA: 0.2 mg/dL (ref 0.0–1.0)
pH: 5.5 (ref 5.0–8.0)

## 2020-09-14 LAB — POC URINE PREG, ED: Preg Test, Ur: NEGATIVE

## 2020-09-14 MED ORDER — HYDROCHLOROTHIAZIDE 12.5 MG PO TABS
12.5000 mg | ORAL_TABLET | Freq: Every day | ORAL | 2 refills | Status: DC
  Filled 2020-09-14: qty 30, 30d supply, fill #0

## 2020-09-14 NOTE — Discharge Instructions (Signed)
You have been seen today for abdominal pain. Your evaluation was not suggestive of any emergent condition requiring medical intervention at this time. However, some abdominal problems make take more time to appear. Therefore, it is very important for you to pay attention to any new symptoms or worsening of your current condition.  Please return here or to the Emergency Department immediately should you begin to feel worse in any way or have any of the following symptoms: increasing or different abdominal pain, persistent vomiting, inability to drink fluids, fevers, or shaking chills.   You have had labs (blood work) drawn today. We will call you with any significant abnormalities or if there is need to begin or change treatment or pursue further follow up.  You may also review your test results online through MyChart. If you do not have a MyChart account, instructions to sign up should be on your discharge paperwork.  Your blood pressure was noted to be elevated during your visit today. If you are currently taking medication for high blood pressure, please ensure you are taking this as directed. If you do not have a history of high blood pressure and your blood pressure remains persistently elevated, you may need to begin taking a medication at some point. You may return here within the next few days to recheck if unable to see your primary care provider or if you do not have a one.  BP (!) 170/116 (BP Location: Left Arm) Comment: have not taken BP meds in a few weeks  Pulse 94   Temp 99.1 F (37.3 C) (Oral)   Resp 18   LMP 08/22/2020   SpO2 97%   BP Readings from Last 3 Encounters:  09/14/20 (!) 170/116  07/01/19 132/80  09/01/18 (!) 142/78

## 2020-09-14 NOTE — ED Provider Notes (Signed)
[facility]   [id number] 09/14/20 Arrival Time: 1330  ASSESSMENT & PLAN:  1. Epigastric pain   2. Pain in right lower leg   3. Elevated blood pressure reading in office with diagnosis of hypertension     Overall benign abdominal exam. No indications for urgent abdominal/pelvic imaging at this time. She is comfortable with home observation. Unable to obtain blood work despite several attempts. No signs of DVT. Discussed obtaining U/S today. She is comfortable watching for s/s of DVT over the next few days. Elevated BP noted and discussed.   Discharge Instructions      You have been seen today for abdominal pain. Your evaluation was not suggestive of any emergent condition requiring medical intervention at this time. However, some abdominal problems make take more time to appear. Therefore, it is very important for you to pay attention to any new symptoms or worsening of your current condition.  Please return here or to the Emergency Department immediately should you begin to feel worse in any way or have any of the following symptoms: increasing or different abdominal pain, persistent vomiting, inability to drink fluids, fevers, or shaking chills.   You have had labs (blood work) drawn today. We will call you with any significant abnormalities or if there is need to begin or change treatment or pursue further follow up.  You may also review your test results online through MyChart. If you do not have a MyChart account, instructions to sign up should be on your discharge paperwork.  Your blood pressure was noted to be elevated during your visit today. If you are currently taking medication for high blood pressure, please ensure you are taking this as directed. If you do not have a history of high blood pressure and your blood pressure remains persistently elevated, you may need to begin taking a medication at some point. You may return here within the next few days to recheck if  unable to see your primary care provider or if you do not have a one.  BP (!) 170/116 (BP Location: Left Arm) Comment: have not taken BP meds in a few weeks  Pulse 94   Temp 99.1 F (37.3 C) (Oral)   Resp 18   LMP 08/22/2020   SpO2 97%   BP Readings from Last 3 Encounters:  09/14/20 (!) 170/116  07/01/19 132/80  09/01/18 (!) 142/78      Follow-up Information    [facility] EMERGENCY DEPARTMENT.   Specialty: Emergency Medicine Why: If symptoms worsen in any way. Contact information: [address] [id number] mc [place] [address] [phone]               Reviewed expectations re: course of current medical issues. Questions answered. Outlined signs and symptoms indicating need for more acute intervention. Patient verbalized understanding. After Visit Summary given.   SUBJECTIVE: History from: patient. Yvonne Ali is a 40 y.o. female who reports vague lower non-radiating abdominal discomfort/pain; first noted yesterday; stable; fairly persistent; does not wake her at night. Afebrile. Normal PO intake without n/v/d. Normal bowel/bladder habits. No OTC tx. Also reports mild RLE anterior pain that she feels in calf now; over past week. No calf swelling noted. H/O DVT. Ambulatory without difficulty. No CP/SOB.  Patient's last menstrual period was 08/22/2020.   Past Surgical History:  Procedure Laterality Date  . BARIATRIC SURGERY     gastric sleeve  . HIP SURGERY       OBJECTIVE:  Vitals:  09/14/20 1429  BP: (!) 170/116  Pulse: 94  Resp: 18  Temp: 99.1 F (37.3 C)  TempSrc: Oral  SpO2: 97%    General appearance: alert, oriented, no acute distress; obese HEENT: [place]; AT Lungs: unlabored respirations Abdomen: soft; without distention; mild  and poorly localized tenderness to palpation over lower abdomen; without appreciable masses or organomegaly; without guarding or rebound tenderness Back: without reported  CVA tenderness; FROM at waist Extremities: without LE edema; symmetrical; without gross deformities; no calf swelling when compared to left; no overlying erythema Skin: warm and dry Neurologic: normal gait Psychological: alert and cooperative; normal mood and affect  Labs: Results for orders placed or performed during the hospital encounter of 09/14/20  POC urine pregnancy  Result Value Ref Range   Preg Test, Ur NEGATIVE NEGATIVE  POC Urinalysis dipstick  Result Value Ref Range   Glucose, UA NEGATIVE NEGATIVE mg/dL   Bilirubin Urine NEGATIVE NEGATIVE   Ketones, ur NEGATIVE NEGATIVE mg/dL   Specific Gravity, Urine >=1.030 1.005 - 1.030   Hgb urine dipstick NEGATIVE NEGATIVE   pH 5.5 5.0 - 8.0   Protein, ur NEGATIVE NEGATIVE mg/dL   Urobilinogen, UA 0.2 0.0 - 1.0 mg/dL   Nitrite NEGATIVE NEGATIVE   Leukocytes,Ua TRACE (A) NEGATIVE   Labs Reviewed  POCT URINALYSIS DIPSTICK, ED / UC - Abnormal; Notable for the following components:      Result Value   Leukocytes,Ua TRACE (*)    All other components within normal limits  URINE CULTURE  CBC WITH DIFFERENTIAL/PLATELET  COMPREHENSIVE METABOLIC PANEL  LIPASE, BLOOD  POC URINE PREG, ED     No Known Allergies                                             Past Medical History:  Diagnosis Date  . Acute blood loss anemia 02/06/2015  . Ectopic pregnancy   . Gestational hypertension, antepartum 02/03/2015  . Iron deficiency anemia of pregnancy 02/06/2015  . PE (pulmonary embolism)   . Postpartum care following vaginal delivery (2/2) 07/15/2013  . Pregnancy induced hypertension     Social History   Socioeconomic History  . Marital status: Married    Spouse name: Not on file  . Number of children: Not on file  . Years of education: Not on file  . Highest education level: Not on file  Occupational History  . Not on file  Tobacco Use  . Smoking status: Former Smoker    Quit date: 05/12/2006    Years since quitting: 14.3  .  Smokeless tobacco: Never Used  . Tobacco comment: quit smoking 8 years ago  Vaping Use  . Vaping Use: Never used  Substance and Sexual Activity  . Alcohol use: Yes    Alcohol/week: 0.0 standard drinks    Comment: stopped with positive UPT  . Drug use: No  . Sexual activity: Yes    Partners: Male    Birth control/protection: None  Other Topics Concern  . Not on file  Social History Narrative  . Not on file   Social Determinants of Health   Financial Resource Strain: Not on file  Food Insecurity: Not on file  Transportation Needs: Not on file  Physical Activity: Not on file  Stress: Not on file  Social Connections: Not on file  Intimate Partner Violence: Not on file    Family History  Problem Relation Age of Onset  . Hypertension Maternal Grandfather   . Hyperlipidemia Maternal Grandfather   . Cancer Paternal Grandmother 7       ovarian cancer  . Diabetes Paternal Grandmother   . Cancer Mother      Mardella Layman, MD 09/15/20 1414

## 2020-09-14 NOTE — ED Triage Notes (Signed)
Pt presents with suprapubic abdominal pain since yesterday.  Pt also complains of right side calf pain X 1 week .

## 2020-09-16 LAB — URINE CULTURE

## 2021-01-31 ENCOUNTER — Encounter: Payer: BC Managed Care – PPO | Admitting: Nurse Practitioner

## 2021-02-23 ENCOUNTER — Ambulatory Visit (INDEPENDENT_AMBULATORY_CARE_PROVIDER_SITE_OTHER): Payer: BC Managed Care – PPO | Admitting: Nurse Practitioner

## 2021-02-23 ENCOUNTER — Other Ambulatory Visit (HOSPITAL_COMMUNITY): Payer: Self-pay

## 2021-02-23 ENCOUNTER — Other Ambulatory Visit: Payer: Self-pay

## 2021-02-23 ENCOUNTER — Encounter: Payer: Self-pay | Admitting: Nurse Practitioner

## 2021-02-23 VITALS — BP 132/86 | HR 98 | Temp 98.0°F | Ht 70.0 in | Wt 357.2 lb

## 2021-02-23 DIAGNOSIS — I10 Essential (primary) hypertension: Secondary | ICD-10-CM | POA: Diagnosis not present

## 2021-02-23 DIAGNOSIS — Z0000 Encounter for general adult medical examination without abnormal findings: Secondary | ICD-10-CM | POA: Diagnosis not present

## 2021-02-23 DIAGNOSIS — Z6841 Body Mass Index (BMI) 40.0 and over, adult: Secondary | ICD-10-CM

## 2021-02-23 LAB — POCT URINALYSIS DIPSTICK
Bilirubin, UA: NEGATIVE
Blood, UA: NEGATIVE
Glucose, UA: NEGATIVE
Leukocytes, UA: NEGATIVE
Nitrite, UA: NEGATIVE
Protein, UA: NEGATIVE
Spec Grav, UA: <=1.005 — AB (ref 1.010–1.025)
Urobilinogen, UA: 0.2 E.U./dL
pH, UA: 5.5 (ref 5.0–8.0)

## 2021-02-23 MED ORDER — HYDROCHLOROTHIAZIDE 12.5 MG PO TABS
12.5000 mg | ORAL_TABLET | Freq: Every day | ORAL | 2 refills | Status: DC
  Filled 2021-02-23 – 2021-12-12 (×2): qty 30, 30d supply, fill #0
  Filled 2022-02-09: qty 30, 30d supply, fill #1

## 2021-02-23 NOTE — Patient Instructions (Signed)
Health Maintenance, Female Adopting a healthy lifestyle and getting preventive care are important in promoting health and wellness. Ask your health care provider about: The right schedule for you to have regular tests and exams. Things you can do on your own to prevent diseases and keep yourself healthy. What should I know about diet, weight, and exercise? Eat a healthy diet  Eat a diet that includes plenty of vegetables, fruits, low-fat dairy products, and lean protein. Do not eat a lot of foods that are high in solid fats, added sugars, or sodium. Maintain a healthy weight Body mass index (BMI) is used to identify weight problems. It estimates body fat based on height and weight. Your health care provider can help determine your BMI and help you achieve or maintain a healthy weight. Get regular exercise Get regular exercise. This is one of the most important things you can do for your health. Most adults should: Exercise for at least 150 minutes each week. The exercise should increase your heart rate and make you sweat (moderate-intensity exercise). Do strengthening exercises at least twice a week. This is in addition to the moderate-intensity exercise. Spend less time sitting. Even light physical activity can be beneficial. Watch cholesterol and blood lipids Have your blood tested for lipids and cholesterol at 38 years of age, then have this test every 5 years. Have your cholesterol levels checked more often if: Your lipid or cholesterol levels are high. You are older than 38 years of age. You are at high risk for heart disease. What should I know about cancer screening? Depending on your health history and family history, you may need to have cancer screening at various ages. This may include screening for: Breast cancer. Cervical cancer. Colorectal cancer. Skin cancer. Lung cancer. What should I know about heart disease, diabetes, and high blood pressure? Blood pressure and heart  disease High blood pressure causes heart disease and increases the risk of stroke. This is more likely to develop in people who have high blood pressure readings, are of African descent, or are overweight. Have your blood pressure checked: Every 3-5 years if you are 18-39 years of age. Every year if you are 40 years old or older. Diabetes Have regular diabetes screenings. This checks your fasting blood sugar level. Have the screening done: Once every three years after age 40 if you are at a normal weight and have a low risk for diabetes. More often and at a younger age if you are overweight or have a high risk for diabetes. What should I know about preventing infection? Hepatitis B If you have a higher risk for hepatitis B, you should be screened for this virus. Talk with your health care provider to find out if you are at risk for hepatitis B infection. Hepatitis C Testing is recommended for: Everyone born from 1945 through 1965. Anyone with known risk factors for hepatitis C. Sexually transmitted infections (STIs) Get screened for STIs, including gonorrhea and chlamydia, if: You are sexually active and are younger than 38 years of age. You are older than 38 years of age and your health care provider tells you that you are at risk for this type of infection. Your sexual activity has changed since you were last screened, and you are at increased risk for chlamydia or gonorrhea. Ask your health care provider if you are at risk. Ask your health care provider about whether you are at high risk for HIV. Your health care provider may recommend a prescription medicine   to help prevent HIV infection. If you choose to take medicine to prevent HIV, you should first get tested for HIV. You should then be tested every 3 months for as long as you are taking the medicine. Pregnancy If you are about to stop having your period (premenopausal) and you may become pregnant, seek counseling before you get  pregnant. Take 400 to 800 micrograms (mcg) of folic acid every day if you become pregnant. Ask for birth control (contraception) if you want to prevent pregnancy. Osteoporosis and menopause Osteoporosis is a disease in which the bones lose minerals and strength with aging. This can result in bone fractures. If you are 65 years old or older, or if you are at risk for osteoporosis and fractures, ask your health care provider if you should: Be screened for bone loss. Take a calcium or vitamin D supplement to lower your risk of fractures. Be given hormone replacement therapy (HRT) to treat symptoms of menopause. Follow these instructions at home: Lifestyle Do not use any products that contain nicotine or tobacco, such as cigarettes, e-cigarettes, and chewing tobacco. If you need help quitting, ask your health care provider. Do not use street drugs. Do not share needles. Ask your health care provider for help if you need support or information about quitting drugs. Alcohol use Do not drink alcohol if: Your health care provider tells you not to drink. You are pregnant, may be pregnant, or are planning to become pregnant. If you drink alcohol: Limit how much you use to 0-1 drink a day. Limit intake if you are breastfeeding. Be aware of how much alcohol is in your drink. In the [place] one drink equals one 12 oz bottle of beer (355 mL), one 5 oz glass of wine (148 mL), or one 1 oz glass of hard liquor (44 mL). General instructions Schedule regular health, dental, and eye exams. Stay current with your vaccines. Tell your health care provider if: You often feel depressed. You have ever been abused or do not feel safe at home. Summary Adopting a healthy lifestyle and getting preventive care are important in promoting health and wellness. Follow your health care provider's instructions about healthy diet, exercising, and getting tested or screened for diseases. Follow your health care provider's  instructions on monitoring your cholesterol and blood pressure. This information is not intended to replace advice given to you by your health care provider. Make sure you discuss any questions you have with your health care provider. Document Revised: 08/06/2020 Document Reviewed: 05/22/2018 Elsevier Patient Education  2022 Elsevier Inc.  

## 2021-02-23 NOTE — Progress Notes (Signed)
I,Katawbba Wiggins,acting as a Education administrator for [employer], NP.,have documented all relevant documentation on the behalf of [employer], NP,as directed by  Bary Castilla, NP while in the presence of Bary Castilla, NP.  This visit occurred during the SARS-CoV-2 public health emergency.  Safety protocols were in place, including screening questions prior to the visit, additional usage of staff PPE, and extensive cleaning of exam room while observing appropriate contact time as indicated for disinfecting solutions.  Subjective:     Patient ID: Yvonne Ali , female    DOB: 1980-06-20 , 40 y.o.   MRN: [id number]   Chief Complaint  Patient presents with   Annual Exam     HPI  The patient is here today for a physical. The patient is followed by Dr. Mardelle Matte for her GYN care, last pap was October 2021.  Diet: "clean eating." She is eating a lot of chicken and fish and a lot of green vegetables and fruits  Exercise: She is not exercising LMP: 01/31/21  Sexually active     Past Medical History:  Diagnosis Date   Acute blood loss anemia 02/06/2015   Ectopic pregnancy    Gestational hypertension, antepartum 02/03/2015   Iron deficiency anemia of pregnancy 02/06/2015   PE (pulmonary embolism)    Postpartum care following vaginal delivery (2/2) 07/15/2013   Pregnancy induced hypertension      Family History  Problem Relation Age of Onset   Hypertension Maternal Grandfather    Hyperlipidemia Maternal Grandfather    Cancer Paternal Grandmother 30       ovarian cancer   Diabetes Paternal Grandmother    Cancer Mother      Current Outpatient Medications:    hydrochlorothiazide (HYDRODIURIL) 12.5 MG tablet, Take 1 tablet (12.5 mg total) by mouth daily., Disp: 30 tablet, Rfl: 2   Prenatal Vit-Fe Fumarate-FA (PRENATAL MULTIVITAMIN) TABS tablet, Take 1 tablet by mouth daily at 12 noon., Disp: , Rfl:    No Known Allergies    The patient states she uses none for birth  control. Last LMP was Patient's last menstrual period was 02/04/2021.. Negative for Dysmenorrhea. Negative for: breast discharge, breast lump(s), breast pain and breast self exam. Associated symptoms include abnormal vaginal bleeding. Pertinent negatives include abnormal bleeding (hematology), anxiety, decreased libido, depression, difficulty falling sleep, dyspareunia, history of infertility, nocturia, sexual dysfunction, sleep disturbances, urinary incontinence, urinary urgency, vaginal discharge and vaginal itching. Diet regular.The patient states her exercise level is    . The patient's tobacco use is:  Social History   Tobacco Use  Smoking Status Former   Types: Cigarettes   Quit date: 05/12/2006   Years since quitting: 14.7  Smokeless Tobacco Never  Tobacco Comments   quit smoking 8 years ago  . She has been exposed to passive smoke. The patient's alcohol use is:  Social History   Substance and Sexual Activity  Alcohol Use Yes   Alcohol/week: 0.0 standard drinks   Comment: stopped with positive UPT  . Additional information: Last pap 2021, next one scheduled for 2024.    Review of Systems  Constitutional:  Negative for chills, fatigue and fever.  HENT:  Negative for congestion, rhinorrhea and sinus pressure.   Respiratory:  Negative for cough, shortness of breath and wheezing.   Cardiovascular:  Negative for chest pain and palpitations.  Gastrointestinal:  Negative for constipation and diarrhea.  Endocrine: Negative for polydipsia, polyphagia and polyuria.  Musculoskeletal:  Negative for arthralgias and myalgias.  Neurological:  Negative for dizziness  and headaches.    Today's Vitals   02/23/21 0939  BP: 132/86  Pulse: 98  Temp: 98 F (36.7 C)  TempSrc: Oral  Weight: (!) 357 lb 3.2 oz (162 kg)  Height: 5' 10"  (1.778 m)   Body mass index is 51.25 kg/m.  Wt Readings from Last 3 Encounters:  02/23/21 (!) 357 lb 3.2 oz (162 kg)  07/01/19 (!) 384 lb (174.2 kg)  08/14/18  (!) 363 lb 3.2 oz (164.7 kg)    BP Readings from Last 3 Encounters:  02/23/21 132/86  09/14/20 (!) 170/116  07/01/19 132/80    Objective:  Physical Exam Vitals and nursing note reviewed.  Constitutional:      Appearance: Normal appearance. She is obese.  HENT:     Head: Normocephalic and atraumatic.     Right Ear: Tympanic membrane, ear canal and external ear normal.     Left Ear: Tympanic membrane, ear canal and external ear normal.     Nose: Nose normal.     Mouth/Throat:     Mouth: Mucous membranes are moist.     Pharynx: Oropharynx is clear.  Eyes:     Extraocular Movements: Extraocular movements intact.     Conjunctiva/sclera: Conjunctivae normal.     Pupils: Pupils are equal, round, and reactive to light.  Cardiovascular:     Rate and Rhythm: Normal rate and regular rhythm.     Pulses: Normal pulses.     Heart sounds: Normal heart sounds. No murmur heard. Pulmonary:     Effort: Pulmonary effort is normal. No respiratory distress.     Breath sounds: Normal breath sounds. No stridor. No wheezing or rales.  Abdominal:     General: Abdomen is flat. Bowel sounds are normal.     Palpations: Abdomen is soft.     Tenderness: There is no abdominal tenderness. There is no guarding.  Genitourinary:    Comments: deferred Musculoskeletal:        General: Normal range of motion.     Cervical back: Normal range of motion and neck supple.  Skin:    General: Skin is warm and dry.     Capillary Refill: Capillary refill takes less than 2 seconds.  Neurological:     General: No focal deficit present.     Mental Status: She is alert and oriented to person, place, and time.  Psychiatric:        Mood and Affect: Mood normal.        Behavior: Behavior normal.        Assessment And Plan:     1. Routine general medical examination at a health care facility --Patient is here for their annual physical exam and we discussed any changes to medication and medical history.  -Behavior  modification was discussed as well as diet and exercise history  -Patient will continue to exercise regularly and modify their diet.  -Recommendation for yearly physical annuals, immunization and screenings including mammogram and colonoscopy were discussed with the patient.  -Recommended intake of multivitamin, vitamin D and calcium.  -Individualized advise was given to the patient pertaining to their own health history in regards to diet, exercise, medical condition and referrals.  - Hemoglobin A1c - CMP14+EGFR - Lipid panel - CBC - Hepatitis C antibody  2. Essential hypertension -Limit the intake of processed foods and salt intake. You should increase your intake of green vegetables and fruits. Limit the use of alcohol. Limit fast foods and fried foods. Avoid high fatty saturated and trans  fat foods. Keep yourself hydrated with drinking water. Avoid red meats. Eat lean meats instead. Exercise for atleast 30-45 min for atleast 4-5 times a week.  -Chronic stable, continue meds  Today BP was 138/88  - EKG 12-Lead - POCT Urinalysis Dipstick (81002) - Microalbumin, urine - CMP14+EGFR - CBC - hydrochlorothiazide (HYDRODIURIL) 12.5 MG tablet; Take 1 tablet (12.5 mg total) by mouth daily.  Dispense: 30 tablet; Refill: 2  3. Class 3 severe obesity due to excess calories without serious comorbidity with body mass index (BMI) of 50.0 to 59.9 in adult [facility]) Advised patient on a healthy diet including avoiding fast food and red meats. Increase the intake of lean meats including grilled chicken and [place].  Drink a lot of water. Decrease intake of fatty foods. Exercise for 30-45 min. 4-5 a week to decrease the risk of cardiac event.   The patient was encouraged to call or send a message through [facility] for any questions or concerns.   Follow up: if symptoms persist or do not get better.   Staying healthy and adopting a healthy lifestyle for your overall health is important. You should eat 7 or more  servings of fruits and vegetables per day. You should drink plenty of water to keep yourself hydrated and your kidneys healthy. This includes about 65-80+ fluid ounces of water. Limit your intake of animal fats especially for elevated cholesterol. Avoid highly processed food and limit your salt intake if you have hypertension. Avoid foods high in saturated/Trans fats. Along with a healthy diet it is also very important to maintain time for yourself to maintain a healthy mental health with low stress levels. You should get atleast 150 min of moderate intensity exercise weekly for a healthy heart. Along with eating right and exercising, aim for at least 7-9 hours of sleep daily.  Eat more whole grains which includes barley, wheat berries, oats, brown rice and whole wheat pasta. Use healthy plant oils which include olive, soy, corn, sunflower and peanut. Limit your caffeine and sugary drinks. Limit your intake of fast foods. Limit milk and dairy products to one or two daily servings.   Side effects and appropriate use of all the medication(s) were discussed with the patient today. Patient advised to use the medication(s) as directed by their healthcare provider. The patient was encouraged to read, review, and understand all associated package inserts and contact our office with any questions or concerns. The patient accepts the risks of the treatment plan and had an opportunity to ask questions.   Patient was given opportunity to ask questions. Patient verbalized understanding of the plan and was able to repeat key elements of the plan. All questions were answered to their satisfaction.  Raman Rannie Craney, DNP   I, Raman Bentleigh Waren have reviewed all documentation for this visit. The documentation on 02/23/21 for the exam, diagnosis, procedures, and orders are all accurate and complete.    am, diagnosis, procedures, and orders are all accurate and complete.  THE PATIENT IS ENCOURAGED TO PRACTICE SOCIAL DISTANCING DUE  TO THE COVID-19 PANDEMIC.

## 2021-02-24 LAB — CMP14+EGFR
ALT: 15 IU/L (ref 0–32)
AST: 19 IU/L (ref 0–40)
Albumin/Globulin Ratio: 1.1 — ABNORMAL LOW (ref 1.2–2.2)
Albumin: 4 g/dL (ref 3.8–4.8)
Alkaline Phosphatase: 68 IU/L (ref 44–121)
BUN/Creatinine Ratio: 10 (ref 9–23)
BUN: 10 mg/dL (ref 6–20)
Bilirubin Total: 0.4 mg/dL (ref 0.0–1.2)
CO2: 22 mmol/L (ref 20–29)
Calcium: 9.6 mg/dL (ref 8.7–10.2)
Chloride: 101 mmol/L (ref 96–106)
Creatinine, Ser: 1.05 mg/dL — ABNORMAL HIGH (ref 0.57–1.00)
Globulin, Total: 3.5 g/dL (ref 1.5–4.5)
Glucose: 89 mg/dL (ref 65–99)
Potassium: 4.4 mmol/L (ref 3.5–5.2)
Sodium: 136 mmol/L (ref 134–144)
Total Protein: 7.5 g/dL (ref 6.0–8.5)
eGFR: 70 mL/min/{1.73_m2} (ref >59)

## 2021-02-24 LAB — LIPID PANEL
Chol/HDL Ratio: 3.1 ratio (ref 0.0–4.4)
Cholesterol, Total: 173 mg/dL (ref 100–199)
HDL: 55 mg/dL (ref >39)
LDL Chol Calc (NIH): 106 mg/dL — ABNORMAL HIGH (ref 0–99)
Triglycerides: 62 mg/dL (ref 0–149)
VLDL Cholesterol Cal: 12 mg/dL (ref 5–40)

## 2021-02-24 LAB — CBC
Hematocrit: 35.8 % (ref 34.0–46.6)
Hemoglobin: 11.2 g/dL (ref 11.1–15.9)
MCH: 24.4 pg — ABNORMAL LOW (ref 26.6–33.0)
MCHC: 31.3 g/dL — ABNORMAL LOW (ref 31.5–35.7)
MCV: 78 fL — ABNORMAL LOW (ref 79–97)
Platelets: 328 10*3/uL (ref 150–450)
RBC: 4.59 x10E6/uL (ref 3.77–5.28)
RDW: 14.6 % (ref 11.7–15.4)
WBC: 4.5 10*3/uL (ref 3.4–10.8)

## 2021-02-24 LAB — HEMOGLOBIN A1C
Est. average glucose Bld gHb Est-mCnc: 120 mg/dL
Hgb A1c MFr Bld: 5.8 % — ABNORMAL HIGH (ref 4.8–5.6)

## 2021-02-24 LAB — HEPATITIS C ANTIBODY: Hep C Virus Ab: <0.1 s/co ratio (ref 0.0–0.9)

## 2021-02-24 LAB — MICROALBUMIN, URINE: Microalbumin, Urine: 3.1 ug/mL

## 2021-03-03 ENCOUNTER — Other Ambulatory Visit (HOSPITAL_COMMUNITY): Payer: Self-pay

## 2021-06-01 LAB — HM PAP SMEAR

## 2021-09-26 ENCOUNTER — Encounter (HOSPITAL_COMMUNITY): Payer: Self-pay | Admitting: *Deleted

## 2021-09-26 ENCOUNTER — Other Ambulatory Visit: Payer: Self-pay

## 2021-09-26 ENCOUNTER — Ambulatory Visit (HOSPITAL_COMMUNITY)
Admission: EM | Admit: 2021-09-26 | Discharge: 2021-09-26 | Disposition: A | Discharge status: Home / Self Care | Payer: BC Managed Care – PPO | Attending: Student | Admitting: Student

## 2021-09-26 DIAGNOSIS — K429 Umbilical hernia without obstruction or gangrene: Secondary | ICD-10-CM | POA: Diagnosis not present

## 2021-09-26 LAB — POCT URINALYSIS DIPSTICK, ED / UC
Glucose, UA: NEGATIVE mg/dL
Ketones, ur: NEGATIVE mg/dL
Leukocytes,Ua: NEGATIVE
Nitrite: NEGATIVE
Protein, ur: >=300 mg/dL — AB
Specific Gravity, Urine: >=1.03 (ref 1.005–1.030)
Urobilinogen, UA: 2 mg/dL — ABNORMAL HIGH (ref 0.0–1.0)
pH: 5.5 (ref 5.0–8.0)

## 2021-09-26 LAB — POC URINE PREG, ED: Preg Test, Ur: NEGATIVE

## 2021-09-26 NOTE — ED Triage Notes (Signed)
Pt reports after picking up a heavy suitcase on a cruse she had ABD pain and hernia. Pt reports she was treated on [place] with IV fluids and IV pain meds.The hernia was reduced on the cruise . Pt has been pain free until this past SAt . Pt has ABD pain,diarrhea and back pain. ?

## 2021-09-26 NOTE — Progress Notes (Signed)
?I,Victoria T Hamilton,acting as a scribe for Minette Brine, FNP.,have documented all relevant documentation on the behalf of Minette Brine, FNP,as directed by  Minette Brine, FNP while in the presence of Minette Brine, [place].  ?This visit occurred during the SARS-CoV-2 public health emergency.  Safety protocols were in place, including screening questions prior to the visit, additional usage of staff PPE, and extensive cleaning of exam room while observing appropriate contact time as indicated for disinfecting solutions. ? ?Subjective:  ?  ? Patient ID: Yvonne Ali , female    DOB: 1981/04/22 , 41 y.o.   MRN: [id number] ? ? ?Chief Complaint  ?Patient presents with  ? Abdominal Pain  ? ? ?HPI ? ?Pt presents today for an umbilical hernia, she reports having surgery for this in 2011. Recently she went out of town, on a cruise the hernia protruded, after she lifted a heavy suitcase. It was later in the day she began having pain in her stomach occurred on Sunday April 9th. On the cruise, she had to get IV, Fentanyl, and Zofran for excruciating pain.  ? ?She reports this is the first time this has happened since surgery in 2011 while in [place]. ?She also did go to urgent care yesterday for weakness & back pain.   ?  ? ?Past Medical History:  ?Diagnosis Date  ? Acute blood loss anemia 02/06/2015  ? Ectopic pregnancy   ? Gestational hypertension, antepartum 02/03/2015  ? Iron deficiency anemia of pregnancy 02/06/2015  ? PE (pulmonary embolism)   ? Postpartum care following vaginal delivery (2/2) 07/15/2013  ? Pregnancy induced hypertension   ?  ? ?Family History  ?Problem Relation Age of Onset  ? Hypertension Maternal Grandfather   ? Hyperlipidemia Maternal Grandfather   ? Cancer Paternal Grandmother 5  ?     ovarian cancer  ? Diabetes Paternal Grandmother   ? Cancer Mother   ? ? ? ?Current Outpatient Medications:  ?  hydrochlorothiazide (HYDRODIURIL) 12.5 MG tablet, Take 1 tablet (12.5 mg total) by mouth daily., Disp: 30 tablet,  Rfl: 2  ? ?No Known Allergies  ? ?Review of Systems  ?Constitutional: Negative.   ?Respiratory: Negative.    ?Cardiovascular: Negative.  Negative for chest pain, palpitations and leg swelling.  ?Gastrointestinal:  Positive for abdominal pain (around her navel). Negative for abdominal distention, constipation, diarrhea, nausea and vomiting.  ?Neurological: Negative.   ?Psychiatric/Behavioral: Negative.     ? ?Today's Vitals  ? 09/27/21 0918  ?BP: 128/78  ?Pulse: 84  ?Temp: 98.4 ?F (36.9 ?C)  ?SpO2: 98%  ?Weight: (!) 346 lb 3.2 oz (157 kg)  ? ?Body mass index is 49.67 kg/m?.  ?Wt Readings from Last 3 Encounters:  ?09/27/21 (!) 346 lb 3.2 oz (157 kg)  ?02/23/21 (!) 357 lb 3.2 oz (162 kg)  ?07/01/19 (!) 384 lb (174.2 kg)  ?  ?Objective:  ?Physical Exam ?Vitals reviewed.  ?Constitutional:   ?   General: She is not in acute distress. ?   Appearance: Normal appearance. She is obese.  ?Eyes:  ?   Pupils: Pupils are equal, round, and reactive to light.  ?Cardiovascular:  ?   Rate and Rhythm: Normal rate and regular rhythm.  ?   Pulses: Normal pulses.  ?   Heart sounds: Normal heart sounds. No murmur heard. ?Pulmonary:  ?   Effort: Pulmonary effort is normal. No respiratory distress.  ?   Breath sounds: Normal breath sounds. No wheezing.  ?Abdominal:  ?   General: Abdomen is  flat. There is no distension.  ?   Palpations: Abdomen is soft. There is no mass.  ?   Tenderness: There is abdominal tenderness (above umbilicus).  ?   Hernia: A hernia (slight firmness to abdomen above umbilicus) is present.  ?Skin: ?   Capillary Refill: Capillary refill takes less than 2 seconds.  ?Neurological:  ?   General: No focal deficit present.  ?   Mental Status: She is alert and oriented to person, place, and time.  ?   Cranial Nerves: No cranial nerve deficit.  ?   Motor: No weakness.  ?Psychiatric:     ?   Mood and Affect: Mood normal.     ?   Behavior: Behavior normal.     ?   Thought Content: Thought content normal.     ?   Judgment:  Judgment normal.  ?  ? ?   ?Assessment And Plan:  ?   ?1. Abdominal pain, unspecified abdominal location ?Comments: Tenderness above umbilicus area, slightly firm to mid abdomen ?- US Abdomen Complete; Future ? ?2. Essential hypertension ?Comments: Blood pressure is normal. No current medications ?- BMP8+eGFR ? ?3. Class 3 severe obesity due to excess calories without serious comorbidity with body mass index (BMI) of 50.0 to 59.9 in adult [facility]) ? She is encouraged to strive for BMI less than 30 to decrease cardiac risk. Advised to aim for at least 150 minutes of exercise per week.  ?Congratulated on her weight loss.  ? ?4. History of umbilical hernia ?- US Abdomen Complete; Future ? ? ? ?Patient was given opportunity to ask questions. Patient verbalized understanding of the plan and was able to repeat key elements of the plan. All questions were answered to their satisfaction.  ?Minette Brine, FNP  ? ?I, Minette Brine, FNP, have reviewed all documentation for this visit. The documentation on 09/27/21 for the exam, diagnosis, procedures, and orders are all accurate and complete.  ? ?IF YOU HAVE BEEN REFERRED TO A SPECIALIST, IT MAY TAKE 1-2 WEEKS TO SCHEDULE/PROCESS THE REFERRAL. IF YOU HAVE NOT HEARD FROM US/SPECIALIST IN TWO WEEKS, PLEASE GIVE [place] A CALL AT [phone].  ? ?THE PATIENT IS ENCOURAGED TO PRACTICE SOCIAL DISTANCING DUE TO THE COVID-19 PANDEMIC.   ?

## 2021-09-26 NOTE — ED Provider Notes (Signed)
?[facility] ? ? ? ?CSN: [id number] ?Arrival date & time: 09/26/21  1441 ? ? ?  ? ?History   ?Chief Complaint ?Chief Complaint  ?Patient presents with  ? Hernia  ? ? ?HPI ?Yvonne Ali is a 41 y.o. female presenting with concern for hernia.  History morbid obesity.  Patient states she was on a cruise, on 4/9 she developed a exquisitely painful incarcerated umbilical hernia.  The cruise physician was able to administer IV pain medication and reduce it on the cruise.  She states that today she is having some back pain, as well as indigestion.  She has an appointment with her primary care provider tomorrow 4/18, but wanted to get checked out before hand.  She endorses 1-2 episodes of watery diarrhea daily.  Denies abdominal pain at time of visit.  Also with some midline back pain, worse with movement.  Denies urinary symptoms.  Denies nausea, vomiting, constipation. ? ?HPI ? ?Past Medical History:  ?Diagnosis Date  ? Acute blood loss anemia 02/06/2015  ? Ectopic pregnancy   ? Gestational hypertension, antepartum 02/03/2015  ? Iron deficiency anemia of pregnancy 02/06/2015  ? PE (pulmonary embolism)   ? Postpartum care following vaginal delivery (2/2) 07/15/2013  ? Pregnancy induced hypertension   ? ? ?Patient Active Problem List  ? Diagnosis Date Noted  ? Protein S deficiency (HCC) 07/01/2019  ? Essential hypertension 08/14/2018  ? Postpartum care following vaginal delivery (2/10) 07/22/2018  ? SVD (spontaneous vaginal delivery) 07/22/2018  ? Preeclampsia complicating hypertension 06/25/2018  ? Allergic rhinitis 03/09/2018  ? Obesities, morbid (HCC) 01/03/2012  ? History of pulmonary embolus (PE) 01/03/2012  ? ? ?Past Surgical History:  ?Procedure Laterality Date  ? BARIATRIC SURGERY    ? gastric sleeve  ? HIP SURGERY    ? ? ?OB History   ? ? Gravida  ?4  ? Para  ?3  ? Term  ?3  ? Preterm  ?   ? AB  ?1  ? Living  ?3  ?  ? ? SAB  ?   ? IAB  ?   ? Ectopic  ?1  ? Multiple  ?0  ? Live Births  ?3  ?   ?  ?   ? ? ? ?Home Medications   ? ?Prior to Admission medications   ?Medication Sig Start Date End Date Taking? Authorizing Provider  ?hydrochlorothiazide (HYDRODIURIL) 12.5 MG tablet Take 1 tablet (12.5 mg total) by mouth daily. 02/23/21   Charlesetta Ivory, NP  ? ? ?Family History ?Family History  ?Problem Relation Age of Onset  ? Hypertension Maternal Grandfather   ? Hyperlipidemia Maternal Grandfather   ? Cancer Paternal Grandmother 4  ?     ovarian cancer  ? Diabetes Paternal Grandmother   ? Cancer Mother   ? ? ?Social History ?Social History  ? ?Tobacco Use  ? Smoking status: Former  ?  Types: Cigarettes  ?  Quit date: 05/12/2006  ?  Years since quitting: 15.3  ? Smokeless tobacco: Never  ? Tobacco comments:  ?  quit smoking 8 years ago  ?Vaping Use  ? Vaping Use: Never used  ?Substance Use Topics  ? Alcohol use: Yes  ?  Alcohol/week: 0.0 standard drinks  ?  Comment: stopped with positive UPT  ? Drug use: No  ? ? ? ?Allergies   ?Patient has no known allergies. ? ? ?Review of Systems ?Review of Systems  ?Gastrointestinal:  Positive for abdominal pain.  ?All other systems  reviewed and are negative. ? ? ?Physical Exam ?Triage Vital Signs ?ED Triage Vitals  ?Enc Vitals Group  ?   BP 09/26/21 1518 (!) 140/97  ?   Pulse Rate 09/26/21 1518 97  ?   Resp 09/26/21 1518 20  ?   Temp 09/26/21 1518 98.7 ?F (37.1 ?C)  ?   Temp src --   ?   SpO2 09/26/21 1518 97 %  ?   Weight --   ?   Height --   ?   Head Circumference --   ?   Peak Flow --   ?   Pain Score 09/26/21 1515 3  ?   Pain Loc --   ?   Pain Edu? --   ?   Excl. in GC? --   ? ?No data found. ? ?Updated Vital Signs ?BP (!) 140/97   Pulse 97   Temp 98.7 ?F (37.1 ?C)   Resp 20   LMP 09/06/2021   SpO2 97%  ? ?Visual Acuity ?Right Eye Distance:   ?Left Eye Distance:   ?Bilateral Distance:   ? ?Right Eye Near:   ?Left Eye Near:    ?Bilateral Near:    ? ?Physical Exam ?Vitals reviewed.  ?Constitutional:   ?   General: She is not in acute distress. ?   Appearance: Normal  appearance. She is obese. She is not ill-appearing.  ?HENT:  ?   Head: Normocephalic and atraumatic.  ?   Mouth/Throat:  ?   Mouth: Mucous membranes are moist.  ?   Comments: Moist mucous membranes ?Eyes:  ?   Extraocular Movements: Extraocular movements intact.  ?   Pupils: Pupils are equal, round, and reactive to light.  ?Cardiovascular:  ?   Rate and Rhythm: Normal rate and regular rhythm.  ?   Heart sounds: Normal heart sounds.  ?Pulmonary:  ?   Effort: Pulmonary effort is normal.  ?   Breath sounds: Normal breath sounds. No wheezing, rhonchi or rales.  ?Abdominal:  ?   General: Bowel sounds are normal. There is no distension.  ?   Palpations: Abdomen is soft. There is no mass.  ?   Tenderness: There is no abdominal tenderness. There is no right CVA tenderness, left CVA tenderness, guarding or rebound.  ?   Comments: Exam limited due to body habitus. BS positive throughout. No tenderness to palpation. There is a small umbilical hernia that is nontender, not incarcarated or strangulated. No CVAT.  ?Musculoskeletal:  ?   Comments: No reproducible midline spinous tenderness, deformity, stepoff.   ?Skin: ?   General: Skin is warm.  ?   Capillary Refill: Capillary refill takes less than 2 seconds.  ?   Comments: Good skin turgor  ?Neurological:  ?   General: No focal deficit present.  ?   Mental Status: She is alert and oriented to person, place, and time.  ?Psychiatric:     ?   Mood and Affect: Mood normal.     ?   Behavior: Behavior normal.  ? ? ? ?UC Treatments / Results  ?Labs ?(all labs ordered are listed, but only abnormal results are displayed) ?Labs Reviewed  ?POCT URINALYSIS DIPSTICK, ED / UC - Abnormal; Notable for the following components:  ?    Result Value  ? Bilirubin Urine SMALL (*)   ? Hgb urine dipstick TRACE (*)   ? Protein, ur >=300 (*)   ? Urobilinogen, UA 2.0 (*)   ? All other components within normal  limits  ?POC URINE PREG, ED  ? ? ?EKG ? ? ?Radiology ?No results  found. ? ?Procedures ?Procedures (including critical care time) ? ?Medications Ordered in UC ?Medications - No data to display ? ?Initial Impression / Assessment and Plan / UC Course  ?I have reviewed the triage vital signs and the nursing notes. ? ?Pertinent labs & imaging results that were available during my care of the patient were reviewed by me and considered in my medical decision making (see chart for details). ? ?  ? ?This patient is a very pleasant 41 y.o. year old female presenting with umbilical hernia. At time of visit it is nontender, with no concern for incarceration or strangulation. Last BM was today and was loose. UA wnl, did not send culture. U-preg negative. F/u with PCP tomorrow to discuss surgical referral, or ED if new symptoms before then. ? ?Final Clinical Impressions(s) / UC Diagnoses  ? ?Final diagnoses:  ?Umbilical hernia without obstruction and without gangrene  ? ? ? ?Discharge Instructions   ? ?  ?-You do not appear to have a obstructed hernia today. Follow-up with your PCP to discuss outpatient management. If symptoms return - pain, swelling, constipation - head to the ED ? ? ?ED Prescriptions   ?None ?  ? ?PDMP not reviewed this encounter. ?  ?Rhys MartiniGraham, Lilah Mijangos E, PA-C ?09/26/21 1546 ? ?

## 2021-09-26 NOTE — Discharge Instructions (Addendum)
-  You do not appear to have a obstructed hernia today. Follow-up with your PCP to discuss outpatient management. If symptoms return - pain, swelling, constipation - head to the ED ?

## 2021-09-27 ENCOUNTER — Ambulatory Visit
Admission: RE | Admit: 2021-09-27 | Discharge: 2021-09-27 | Disposition: A | Discharge status: Home / Self Care | Payer: BC Managed Care – PPO | Source: Ambulatory Visit | Attending: Nurse Practitioner | Admitting: Nurse Practitioner

## 2021-09-27 ENCOUNTER — Encounter: Payer: Self-pay | Admitting: Nurse Practitioner

## 2021-09-27 ENCOUNTER — Ambulatory Visit (INDEPENDENT_AMBULATORY_CARE_PROVIDER_SITE_OTHER): Payer: BC Managed Care – PPO | Admitting: Nurse Practitioner

## 2021-09-27 VITALS — BP 128/78 | HR 84 | Temp 98.4°F | Wt 346.2 lb

## 2021-09-27 DIAGNOSIS — R109 Unspecified abdominal pain: Secondary | ICD-10-CM

## 2021-09-27 DIAGNOSIS — I10 Essential (primary) hypertension: Secondary | ICD-10-CM

## 2021-09-27 DIAGNOSIS — Z6841 Body Mass Index (BMI) 40.0 and over, adult: Secondary | ICD-10-CM | POA: Diagnosis not present

## 2021-09-27 DIAGNOSIS — Z8719 Personal history of other diseases of the digestive system: Secondary | ICD-10-CM

## 2021-09-27 NOTE — Patient Instructions (Signed)
Hernia, Adult     A hernia is the bulging of an organ or tissue through a weak spot in the muscles of the abdomen. Hernias develop most often near the belly button (navel) or the area where the leg meets the lower abdomen (groin). Common types of hernias include: Incisional hernia. This type bulges through a scar from an abdominal surgery. Umbilical hernia. This type develops near the navel. Inguinal hernia. This type develops in the groin or scrotum. Femoral hernia. This type develops below the groin, in the upper thigh area. Hiatal hernia. This type occurs when part of the stomach slides above the muscle that separates the abdomen from the chest (diaphragm). What are the causes? This condition may be caused by: Heavy lifting. Coughing over a long period of time. Straining to have a bowel movement. Constipation can lead to straining. An incision made during abdominal surgery. A physical problem that is present at birth (congenital defect). Being overweight or obese. Smoking. Excess fluid in the abdomen. Undescended testicles in males. What are the signs or symptoms? The main symptom is a skin-colored, rounded bulge in the area of the hernia. However, a bulge may not always be present. It may grow bigger or be more visible when you cough or strain (such as when lifting something heavy). A hernia that can be pushed back into the abdomen (is reducible) rarely causes pain. A hernia that cannot be pushed back into the abdomen (is incarcerated) may lose its blood supply (become strangulated). A hernia that is incarcerated may cause: Pain. Fever. Nausea and vomiting. Swelling. Constipation. How is this diagnosed? A hernia may be diagnosed based on: Your symptoms and medical history. A physical exam. Your health care provider may ask you to cough or move in certain ways to see if the hernia becomes visible. Imaging tests, such as: X-rays. Ultrasound. CT scan. How is this treated? A  hernia that is small and painless may not need to be treated. A hernia that is large or painful may be treated with surgery. Inguinal hernias may be treated with surgery to prevent incarceration or strangulation. Strangulated hernias are always treated with surgery because the strangulation causes a lack of blood supply to the trapped organ or tissue. Surgery to treat a hernia involves pushing the bulge back into place and repairing the weak area of the muscle or abdominal wall. Follow these instructions at home: Activity Avoid straining. Do not lift anything that is heavier than 10 lb (4.5 kg), or the limit that you are told, until your health care provider says that it is safe. When lifting heavy objects, lift with your leg muscles, not your back muscles. Preventing constipation Take actions to prevent constipation. Constipation leads to straining with bowel movements, which can make a hernia worse or cause a hernia repair to break down. Your health care provider may recommend that you take these actions to prevent or treat constipation: Drink enough fluid to keep your urine pale yellow. Take over-the-counter or prescription medicines. Eat foods that are high in fiber, such as beans, whole grains, and fresh fruits and vegetables. Limit foods that are high in fat and processed sugars, such as fried or sweet foods. General instructions When coughing, try to cough gently. You may try to push the hernia back in place by very gently pressing on it while lying down. Do not try to force the bulge back in if it will not push in easily. If you are overweight, work with your health care provider   to lose weight safely. Do not use any products that contain nicotine or tobacco. These products include cigarettes, chewing tobacco, and vaping devices, such as e-cigarettes. If you need help quitting, ask your health care provider. If you are scheduled for hernia repair, watch your hernia for any changes in shape,  size, or color. Tell your health care provider about any changes or new symptoms. Take over-the-counter and prescription medicines only as told by your health care provider. Keep all follow-up visits. This is important. Contact a health care provider if: You develop new pain, swelling, or redness around your hernia. You have signs of constipation, such as: Fewer bowel movements in a week than normal. Difficulty having a bowel movement. Stools that are dry, hard, or larger than normal. Get help right away if: You have a fever or chills. You have abdominal pain that gets worse. You feel nauseous or you vomit. You cannot push the hernia back in place by very gently pressing on it while lying down. Do not try to force the bulge back in if it will not go in easily. The hernia: Changes in shape, size, or color. Feels hard or tender. These symptoms may represent a serious problem that is an emergency. Do not wait to see if the symptoms will go away. Get medical help right away. Call your local emergency services (911 in the [place] Do not drive yourself to the hospital. Summary A hernia is the bulging of an organ or tissue through a weak spot in the muscles of the abdomen. The main symptom is a skin-colored bulge in the hernia area. However, a bulge may not always be present. It may grow bigger or more visible when you cough or strain (such as when having a bowel movement). A hernia that is small and painless may not need to be treated. A hernia that is large or painful may be treated with surgery. Surgery to treat a hernia involves pushing the bulge back into place and repairing the weak part of the abdomen. This information is not intended to replace advice given to you by your health care provider. Make sure you discuss any questions you have with your health care provider. Document Revised: 01/05/2020 Document Reviewed: 01/05/2020 Elsevier Patient Education  2023 Elsevier Inc.  

## 2021-09-28 LAB — BMP8+EGFR
BUN/Creatinine Ratio: 12 (ref 9–23)
BUN: 9 mg/dL (ref 6–20)
CO2: 20 mmol/L (ref 20–29)
Calcium: 8.8 mg/dL (ref 8.7–10.2)
Chloride: 104 mmol/L (ref 96–106)
Creatinine, Ser: 0.74 mg/dL (ref 0.57–1.00)
Glucose: 92 mg/dL (ref 70–99)
Potassium: 3.8 mmol/L (ref 3.5–5.2)
Sodium: 141 mmol/L (ref 134–144)
eGFR: 106 mL/min/{1.73_m2} (ref >59)

## 2021-10-04 ENCOUNTER — Encounter: Payer: Self-pay | Admitting: Nurse Practitioner

## 2021-10-05 ENCOUNTER — Other Ambulatory Visit: Payer: Self-pay | Admitting: Nurse Practitioner

## 2021-10-05 DIAGNOSIS — K429 Umbilical hernia without obstruction or gangrene: Secondary | ICD-10-CM

## 2021-11-21 ENCOUNTER — Telehealth: Payer: BC Managed Care – PPO | Admitting: Physician Assistant

## 2021-11-21 DIAGNOSIS — K439 Ventral hernia without obstruction or gangrene: Secondary | ICD-10-CM

## 2021-11-21 NOTE — Progress Notes (Signed)
Virtual Visit Consent   Yvonne Ali, you are scheduled for a virtual visit with a [facility] provider today. Just as with appointments in the office, your consent must be obtained to participate. Your consent will be active for this visit and any virtual visit you may have with one of our providers in the next 365 days. If you have a MyChart account, a copy of this consent can be sent to you electronically.  As this is a virtual visit, video technology does not allow for your provider to perform a traditional examination. This may limit your provider's ability to fully assess your condition. If your provider identifies any concerns that need to be evaluated in person or the need to arrange testing (such as labs, EKG, etc.), we will make arrangements to do so. Although advances in technology are sophisticated, we cannot ensure that it will always work on either your end or our end. If the connection with a video visit is poor, the visit may have to be switched to a telephone visit. With either a video or telephone visit, we are not always able to ensure that we have a secure connection.  By engaging in this virtual visit, you consent to the provision of healthcare and authorize for your insurance to be billed (if applicable) for the services provided during this visit. Depending on your insurance coverage, you may receive a charge related to this service.  I need to obtain your verbal consent now. Are you willing to proceed with your visit today? [name] has provided verbal consent on 11/21/2021 for a virtual visit (video or telephone). Margaretann Loveless, PA-C  Date: 11/21/2021 11:31 AM  Virtual Visit via Video Note   I, Margaretann Loveless, connected with  Yvonne Ali  ([id number], 10-02-1980) on 11/21/21 at 11:15 AM EDT by a video-enabled telemedicine application and verified that I am speaking with the correct person using two identifiers.  Location: Patient: Virtual Visit  Location Patient: Home Provider: Virtual Visit Location Provider: Home Office   I discussed the limitations of evaluation and management by telemedicine and the availability of in person appointments. The patient expressed understanding and agreed to proceed.    History of Present Illness: Yvonne Ali is a 41 y.o. who identifies as a female who was assigned female at birth, and is being seen today for ventral hernia. Had episode of hernia bulging out more yesterday but was able to reduce it on her own. Has history of hernia getting strangulated in April of this year, but was out of [place] when it occurred. Was seen at a local hospital there and they were able to reduce with IV pain medications. Saw PCP in April once she was back and was referred to [facility]. They wanted to wait and have patient lose about 100 pounds prior to surgery. Since she has had a recurrence she is wanting a referral for a second opinion.     Problems:  Patient Active Problem List   Diagnosis Date Noted   Protein S deficiency (HCC) 07/01/2019   Essential hypertension 08/14/2018   Postpartum care following vaginal delivery (2/10) 07/22/2018   SVD (spontaneous vaginal delivery) 07/22/2018   Preeclampsia complicating hypertension 06/25/2018   Allergic rhinitis 03/09/2018   Obesities, morbid (HCC) 01/03/2012   History of pulmonary embolus (PE) 01/03/2012    Allergies: No Known Allergies Medications:  Current Outpatient Medications:    hydrochlorothiazide (HYDRODIURIL) 12.5 MG tablet, Take 1 tablet (12.5 mg  total) by mouth daily., Disp: 30 tablet, Rfl: 2  Observations/Objective: Patient is well-developed, well-nourished in no acute distress.  Resting comfortably at home.  Head is normocephalic, atraumatic.  No labored breathing.  Speech is clear and coherent with logical content.  Patient is alert and oriented at baseline.    Assessment and Plan: 1. Ventral hernia without obstruction or  gangrene  - Advised we could not place referrals through the virtual department - No acute pain or symptoms now, was able to reduce without issue yesterday, no need for emergent care at this time - Contact PCP for referral  - Strict ER precautions if it does bulge and unable to reduce  Follow Up Instructions: I discussed the assessment and treatment plan with the patient. The patient was provided an opportunity to ask questions and all were answered. The patient agreed with the plan and demonstrated an understanding of the instructions.  A copy of instructions were sent to the patient via MyChart unless otherwise noted below.    The patient was advised to call back or seek an in-person evaluation if the symptoms worsen or if the condition fails to improve as anticipated.  Time:  I spent 10 minutes with the patient via telehealth technology discussing the above problems/concerns.    Margaretann Loveless, PA-C

## 2021-11-21 NOTE — Patient Instructions (Signed)
Yvonne Ali, thank you for joining Margaretann Loveless, PA-C for today's virtual visit.  While this provider is not your primary care provider (PCP), if your PCP is located in our provider database this encounter information will be shared with them immediately following your visit.  Consent: (Patient) Yvonne Ali provided verbal consent for this virtual visit at the beginning of the encounter.  Current Medications:  Current Outpatient Medications:    hydrochlorothiazide (HYDRODIURIL) 12.5 MG tablet, Take 1 tablet (12.5 mg total) by mouth daily., Disp: 30 tablet, Rfl: 2   Medications ordered in this encounter:  No orders of the defined types were placed in this encounter.    *If you need refills on other medications prior to your next appointment, please contact your pharmacy*  Follow-Up: Call back or seek an in-person evaluation if the symptoms worsen or if the condition fails to improve as anticipated.  Other Instructions Ventral Hernia  A ventral hernia is a bulge of tissue from inside the abdomen that pushes through a weak area of the muscles that form the front wall of the abdomen. The tissues inside the abdomen are inside a sac (peritoneum). These tissues include the small intestine, large intestine, and the fatty tissue that covers the intestines (omentum). Sometimes, the bulge that forms a hernia contains intestines. Other hernias contain only fat. Ventral hernias do not go away without surgical treatment. There are several types of ventral hernias. You may have: A hernia at an incision site from previous abdominal surgery (incisional hernia). A hernia just above the belly button (epigastric hernia), or at the belly button (umbilical hernia). These types of hernias can develop from heavy lifting or straining. A hernia that comes and goes (reducible hernia). It may be visible only when you lift or strain. This type of hernia can be pushed back into the abdomen  (reduced). A hernia that traps abdominal tissue inside the hernia (incarcerated hernia). This type of hernia does not reduce. A hernia that cuts off blood flow to the tissues inside the hernia (strangulated hernia). The tissues can start to die if this happens. This is a very painful bulge that cannot be reduced. A strangulated hernia is a medical emergency. What are the causes? This condition is caused by abdominal tissue putting pressure on an area of weakness in the abdominal muscles. What increases the risk? The following factors may make you more likely to develop this condition: Being age 6 or older. Being overweight or obese. Having had previous abdominal surgery, especially if there was an infection after surgery. Having had an injury to the abdominal wall. Frequently lifting or pushing heavy objects. Having had several pregnancies. Having a buildup of fluid inside the abdomen (ascites). Straining to have a bowel movement or to urinate. Having frequent coughing episodes. What are the signs or symptoms? The only symptom of a ventral hernia may be a painless bulge in the abdomen. A reducible hernia may be visible only when you strain, cough, or lift. Other symptoms may include: Dull pain. A feeling of pressure. Signs and symptoms of a strangulated hernia may include: Increasing pain. Nausea and vomiting. Pain when pressing on the hernia. The skin over the hernia turning red or purple. Constipation. Blood in the stool (feces). How is this diagnosed? This condition may be diagnosed based on: Your symptoms. Your medical history. A physical exam. You may be asked to cough or strain while standing. These actions increase the pressure inside your abdomen and force the hernia through  the opening in your muscles. Your health care provider may try to reduce the hernia by gently pushing the hernia back in. Imaging studies, such as an ultrasound or CT scan. How is this treated? This  condition is treated with surgery. If you have a strangulated hernia, surgery is done as soon as possible. If your hernia is small and not incarcerated, you may be asked to lose some weight before surgery. Follow these instructions at home: Follow instructions from your health care provider about eating or drinking restrictions. If you are overweight, your health care provider may recommend that you increase your activity level and eat a healthier diet. Do not lift anything that is heavier than 10 lb (4.5 kg), or the limit that you are told, until your health care provider says that it is safe. Return to your normal activities as told by your health care provider. Ask your health care provider what activities are safe for you. You may need to avoid activities that increase pressure on your hernia. Take over-the-counter and prescription medicines only as told by your health care provider. Keep all follow-up visits. This is important. Contact a health care provider if: Your hernia gets larger. Your hernia becomes painful. Get help right away if: Your hernia becomes increasingly painful. You have pain along with any of the following: Changes in skin color in the area of the hernia. Nausea. Vomiting. Fever. These symptoms may represent a serious problem that is an emergency. Do not wait to see if the symptoms will go away. Get medical help right away. Call your local emergency services (911 in the [place] Do not drive yourself to the hospital. Summary A ventral hernia is a bulge of tissue from inside the abdomen that pushes through a weak area of the muscles that form the front wall of the abdomen. This condition is treated with surgery, which may be urgent depending on your hernia. Do not lift anything that is heavier than 10 lb (4.5 kg), and follow activity instructions from your health care provider. This information is not intended to replace advice given to you by your health care provider.  Make sure you discuss any questions you have with your health care provider. Document Revised: 01/16/2020 Document Reviewed: 01/16/2020 Elsevier Patient Education  2023 Elsevier Inc.    If you have been instructed to have an in-person evaluation today at a local Urgent Care facility, please use the link below. It will take you to a list of all of our available [facility], including address, phone number and hours of operation. Please do not delay care.  [facility]  If you or a family member do not have a primary care provider, use the link below to schedule a visit and establish care. When you choose a [facility] primary care physician or advanced practice provider, you gain a long-term partner in health. Find a Primary Care Provider  Learn more about [facility]'s in-office and virtual care options: [facility] - Get Care Now

## 2021-11-29 ENCOUNTER — Encounter: Payer: Self-pay | Admitting: Nurse Practitioner

## 2021-11-29 ENCOUNTER — Other Ambulatory Visit (HOSPITAL_COMMUNITY): Payer: Self-pay

## 2021-11-29 ENCOUNTER — Ambulatory Visit (INDEPENDENT_AMBULATORY_CARE_PROVIDER_SITE_OTHER): Payer: BC Managed Care – PPO | Admitting: Nurse Practitioner

## 2021-11-29 VITALS — BP 124/70 | HR 73 | Temp 98.2°F | Ht 70.0 in | Wt 352.0 lb

## 2021-11-29 DIAGNOSIS — K429 Umbilical hernia without obstruction or gangrene: Secondary | ICD-10-CM | POA: Diagnosis not present

## 2021-11-29 DIAGNOSIS — Z6841 Body Mass Index (BMI) 40.0 and over, adult: Secondary | ICD-10-CM

## 2021-11-29 MED ORDER — SAXENDA 18 MG/3ML ~~LOC~~ SOPN
3.0000 mg | PEN_INJECTOR | Freq: Every day | SUBCUTANEOUS | 1 refills | Status: DC
  Filled 2021-11-29: qty 3, 90d supply, fill #0
  Filled 2021-11-30 – 2021-12-01 (×2): qty 15, 30d supply, fill #0

## 2021-11-29 MED ORDER — PEN NEEDLES 32G X 4 MM MISC
Freq: Every day | 4 refills | Status: DC
  Filled 2021-11-29 – 2021-12-01 (×2): qty 100, 100d supply, fill #0

## 2021-11-29 NOTE — Patient Instructions (Signed)
Hernia, Adult     A hernia happens when an organ or tissue inside your body pushes out through a weak spot in the muscles of your belly (abdomen). This makes a bulge. The bulge may be: In a scar from a surgery that was done in your belly (incisional hernia). Near your belly button (umbilical hernia). In your groin (inguinal hernia). Your groin is the area where your leg meets your lower belly. If you are a female, this type could also be in your scrotum. In your upper thigh (femoral hernia). Inside your belly (hiatal hernia). This happens when your stomach slides above the muscle between your belly and your chest (diaphragm). What are the causes? This condition may be caused by: Lifting heavy things. Coughing over a long period of time. Having trouble pooping (constipation). Trouble pooping can lead to straining. A cut from surgery in your belly. A physical problem that is present at birth. Being very overweight. Smoking. Too much fluid in your belly. A testicle that has not moved down into the scrotum, in males. What are the signs or symptoms? The main symptom is a bulge in the area of the hernia, but a bulge may not always be seen. It may grow bigger or be easier to see when you cough or strain (such as when lifting something heavy). A hernia that can be pushed back into the belly rarely causes pain. A hernia that cannot be pushed back into the belly may lose its blood supply. This may cause: Pain. Fever. A feeling like you may vomit, and vomiting. Swelling. Trouble pooping. How is this treated? A hernia that is small and painless may not need to be treated. A hernia that is large or painful may be treated with surgery. Surgery to treat a hernia involves pushing the bulge back into place and repairing the weak area of the muscle or belly. Follow these instructions at home: Activity Avoid straining the muscles near your hernia. This can happen when you: Lift something heavy. Poop  (have a bowel movement). Do not lift anything that is heavier than 10 lb (4.5 kg), or the limit that you are told. When you lift something heavy, use your leg muscles. Do not use your back muscles to lift. Prevent trouble pooping If told by your doctor, take steps to prevent trouble pooping. You may need to: Drink enough fluid to keep your pee (urine) pale yellow. Take medicines. You will be told what medicines to take. Eat foods that are high in fiber. These include beans, whole grains, and fresh fruits and vegetables. Limit foods that are high in fat and sugar. These include fried or sweet foods. General instructions When you cough, try to cough gently. You may try to push your hernia back in by gently pressing on it when you are lying down. Do not try to force the bulge back in if it will not go in easily. If you are overweight, work with your doctor to lose weight safely. Do not smoke or use any products that contain nicotine or tobacco. If you need help quitting, ask your doctor. If you will be having surgery, watch your hernia for changes in shape, size, or color. Tell your doctor if you see any changes. Take over-the-counter and prescription medicines only as told by your doctor. Keep all follow-up visits. Contact a doctor if: You get new pain, swelling, or redness near your hernia. You poop fewer times in a week than normal. You have trouble pooping. You have   poop that is more dry than normal. You have poop that is harder or larger than normal. Get help right away if: You have a fever or chills. You have belly pain that gets worse. You feel like you may vomit, or you vomit. Your hernia cannot be pushed in by gently pressing on it when you are lying down. Your hernia: Changes in shape or size. Changes color. Feels hard, or it hurts when you touch it. These symptoms may be an emergency. Get help right away. Call your local emergency services (911 in the [place] Do not wait to  see if the symptoms will go away. Do not drive yourself to the hospital. Summary A hernia happens when an organ or tissue inside your body pushes out through a weak spot in the belly muscles. This creates a bulge. If your hernia is small and it does not hurt, you may not need treatment. If your hernia is large or it hurts, you may need surgery. If you will be having surgery, watch your hernia for changes in shape, size, or color. Tell your doctor about any changes. This information is not intended to replace advice given to you by your health care provider. Make sure you discuss any questions you have with your health care provider. Document Revised: 01/05/2020 Document Reviewed: 01/05/2020 Elsevier Patient Education  2023 Elsevier Inc.  

## 2021-11-29 NOTE — Progress Notes (Signed)
I,Tianna Badgett,acting as a Neurosurgeon for [employer], FNP.,have documented all relevant documentation on the behalf of [name], FNP,as directed by  [name], FNP while in the presence of [name], FNP.  This visit occurred during the SARS-CoV-2 public health emergency.  Safety protocols were in place, including screening questions prior to the visit, additional usage of staff PPE, and extensive cleaning of exam room while observing appropriate contact time as indicated for disinfecting solutions.  Subjective:     Patient ID: Yvonne Ali , female    DOB: [birth year] , 41 y.o.   MRN: [id number]   Chief Complaint  Patient presents with   Hernia    HPI  Pt presents today for an umbilical hernia, she reports having surgery for this in 2011. She states that she went to see surgeon for this but they want her to lose 100lbs and she feels she is unable to do this.  She went to see her in May. She had to push the hernia back in recently. She works as a Best boy at [employer]. She does heavy lifting and needs her accomdation form completed. No lifting or pushing above 10 lbs.   Wt Readings from Last 3 Encounters: 11/29/21 : (!) 352 lb (159.7 kg) 09/27/21 : (!) 346 lb 3.2 oz (157 kg) 02/23/21 : (!) 357 lb 3.2 oz (162 kg)  She has not taken any medications for weight loss, she has cut back on her starches, juices. She is walking 2 times a week - 15 minutes each time, she will have abdominal pain and lower back pain. She reports she had a history of sleep apnea - not using a CPAP. Has been about 15 years. She felt was uncomfortable.      Past Medical History:  Diagnosis Date   Acute blood loss anemia 02/06/2015   Ectopic pregnancy    Gestational hypertension, antepartum 02/03/2015   Iron deficiency anemia of pregnancy 02/06/2015   PE (pulmonary embolism)    Postpartum care following vaginal delivery (2/2) 07/15/2013   Pregnancy induced hypertension      Family History  Problem  Relation Age of Onset   Hypertension Maternal Grandfather    Hyperlipidemia Maternal Grandfather    Cancer Paternal Grandmother 74       ovarian cancer   Diabetes Paternal Grandmother    Cancer Mother      Current Outpatient Medications:    hydrochlorothiazide (HYDRODIURIL) 12.5 MG tablet, Take 1 tablet (12.5 mg total) by mouth daily., Disp: 30 tablet, Rfl: 2   Liraglutide -Weight Management (SAXENDA) 18 MG/3ML SOPN, Inject 3 mg into the skin daily., Disp: 15 mL, Rfl: 1   No Known Allergies   Review of Systems  Constitutional: Negative.   Respiratory: Negative.    Cardiovascular: Negative.   Gastrointestinal: Negative.   Neurological: Negative.      Today's Vitals   11/29/21 1207  BP: 124/70  Pulse: 73  Temp: 98.2 F (36.8 C)  TempSrc: Oral  Weight: (!) 352 lb (159.7 kg)  Height: 5\' 10"  (1.778 m)   Body mass index is 50.51 kg/m.   Objective:  Physical Exam Vitals reviewed.  Constitutional:      General: She is not in acute distress.    Appearance: Normal appearance. She is obese.  Eyes:     Pupils: Pupils are equal, round, and reactive to light.  Cardiovascular:     Rate and Rhythm: Normal rate and regular rhythm.     Pulses: Normal pulses.  Heart sounds: Normal heart sounds. No murmur heard. Pulmonary:     Effort: Pulmonary effort is normal. No respiratory distress.     Breath sounds: Normal breath sounds. No wheezing.  Abdominal:     Tenderness: Tenderness: above umbilicus.     Hernia: Hernia: slight firmness to abdomen above umbilicus.  Skin:    Capillary Refill: Capillary refill takes less than 2 seconds.  Neurological:     General: No focal deficit present.     Mental Status: She is alert and oriented to person, place, and time.     Cranial Nerves: No cranial nerve deficit.     Motor: No weakness.  Psychiatric:        Mood and Affect: Mood normal.        Behavior: Behavior normal.        Thought Content: Thought content normal.         Judgment: Judgment normal.         Assessment And Plan:     1. Paraumbilical hernia Comments: She would like a second opinion. I discussed the importance of weight loss and risk associated with surgery with weight. - Ambulatory referral to General Surgery  2. Class 3 severe obesity due to excess calories without serious comorbidity with body mass index (BMI) of 50.0 to 59.9 in adult [facility]) Comments: I have strongly encouraged her to focus on losing weight. She is encouraged to strive for BMI less than 30 to decrease cardiac risk. Advised to aim for at least 150 minutes of exercise per week. we have started [place] pending insurance approval, discussed side effects to include nausea, difficulty swallowing and abdominal pain. She is to titrate weekly as tolerated. Goal to lose 10% body weight in 4 months if approved by insurance.  - Liraglutide -Weight Management (SAXENDA) 18 MG/3ML SOPN; Inject 3 mg into the skin daily.  Dispense: 15 mL; Refill: 1 - Amb ref to Medical Nutrition Therapy-MNT - Amb Referral To Provider Referral Exercise Program (P.R.E.P)     Patient was given opportunity to ask questions. Patient verbalized understanding of the plan and was able to repeat key elements of the plan. All questions were answered to their satisfaction.  [name], FNP   I, [name], FNP, have reviewed all documentation for this visit. The documentation on 11/29/21 for the exam, diagnosis, procedures, and orders are all accurate and complete.   IF YOU HAVE BEEN REFERRED TO A SPECIALIST, IT MAY TAKE 1-2 WEEKS TO SCHEDULE/PROCESS THE REFERRAL. IF YOU HAVE NOT HEARD FROM US/SPECIALIST IN TWO WEEKS, PLEASE GIVE [place] A CALL AT [phone].   THE PATIENT IS ENCOURAGED TO PRACTICE SOCIAL DISTANCING DUE TO THE COVID-19 PANDEMIC.

## 2021-11-30 ENCOUNTER — Other Ambulatory Visit (HOSPITAL_COMMUNITY): Payer: Self-pay

## 2021-11-30 ENCOUNTER — Encounter: Payer: Self-pay | Admitting: Nurse Practitioner

## 2021-12-01 ENCOUNTER — Other Ambulatory Visit (HOSPITAL_COMMUNITY): Payer: Self-pay

## 2021-12-02 ENCOUNTER — Telehealth: Payer: Self-pay

## 2021-12-02 NOTE — Telephone Encounter (Signed)
Called to discuss PREP program referral, left voicemail  

## 2021-12-05 ENCOUNTER — Other Ambulatory Visit (HOSPITAL_COMMUNITY): Payer: Self-pay

## 2021-12-06 ENCOUNTER — Telehealth: Payer: Self-pay

## 2021-12-06 NOTE — Telephone Encounter (Signed)
Returned her call, explained PREP, she works nights and has kids at home for the summer; wants to know August schedule at  both [facility]; will contact In July once Aug/Sept schedules planned at both facilities.

## 2021-12-12 ENCOUNTER — Other Ambulatory Visit (HOSPITAL_COMMUNITY): Payer: Self-pay

## 2021-12-14 ENCOUNTER — Encounter: Payer: Self-pay | Admitting: Nurse Practitioner

## 2021-12-23 DIAGNOSIS — Z903 Acquired absence of stomach [part of]: Secondary | ICD-10-CM | POA: Insufficient documentation

## 2022-01-24 ENCOUNTER — Encounter: Payer: Self-pay | Admitting: Nurse Practitioner

## 2022-01-25 ENCOUNTER — Ambulatory Visit: Payer: BC Managed Care – PPO | Admitting: Skilled Nursing Facility1

## 2022-02-01 ENCOUNTER — Encounter: Payer: Self-pay | Admitting: Nurse Practitioner

## 2022-02-01 ENCOUNTER — Ambulatory Visit (INDEPENDENT_AMBULATORY_CARE_PROVIDER_SITE_OTHER): Payer: BC Managed Care – PPO | Admitting: Nurse Practitioner

## 2022-02-01 VITALS — BP 132/70 | HR 83 | Temp 98.0°F | Ht 70.0 in | Wt 351.0 lb

## 2022-02-01 DIAGNOSIS — Z9889 Other specified postprocedural states: Secondary | ICD-10-CM | POA: Diagnosis not present

## 2022-02-01 DIAGNOSIS — Z8616 Personal history of COVID-19: Secondary | ICD-10-CM | POA: Diagnosis not present

## 2022-02-01 DIAGNOSIS — R0981 Nasal congestion: Secondary | ICD-10-CM | POA: Diagnosis not present

## 2022-02-01 DIAGNOSIS — Z8719 Personal history of other diseases of the digestive system: Secondary | ICD-10-CM

## 2022-02-01 NOTE — Patient Instructions (Signed)

## 2022-02-01 NOTE — Progress Notes (Signed)
Yvonne Ali,acting as a Neurosurgeon for Yvonne Felts, FNP.,have documented all relevant documentation on the behalf of Yvonne Felts, FNP,as directed by  Yvonne Felts, FNP while in the presence of Yvonne Felts, FNP.    Subjective:     Patient ID: Yvonne Ali , female    DOB: 1980-11-05 , 41 y.o.   MRN: [id number]   No chief complaint on file.   HPI  Patient presents today for a hospital follow-up, she had been on a cruise and the night prior to debarking the ship she had abdomen pain and hernia would not reduce. She had an incarcerated hernia, she was admitted and had emergency surgery. She also had Covid - her oxygen levels dropped to the 80's, she went to have CT lung and ruled out PE but was positive for covid. She was treated with antiviral and decadron. She was also given a blood thinner but has not had since last Thursday.   patient was admitted on August 13th and discharged August 18th in [place] for an umbilical hernia. They repaired her incarcerated hernia.   Patient tested positive Aug. 13th for covid. She is to follow up with the surgeon in [place] on September 7th. She is to be out until the 11th of September.    Since her surgery she is feeling better and does not have as much pain and her shortness of breath is gone.       Past Medical History:  Diagnosis Date   Acute blood loss anemia 02/06/2015   Ectopic pregnancy    Gestational hypertension, antepartum 02/03/2015   Iron deficiency anemia of pregnancy 02/06/2015   PE (pulmonary embolism)    Postpartum care following vaginal delivery (2/2) 07/15/2013   Pregnancy induced hypertension      Family History  Problem Relation Age of Onset   Hypertension Maternal Grandfather    Hyperlipidemia Maternal Grandfather    Cancer Paternal Grandmother 6       ovarian cancer   Diabetes Paternal Grandmother    Cancer Mother      Current Outpatient Medications:    hydrochlorothiazide (HYDRODIURIL) 12.5 MG tablet, Take 1 tablet  (12.5 mg total) by mouth daily., Disp: 30 tablet, Rfl: 2   Insulin Pen Needle (PEN NEEDLES) 32G X 4 MM MISC, Use once daily., Disp: 100 each, Rfl: 4   Liraglutide -Weight Management (SAXENDA) 18 MG/3ML SOPN, Inject 3 mg into the skin daily., Disp: 15 mL, Rfl: 1   No Known Allergies   Review of Systems  Constitutional: Negative.   HENT: Negative.    Eyes: Negative.   Respiratory: Negative.    Cardiovascular: Negative.   Gastrointestinal:  Positive for abdominal pain (mild tenderness to surgical area).  Psychiatric/Behavioral: Negative.       Today's Vitals   02/01/22 1429  BP: 132/70  Pulse: 83  Temp: 98 F (36.7 C)  TempSrc: Oral  Weight: (!) 351 lb (159.2 kg)  Height: 5\' 10"  (1.778 m)  PainSc: 0-No pain   Body mass index is 50.36 kg/m.   Objective:  Physical Exam Vitals reviewed.  Constitutional:      Appearance: Normal appearance.  Cardiovascular:     Rate and Rhythm: Normal rate and regular rhythm.     Pulses: Normal pulses.     Heart sounds: Normal heart sounds. No murmur heard. Pulmonary:     Effort: Pulmonary effort is normal. No respiratory distress.     Breath sounds: Normal breath sounds. No wheezing.  Abdominal:  General: Bowel sounds are normal. There is no distension.     Palpations: Abdomen is soft.     Tenderness: There is abdominal tenderness (mild on palpation).  Skin:    General: Skin is warm and dry.     Capillary Refill: Capillary refill takes less than 2 seconds.     Comments: Abdomen with 4 areas of surgical glue from hernia repair  Neurological:     General: No focal deficit present.     Mental Status: She is alert and oriented to person, place, and time.     Cranial Nerves: No cranial nerve deficit.     Motor: No weakness.  Psychiatric:        Mood and Affect: Mood normal.        Behavior: Behavior normal.        Thought Content: Thought content normal.        Judgment: Judgment normal.         Assessment And Plan:     1.  Nasal congestion Comments: Lingering from recent covid, she is encouraged to use nasal spray and can take over the counter antihistamine  2. H/O hernia repair Comments: Done in [place], fmla until 9/11, 2 weeks later do not lift more than 10 lbs for 2 weeks. Healing well.   3. History of COVID-19 Comments: Dx on 8/13, feeling better continues to have loss of taste and smell.    No TCM done due to patient had hospitalization in [place] and was unaware of when patient was discharged.   Patient was given opportunity to ask questions. Patient verbalized understanding of the plan and was able to repeat key elements of the plan. All questions were answered to their satisfaction.  Yvonne Felts, FNP   I, Yvonne Felts, FNP, have reviewed all documentation for this visit. The documentation on 08/32323 for the exam, diagnosis, procedures, and orders are all accurate and complete.   IF YOU HAVE BEEN REFERRED TO A SPECIALIST, IT MAY TAKE 1-2 WEEKS TO SCHEDULE/PROCESS THE REFERRAL. IF YOU HAVE NOT HEARD FROM US/SPECIALIST IN TWO WEEKS, PLEASE GIVE [place] A CALL AT [phone].   THE PATIENT IS ENCOURAGED TO PRACTICE SOCIAL DISTANCING DUE TO THE COVID-19 PANDEMIC.

## 2022-02-08 ENCOUNTER — Ambulatory Visit: Payer: BC Managed Care – PPO | Admitting: Skilled Nursing Facility1

## 2022-02-09 ENCOUNTER — Other Ambulatory Visit (HOSPITAL_COMMUNITY): Payer: Self-pay

## 2022-02-20 ENCOUNTER — Other Ambulatory Visit (HOSPITAL_COMMUNITY): Payer: Self-pay

## 2022-03-02 ENCOUNTER — Encounter: Payer: BC Managed Care – PPO | Admitting: Nurse Practitioner

## 2022-03-02 NOTE — Progress Notes (Signed)
Not seen

## 2022-03-04 ENCOUNTER — Encounter: Payer: Self-pay | Admitting: Nurse Practitioner

## 2022-03-05 NOTE — Addendum Note (Signed)
Addended by: Minette Brine F on: 03/05/2022 10:15 PM   Modules accepted: Level of Service

## 2022-04-10 ENCOUNTER — Ambulatory Visit: Payer: BC Managed Care – PPO | Admitting: Dietician

## 2022-07-03 DIAGNOSIS — Z01419 Encounter for gynecological examination (general) (routine) without abnormal findings: Secondary | ICD-10-CM | POA: Diagnosis not present

## 2023-04-12 ENCOUNTER — Other Ambulatory Visit: Payer: Self-pay

## 2023-04-12 ENCOUNTER — Encounter: Payer: Self-pay | Admitting: Nurse Practitioner

## 2023-04-12 ENCOUNTER — Other Ambulatory Visit (HOSPITAL_COMMUNITY): Payer: Self-pay

## 2023-04-12 ENCOUNTER — Ambulatory Visit: Payer: BC Managed Care – PPO | Admitting: Nurse Practitioner

## 2023-04-12 VITALS — BP 128/72 | HR 91 | Temp 98.5°F | Ht 70.0 in | Wt 356.4 lb

## 2023-04-12 DIAGNOSIS — Z1231 Encounter for screening mammogram for malignant neoplasm of breast: Secondary | ICD-10-CM | POA: Insufficient documentation

## 2023-04-12 DIAGNOSIS — Z6841 Body Mass Index (BMI) 40.0 and over, adult: Secondary | ICD-10-CM | POA: Diagnosis not present

## 2023-04-12 DIAGNOSIS — Z0000 Encounter for general adult medical examination without abnormal findings: Secondary | ICD-10-CM | POA: Diagnosis not present

## 2023-04-12 DIAGNOSIS — Z13220 Encounter for screening for lipoid disorders: Secondary | ICD-10-CM

## 2023-04-12 DIAGNOSIS — E66813 Obesity, class 3: Secondary | ICD-10-CM | POA: Insufficient documentation

## 2023-04-12 DIAGNOSIS — Z803 Family history of malignant neoplasm of breast: Secondary | ICD-10-CM

## 2023-04-12 DIAGNOSIS — I10 Essential (primary) hypertension: Secondary | ICD-10-CM

## 2023-04-12 DIAGNOSIS — Z2821 Immunization not carried out because of patient refusal: Secondary | ICD-10-CM | POA: Insufficient documentation

## 2023-04-12 DIAGNOSIS — Z79899 Other long term (current) drug therapy: Secondary | ICD-10-CM | POA: Diagnosis not present

## 2023-04-12 HISTORY — DX: Encounter for screening mammogram for malignant neoplasm of breast: Z12.31

## 2023-04-12 HISTORY — DX: Immunization not carried out because of patient refusal: Z28.21

## 2023-04-12 MED ORDER — HYDROCHLOROTHIAZIDE 12.5 MG PO TABS
12.5000 mg | ORAL_TABLET | Freq: Every day | ORAL | 1 refills | Status: DC
  Filled 2023-04-12: qty 30, 30d supply, fill #0
  Filled 2023-07-19: qty 30, 30d supply, fill #1

## 2023-04-12 MED ORDER — WEGOVY 0.5 MG/0.5ML ~~LOC~~ SOAJ
0.5000 mg | SUBCUTANEOUS | 0 refills | Status: DC
  Filled 2023-04-12 – 2023-04-28 (×2): qty 2, 28d supply, fill #0

## 2023-04-12 NOTE — Assessment & Plan Note (Signed)
Blood pressure is well controlled, continue current medications.

## 2023-04-12 NOTE — Progress Notes (Unsigned)
Madelaine Bhat, CMA,acting as a Neurosurgeon for [name], FNP.,have documented all relevant documentation on the behalf of [name], FNP,as directed by  [name], FNP while in the presence of [name], FNP.  Subjective:    Patient ID: Yvonne Ali , female    DOB: January 23, 1981 , 42 y.o.   MRN: [id number]  No chief complaint on file.   HPI  Patient presents today for HM, Patient reports compliance with medication. Patient denies any chest pain, SOB, or headaches. Patient has no concerns today. She has seen her OB/GYN  Wt Readings from Last 3 Encounters: 04/12/23 : (!) 356 lb 6.4 oz (161.7 kg) 02/01/22 : (!) 351 lb (159.2 kg) 11/29/21 : (!) 352 lb (159.7 kg)  She was on Saxenda in June of last year. She has been trying not to eat as much starch. She is not drinking sodas. She is having more knee pain and hip pain. She has lost weight being that her highest weight last year was 370 lbs. She is starting to feel the effects more than she did before. Would like to continue cutting out as much sugar as possible. Would like to start off with walking at least 3-4 times a week. She has not tried phentermine in the past.         Past Medical History:  Diagnosis Date   Acute blood loss anemia 02/06/2015   Ectopic pregnancy    Gestational hypertension, antepartum 02/03/2015   Iron deficiency anemia of pregnancy 02/06/2015   PE (pulmonary embolism)    Postpartum care following vaginal delivery (2/2) 07/15/2013   Pregnancy induced hypertension      Family History  Problem Relation Age of Onset   Hypertension Maternal Grandfather    Hyperlipidemia Maternal Grandfather    Cancer Paternal Grandmother 49       ovarian cancer   Diabetes Paternal Grandmother    Cancer Mother      Current Outpatient Medications:    Insulin Pen Needle (PEN NEEDLES) 32G X 4 MM MISC, Use once daily., Disp: 100 each, Rfl: 4   Semaglutide-Weight Management (WEGOVY) 0.5 MG/0.5ML SOAJ, Inject 0.5 mg into  the skin once a week., Disp: 2 mL, Rfl: 0   hydrochlorothiazide (HYDRODIURIL) 12.5 MG tablet, Take 1 tablet (12.5 mg total) by mouth daily., Disp: 90 tablet, Rfl: 1   No Known Allergies    The patient states she uses none for birth control. Patient's last menstrual period was 04/05/2023.. Negative for Dysmenorrhea and Negative for Menorrhagia. Negative for: breast discharge, breast lump(s), breast pain and breast self exam. Associated symptoms include abnormal vaginal bleeding. Pertinent negatives include abnormal bleeding (hematology), anxiety, decreased libido, depression, difficulty falling sleep, dyspareunia, history of infertility, nocturia, sexual dysfunction, sleep disturbances, urinary incontinence, urinary urgency, vaginal discharge and vaginal itching. Diet regular: limits her intake of pork and beef.  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   The patient's tobacco use is:  Social History   Tobacco Use  Smoking Status Former   Current packs/day: 0.00   Types: Cigarettes   Quit date: 05/12/2006   Years since quitting: 16.9  Smokeless Tobacco Never  Tobacco Comments   quit smoking 8 years ago   She has been exposed to passive smoke. The patient's alcohol use is:  Social History   Substance and Sexual Activity  Alcohol Use Yes   Comment: stopped with positive UPT   Additional information:  Last pap 06/01/2021, next one scheduled for 06/01/2024.    Review of Systems  Constitutional: Negative.   HENT: Negative.    Eyes: Negative.   Respiratory: Negative.    Cardiovascular: Negative.   Gastrointestinal: Negative.   Endocrine: Negative.   Genitourinary: Negative.   Musculoskeletal: Negative.   Skin: Negative.   Allergic/Immunologic: Negative.   Neurological: Negative.   Hematological: Negative.   Psychiatric/Behavioral: Negative.       Today's Vitals   04/12/23 0859  BP: 128/72  Pulse: 91  Temp: 98.5 F  (36.9 C)  TempSrc: Oral  Weight: (!) 356 lb 6.4 oz (161.7 kg)  Height: 5\' 10"  (1.778 m)  PainSc: 0-No pain   Body mass index is 51.14 kg/m.  Wt Readings from Last 3 Encounters:  04/12/23 (!) 356 lb 6.4 oz (161.7 kg)  02/01/22 (!) 351 lb (159.2 kg)  11/29/21 (!) 352 lb (159.7 kg)     Objective:  Physical Exam Vitals reviewed.  Constitutional:      General: She is not in acute distress.    Appearance: Normal appearance. She is well-developed.     Comments: Morbid obese  HENT:     Head: Normocephalic and atraumatic.     Right Ear: Hearing, tympanic membrane, ear canal and external ear normal. There is no impacted cerumen.     Left Ear: Hearing, tympanic membrane, ear canal and external ear normal. There is no impacted cerumen.     Nose: Nose normal.     Mouth/Throat:     Mouth: Mucous membranes are moist.  Eyes:     General: Lids are normal.     Extraocular Movements: Extraocular movements intact.     Conjunctiva/sclera: Conjunctivae normal.     Pupils: Pupils are equal, round, and reactive to light.     Funduscopic exam:    Right eye: No papilledema.        Left eye: No papilledema.  Neck:     Thyroid: No thyroid mass.     Vascular: No carotid bruit.  Cardiovascular:     Rate and Rhythm: Normal rate and regular rhythm.     Pulses: Normal pulses.     Heart sounds: Normal heart sounds. No murmur heard. Pulmonary:     Effort: Pulmonary effort is normal. No respiratory distress.     Breath sounds: Normal breath sounds. No wheezing.  Chest:     Chest wall: No mass.  Breasts:    Tanner Score is 5.     Right: Normal. No mass or tenderness.     Left: Normal. No mass or tenderness.  Abdominal:     General: Abdomen is flat. Bowel sounds are normal. There is no distension.     Palpations: Abdomen is soft.     Tenderness: There is no abdominal tenderness.  Genitourinary:    Comments: Deferred - followed by GYN Musculoskeletal:        General: No swelling. Normal range of  motion.     Cervical back: Full passive range of motion without pain, normal range of motion and neck supple.     Right lower leg: No edema.     Left lower leg: No edema.  Lymphadenopathy:     Upper Body:     Right upper body: No supraclavicular, axillary or pectoral adenopathy.     Left upper body: No supraclavicular, axillary or pectoral adenopathy.  Skin:    General: Skin is warm and dry.     Capillary Refill: Capillary refill takes less  than 2 seconds.  Neurological:     General: No focal deficit present.     Mental Status: She is alert and oriented to person, place, and time.     Cranial Nerves: No cranial nerve deficit.     Sensory: No sensory deficit.     Motor: No weakness.  Psychiatric:        Mood and Affect: Mood normal.        Behavior: Behavior normal.        Thought Content: Thought content normal.        Judgment: Judgment normal.         Assessment And Plan:     Encounter for annual health examination Assessment & Plan: Behavior modifications discussed and diet history reviewed.   Pt will continue to exercise regularly and modify diet with low GI, plant based foods and decrease intake of processed foods.  Recommend intake of daily multivitamin, Vitamin D, and calcium.  Recommend mammogram for preventive screenings, as well as recommend immunizations that include influenza, TDAP    Essential hypertension Assessment & Plan: Blood pressure is well controlled, continue current medications  Orders: -     EKG 12-Lead -     POCT URINALYSIS DIP (CLINITEK) -     Microalbumin / creatinine urine ratio -     CMP14+EGFR -     hydroCHLOROthiazide; Take 1 tablet (12.5 mg total) by mouth daily.  Dispense: 90 tablet; Refill: 1  COVID-19 vaccination declined Assessment & Plan: Declines covid 19 vaccine. Discussed risk of covid 14 and if she changes her mind about the vaccine to call the office. Education has been provided regarding the importance of this vaccine but  patient still declined. Advised may receive this vaccine at local pharmacy or Health Dept.or vaccine clinic. Aware to provide a copy of the vaccination record if obtained from local pharmacy or Health Dept.  Encouraged to take multivitamin, vitamin d, vitamin c and zinc to increase immune system. Aware can call office if would like to have vaccine here at office. Verbalized acceptance and understanding.     Class 3 severe obesity due to excess calories without serious comorbidity with body mass index (BMI) of 50.0 to 59.9 in adult [facility]) Assessment & Plan: She is encouraged to strive for BMI less than 30 to decrease cardiac risk. Advised to aim for at least 150 minutes of exercise per week as tolerated with strength training.  Orders: -     Hemoglobin A1c -     Wegovy; Inject 0.5 mg into the skin once a week.  Dispense: 2 mL; Refill: 0  Other long term (current) drug therapy -     CBC with Differential/Platelet  Encounter for screening for lipid disorder -     Lipid panel  Encounter for screening mammogram for breast cancer Assessment & Plan: Pt instructed on Self Breast Exam.According to ACOG guidelines Women aged 89 and older are recommended to get an annual mammogram. Order completed.    Orders: -     3D Screening Mammogram, Left and Right; Future  Family history of breast cancer -     3D Screening Mammogram, Left and Right; Future     Return for 1 year physical, 6 month bp check. Patient was given opportunity to ask questions. Patient verbalized understanding of the plan and was able to repeat key elements of the plan. All questions were answered to their satisfaction.   [name], FNP  I, [name], FNP, have reviewed all  documentation for this visit. The documentation on 04/12/23 for the exam, diagnosis, procedures, and orders are all accurate and complete.

## 2023-04-13 LAB — CBC WITH DIFFERENTIAL/PLATELET
Basophils Absolute: 0 10*3/uL (ref 0.0–0.2)
Basos: 1 %
EOS (ABSOLUTE): 0.2 10*3/uL (ref 0.0–0.4)
Eos: 3 %
Hematocrit: 34.7 % (ref 34.0–46.6)
Hemoglobin: 10.1 g/dL — ABNORMAL LOW (ref 11.1–15.9)
Immature Grans (Abs): 0 10*3/uL (ref 0.0–0.1)
Immature Granulocytes: 0 %
Lymphocytes Absolute: 2.1 10*3/uL (ref 0.7–3.1)
Lymphs: 43 %
MCH: 23 pg — ABNORMAL LOW (ref 26.6–33.0)
MCHC: 29.1 g/dL — ABNORMAL LOW (ref 31.5–35.7)
MCV: 79 fL (ref 79–97)
Monocytes Absolute: 0.5 10*3/uL (ref 0.1–0.9)
Monocytes: 11 %
Neutrophils Absolute: 2 10*3/uL (ref 1.4–7.0)
Neutrophils: 42 %
Platelets: 351 10*3/uL (ref 150–450)
RBC: 4.39 x10E6/uL (ref 3.77–5.28)
RDW: 15.6 % — ABNORMAL HIGH (ref 11.7–15.4)
WBC: 4.9 10*3/uL (ref 3.4–10.8)

## 2023-04-13 LAB — CMP14+EGFR
ALT: 12 [IU]/L (ref 0–32)
AST: 13 [IU]/L (ref 0–40)
Albumin: 3.7 g/dL — ABNORMAL LOW (ref 3.9–4.9)
Alkaline Phosphatase: 72 [IU]/L (ref 44–121)
BUN/Creatinine Ratio: 15 (ref 9–23)
BUN: 13 mg/dL (ref 6–24)
Bilirubin Total: 0.4 mg/dL (ref 0.0–1.2)
CO2: 22 mmol/L (ref 20–29)
Calcium: 9.1 mg/dL (ref 8.7–10.2)
Chloride: 105 mmol/L (ref 96–106)
Creatinine, Ser: 0.86 mg/dL (ref 0.57–1.00)
Globulin, Total: 3.3 g/dL (ref 1.5–4.5)
Glucose: 89 mg/dL (ref 70–99)
Potassium: 4.4 mmol/L (ref 3.5–5.2)
Sodium: 139 mmol/L (ref 134–144)
Total Protein: 7 g/dL (ref 6.0–8.5)
eGFR: 88 mL/min/{1.73_m2} (ref >59)

## 2023-04-13 LAB — MICROALBUMIN / CREATININE URINE RATIO
Creatinine, Urine: 229.8 mg/dL
Microalb/Creat Ratio: 10 mg/g{creat} (ref 0–29)
Microalbumin, Urine: 21.9 ug/mL

## 2023-04-13 LAB — LIPID PANEL
Chol/HDL Ratio: 2.8 ratio (ref 0.0–4.4)
Cholesterol, Total: 201 mg/dL — ABNORMAL HIGH (ref 100–199)
HDL: 71 mg/dL (ref >39)
LDL Chol Calc (NIH): 116 mg/dL — ABNORMAL HIGH (ref 0–99)
Triglycerides: 76 mg/dL (ref 0–149)
VLDL Cholesterol Cal: 14 mg/dL (ref 5–40)

## 2023-04-13 LAB — HEMOGLOBIN A1C
Est. average glucose Bld gHb Est-mCnc: 123 mg/dL
Hgb A1c MFr Bld: 5.9 % — ABNORMAL HIGH (ref 4.8–5.6)

## 2023-04-19 ENCOUNTER — Other Ambulatory Visit (HOSPITAL_COMMUNITY): Payer: Self-pay

## 2023-04-24 NOTE — Assessment & Plan Note (Signed)
 Behavior modifications discussed and diet history reviewed.   Pt will continue to exercise regularly and modify diet with low GI, plant based foods and decrease intake of processed foods.  Recommend intake of daily multivitamin, Vitamin D, and calcium.  Recommend mammogram for preventive screenings, as well as recommend immunizations that include influenza, TDAP

## 2023-04-24 NOTE — Assessment & Plan Note (Signed)
She is encouraged to strive for BMI less than 30 to decrease cardiac risk. Advised to aim for at least 150 minutes of exercise per week as tolerated with strength training.

## 2023-04-24 NOTE — Assessment & Plan Note (Signed)

## 2023-04-24 NOTE — Assessment & Plan Note (Signed)
Pt instructed on Self Breast Exam.According to ACOG guidelines Women aged 40 and older are recommended to get an annual mammogram. Order completed.

## 2023-04-25 LAB — POCT URINALYSIS DIP (CLINITEK)
Bilirubin, UA: NEGATIVE
Blood, UA: NEGATIVE
Glucose, UA: NEGATIVE mg/dL
Ketones, POC UA: NEGATIVE mg/dL
Leukocytes, UA: NEGATIVE
Nitrite, UA: NEGATIVE
POC PROTEIN,UA: NEGATIVE
Spec Grav, UA: >=1.03 — AB (ref 1.010–1.025)
Urobilinogen, UA: 0.2 U/dL
pH, UA: 6 (ref 5.0–8.0)

## 2023-04-27 ENCOUNTER — Telehealth: Payer: Self-pay

## 2023-04-28 ENCOUNTER — Other Ambulatory Visit (HOSPITAL_COMMUNITY): Payer: Self-pay

## 2023-04-30 ENCOUNTER — Other Ambulatory Visit (HOSPITAL_COMMUNITY): Payer: Self-pay

## 2023-04-30 NOTE — Telephone Encounter (Signed)
Looking at Rx benefits.

## 2023-05-03 ENCOUNTER — Other Ambulatory Visit (HOSPITAL_COMMUNITY): Payer: Self-pay

## 2023-05-03 MED ORDER — COVID-19 MRNA VAC-TRIS(PFIZER) 30 MCG/0.3ML IM SUSY
0.3000 mL | PREFILLED_SYRINGE | Freq: Once | INTRAMUSCULAR | 0 refills | Status: AC
  Filled 2023-05-03: qty 0.3, 1d supply, fill #0

## 2023-06-08 ENCOUNTER — Ambulatory Visit
Admission: RE | Admit: 2023-06-08 | Discharge: 2023-06-08 | Disposition: A | Discharge status: Home / Self Care | Payer: BC Managed Care – PPO | Source: Ambulatory Visit | Attending: Nurse Practitioner | Admitting: Nurse Practitioner

## 2023-06-08 DIAGNOSIS — Z1231 Encounter for screening mammogram for malignant neoplasm of breast: Secondary | ICD-10-CM | POA: Diagnosis not present

## 2023-06-08 DIAGNOSIS — Z803 Family history of malignant neoplasm of breast: Secondary | ICD-10-CM

## 2023-07-19 ENCOUNTER — Other Ambulatory Visit: Payer: Self-pay | Admitting: Nurse Practitioner

## 2023-07-19 ENCOUNTER — Other Ambulatory Visit: Payer: Self-pay

## 2023-07-19 DIAGNOSIS — E66813 Obesity, class 3: Secondary | ICD-10-CM

## 2023-07-19 DIAGNOSIS — Z01419 Encounter for gynecological examination (general) (routine) without abnormal findings: Secondary | ICD-10-CM | POA: Diagnosis not present

## 2023-07-20 ENCOUNTER — Other Ambulatory Visit (HOSPITAL_COMMUNITY): Payer: Self-pay

## 2023-07-20 MED ORDER — WEGOVY 0.5 MG/0.5ML ~~LOC~~ SOAJ
0.5000 mg | SUBCUTANEOUS | 0 refills | Status: DC
  Filled 2023-07-20 – 2023-08-22 (×2): qty 2, 28d supply, fill #0

## 2023-07-31 ENCOUNTER — Other Ambulatory Visit (HOSPITAL_COMMUNITY): Payer: Self-pay

## 2023-08-01 ENCOUNTER — Other Ambulatory Visit (HOSPITAL_COMMUNITY): Payer: Self-pay

## 2023-08-22 ENCOUNTER — Other Ambulatory Visit (HOSPITAL_COMMUNITY): Payer: Self-pay

## 2023-10-01 ENCOUNTER — Ambulatory Visit: Payer: BC Managed Care – PPO | Admitting: Nurse Practitioner

## 2023-10-11 ENCOUNTER — Ambulatory Visit: Admitting: Nurse Practitioner

## 2023-10-11 NOTE — Progress Notes (Deleted)
 [place], CMA,acting as a Neurosurgeon for Yvonne Epley, FNP.,have documented all relevant documentation on the behalf of Yvonne Epley, FNP,as directed by  Yvonne Epley, FNP while in the presence of Yvonne Epley, FNP.  Subjective:  Patient ID: Yvonne Ali , female    DOB: [birth year] , 43 y.o.   MRN: [id number]  No chief complaint on file.   HPI  HPI   Past Medical History:  Diagnosis Date   Acute blood loss anemia 02/06/2015   Ectopic pregnancy    Gestational hypertension, antepartum 02/03/2015   Iron  deficiency anemia of pregnancy 02/06/2015   PE (pulmonary embolism)    Postpartum care following vaginal delivery (2/2) 07/15/2013   Pregnancy induced hypertension      Family History  Problem Relation Age of Onset   Breast cancer Mother 48   Cancer Mother    Hypertension Maternal Grandfather    Hyperlipidemia Maternal Grandfather    Diabetes Paternal Grandmother    Ovarian cancer Paternal Grandmother 13   BRCA 1/2 Neg Hx      Current Outpatient Medications:    hydrochlorothiazide  (HYDRODIURIL ) 12.5 MG tablet, Take 1 tablet (12.5 mg total) by mouth daily., Disp: 90 tablet, Rfl: 1   Insulin  Pen Needle (PEN NEEDLES) 32G X 4 MM MISC, Use once daily., Disp: 100 each, Rfl: 4   Semaglutide -Weight Management (WEGOVY ) 0.5 MG/0.5ML SOAJ, Inject 0.5 mg into the skin once a week., Disp: 2 mL, Rfl: 0   No Known Allergies   Review of Systems   There were no vitals filed for this visit. There is no height or weight on file to calculate BMI.  Wt Readings from Last 3 Encounters:  04/12/23 (!) 356 lb 6.4 oz (161.7 kg)  02/01/22 (!) 351 lb (159.2 kg)  11/29/21 (!) 352 lb (159.7 kg)    The 10-year ASCVD risk score (Arnett DK, et al., 2019) is: 0.8%   Values used to calculate the score:     Age: 67 years     Sex: Female     Is Non-Hispanic African American: Yes     Diabetic: No     Tobacco smoker: No     Systolic Blood Pressure: 128 mmHg     Is BP treated: Yes     HDL Cholesterol:  71 mg/dL     Total Cholesterol: 201 mg/dL  Objective:  Physical Exam      Assessment And Plan:  Essential hypertension    No follow-ups on file.  Patient was given opportunity to ask questions. Patient verbalized understanding of the plan and was able to repeat key elements of the plan. All questions were answered to their satisfaction.    Inge Mangle, FNP, have reviewed all documentation for this visit. The documentation on 10/11/23 for the exam, diagnosis, procedures, and orders are all accurate and complete.   IF YOU HAVE BEEN REFERRED TO A SPECIALIST, IT MAY TAKE 1-2 WEEKS TO SCHEDULE/PROCESS THE REFERRAL. IF YOU HAVE NOT HEARD FROM US /SPECIALIST IN TWO WEEKS, PLEASE GIVE US  A CALL AT [phone].

## 2023-10-25 ENCOUNTER — Telehealth

## 2023-10-25 ENCOUNTER — Telehealth: Admitting: Physician Assistant

## 2023-10-25 DIAGNOSIS — I872 Venous insufficiency (chronic) (peripheral): Secondary | ICD-10-CM

## 2023-10-25 NOTE — Progress Notes (Signed)
 E Visit for Rash  We are sorry that you are not feeling well. Here is how we plan to help!  Based on the presentation of the rash, I do believe this is venous stasis dermatitis. Venous stasis dermatitis can occur when you have venous insufficiency that causes swelling in the lower legs. This swelling can cause the skin to stretch and start to cause a darkening of the skin tone, sometimes even a deep purple color can arise in severe or prolonged cases. This can also cause itching and drying of the skin as well. I will send more information about venous stasis dermatitis following this message.   The best course of treatment is to elevate your legs anytime you are at rest, wear compression stockings through the day, remove at night. Moisturize your skin well using a thick moisturizer like lubriderm, eucerin, aquaphor, shea butter, or cocoa butter.    HOME CARE:  Take cool showers and avoid direct sunlight. Apply cool compress or wet dressings. Take a bath in an oatmeal bath.  Sprinkle content of one Aveeno packet under running faucet with comfortably warm water.  Bathe for 15-20 minutes, 1-2 times daily.  Pat dry with a towel. Do not rub the rash. Use hydrocortisone cream. Take an antihistamine like Benadryl  for widespread rashes that itch.  The adult dose of Benadryl  is 25-50 mg by mouth 4 times daily. Caution:  This type of medication may cause sleepiness.  Do not drink alcohol, drive, or operate dangerous machinery while taking antihistamines.  Do not take these medications if you have prostate enlargement.  Read package instructions thoroughly on all medications that you take.  GET HELP RIGHT AWAY IF:  Symptoms don't go away after treatment. Severe itching that persists. If you rash spreads or swells. If you rash begins to smell. If it blisters and opens or develops a yellow-brown crust. You develop a fever. You have a sore throat. You become short of breath.  MAKE SURE  YOU:  Understand these instructions. Will watch your condition. Will get help right away if you are not doing well or get worse.  Thank you for choosing an e-visit.  Your e-visit answers were reviewed by a board certified advanced clinical practitioner to complete your personal care plan. Depending upon the condition, your plan could have included both over the counter or prescription medications.  Please review your pharmacy choice. Make sure the pharmacy is open so you can pick up prescription now. If there is a problem, you may contact your provider through [employer] and have the prescription routed to another pharmacy.  Your safety is important to us . If you have drug allergies check your prescription carefully.   For the next 24 hours you can use MyChart to ask questions about today's visit, request a non-urgent call back, or ask for a work or school excuse. You will get an email in the next two days asking about your experience. I hope that your e-visit has been valuable and will speed your recovery.    I have spent 5 minutes in review of e-visit questionnaire, review and updating patient chart, medical decision making and response to patient.   Angelia Kelp, PA-C

## 2024-03-11 ENCOUNTER — Other Ambulatory Visit (HOSPITAL_COMMUNITY): Payer: Self-pay

## 2024-03-11 MED ORDER — COVID-19 MRNA VAC-TRIS(PFIZER) 30 MCG/0.3ML IM SUSY
0.3000 mL | PREFILLED_SYRINGE | Freq: Once | INTRAMUSCULAR | 0 refills | Status: AC
  Filled 2024-03-11: qty 0.3, 1d supply, fill #0

## 2024-03-11 MED ORDER — FLUZONE 0.5 ML IM SUSY
0.5000 mL | PREFILLED_SYRINGE | Freq: Once | INTRAMUSCULAR | 0 refills | Status: AC
  Filled 2024-03-11: qty 0.5, 1d supply, fill #0

## 2024-03-16 ENCOUNTER — Emergency Department (HOSPITAL_BASED_OUTPATIENT_CLINIC_OR_DEPARTMENT_OTHER)
Admission: EM | Admit: 2024-03-16 | Discharge: 2024-03-16 | Disposition: A | Discharge status: Home / Self Care | Attending: Emergency Medicine | Admitting: Emergency Medicine

## 2024-03-16 ENCOUNTER — Encounter (HOSPITAL_BASED_OUTPATIENT_CLINIC_OR_DEPARTMENT_OTHER): Payer: Self-pay

## 2024-03-16 ENCOUNTER — Other Ambulatory Visit: Payer: Self-pay

## 2024-03-16 DIAGNOSIS — L309 Dermatitis, unspecified: Secondary | ICD-10-CM | POA: Insufficient documentation

## 2024-03-16 DIAGNOSIS — R42 Dizziness and giddiness: Secondary | ICD-10-CM | POA: Diagnosis not present

## 2024-03-16 DIAGNOSIS — L299 Pruritus, unspecified: Secondary | ICD-10-CM | POA: Diagnosis not present

## 2024-03-16 DIAGNOSIS — Z794 Long term (current) use of insulin: Secondary | ICD-10-CM | POA: Diagnosis not present

## 2024-03-16 DIAGNOSIS — R21 Rash and other nonspecific skin eruption: Secondary | ICD-10-CM | POA: Diagnosis not present

## 2024-03-16 DIAGNOSIS — L308 Other specified dermatitis: Secondary | ICD-10-CM | POA: Diagnosis not present

## 2024-03-16 LAB — URINALYSIS, ROUTINE W REFLEX MICROSCOPIC
Bilirubin Urine: NEGATIVE
Glucose, UA: NEGATIVE mg/dL
Hgb urine dipstick: NEGATIVE
Ketones, ur: NEGATIVE mg/dL
Leukocytes,Ua: NEGATIVE
Nitrite: NEGATIVE
Protein, ur: NEGATIVE mg/dL
Specific Gravity, Urine: 1.011 (ref 1.005–1.030)
pH: 6 (ref 5.0–8.0)

## 2024-03-16 LAB — CBC WITH DIFFERENTIAL/PLATELET
Abs Immature Granulocytes: 0.02 K/uL (ref 0.00–0.07)
Basophils Absolute: 0 K/uL (ref 0.0–0.1)
Basophils Relative: 1 %
Eosinophils Absolute: 0.1 K/uL (ref 0.0–0.5)
Eosinophils Relative: 1 %
HCT: 32.4 % — ABNORMAL LOW (ref 36.0–46.0)
Hemoglobin: 9.6 g/dL — ABNORMAL LOW (ref 12.0–15.0)
Immature Granulocytes: 0 %
Lymphocytes Relative: 16 %
Lymphs Abs: 1 K/uL (ref 0.7–4.0)
MCH: 22.5 pg — ABNORMAL LOW (ref 26.0–34.0)
MCHC: 29.6 g/dL — ABNORMAL LOW (ref 30.0–36.0)
MCV: 76.1 fL — ABNORMAL LOW (ref 80.0–100.0)
Monocytes Absolute: 0.5 K/uL (ref 0.1–1.0)
Monocytes Relative: 7 %
Neutro Abs: 4.8 K/uL (ref 1.7–7.7)
Neutrophils Relative %: 75 %
Platelets: 315 K/uL (ref 150–400)
RBC: 4.26 MIL/uL (ref 3.87–5.11)
RDW: 17.2 % — ABNORMAL HIGH (ref 11.5–15.5)
WBC: 6.4 K/uL (ref 4.0–10.5)
nRBC: 0 % (ref 0.0–0.2)

## 2024-03-16 LAB — BASIC METABOLIC PANEL WITH GFR
Anion gap: 9 (ref 5–15)
BUN: 16 mg/dL (ref 6–20)
CO2: 24 mmol/L (ref 22–32)
Calcium: 9.7 mg/dL (ref 8.9–10.3)
Chloride: 105 mmol/L (ref 98–111)
Creatinine, Ser: 0.89 mg/dL (ref 0.44–1.00)
GFR, Estimated: >60 mL/min (ref >60)
Glucose, Bld: 107 mg/dL — ABNORMAL HIGH (ref 70–99)
Potassium: 4 mmol/L (ref 3.5–5.1)
Sodium: 138 mmol/L (ref 135–145)

## 2024-03-16 LAB — PREGNANCY, URINE: Preg Test, Ur: NEGATIVE

## 2024-03-16 MED ORDER — SODIUM CHLORIDE 0.9 % IV BOLUS
1000.0000 mL | Freq: Once | INTRAVENOUS | Status: AC
  Administered 2024-03-16: 1000 mL via INTRAVENOUS

## 2024-03-16 MED ORDER — TRIAMCINOLONE ACETONIDE 0.1 % EX CREA: 1.0000 | TOPICAL_CREAM | Freq: Two times a day (BID) | CUTANEOUS | 0 refills | Status: AC

## 2024-03-16 MED ORDER — ACETAMINOPHEN 325 MG PO TABS
650.0000 mg | ORAL_TABLET | Freq: Four times a day (QID) | ORAL | Status: AC | PRN
  Administered 2024-03-16: 650 mg via ORAL
  Filled 2024-03-16: qty 2

## 2024-03-16 MED ORDER — MECLIZINE HCL 25 MG PO TABS: 25.0000 mg | ORAL_TABLET | Freq: Three times a day (TID) | ORAL | 0 refills | Status: AC | PRN

## 2024-03-16 NOTE — ED Triage Notes (Signed)
 Arrives via EMS from home with complaints of developing a suspected allergic reaction x1 day. Patient started developing bumps/rash to her chest and took benadryl  at home with minimal relief. No oral swelling or shortness of breath.  EMS does report she used a new laundry detergent.

## 2024-03-16 NOTE — ED Triage Notes (Signed)
--  patient also reports dizziness and feeling off balance.

## 2024-03-16 NOTE — ED Notes (Signed)
 Patient given discharge instructions. Questions were answered. Patient verbalized understanding of discharge instructions and care at home.

## 2024-03-16 NOTE — ED Provider Notes (Signed)
 [place] [facility] AT [facility]  Provider Note  CSN: [id number] Arrival date & time: 03/16/24 9278  History Chief Complaint  Patient presents with  . Rash    Yvonne Ali is a 43 y.o. female reports she noticed an itchy rash on her chest yesterday. Used some topical benadryl  with some improvement. Was more itchy this morning so she took some oral benadryl  then reports feeling a little lightheaded and like she couldn't take a deep breath. No tongue or throat swelling. No difficulty swallowing, but she became anxious and called EMS who brought her here for evaluation.    Home Medications Prior to Admission medications   Medication Sig Start Date End Date Taking? Authorizing Provider  triamcinolone cream (KENALOG) 0.1 % Apply 1 Application topically 2 (two) times daily. 03/16/24  Yes Roselyn Carlin NOVAK, MD  hydrochlorothiazide  (HYDRODIURIL ) 12.5 MG tablet Take 1 tablet (12.5 mg total) by mouth daily. 04/12/23   Georgina Speaks, FNP  Insulin  Pen Needle (PEN NEEDLES) 32G X 4 MM MISC Use once daily. 11/29/21   Georgina Speaks, FNP  Semaglutide -Weight Management (WEGOVY ) 0.5 MG/0.5ML SOAJ Inject 0.5 mg into the skin once a week. 07/20/23   Georgina Speaks, FNP     Allergies    Patient has no known allergies.   Review of Systems   Review of Systems Please see HPI for pertinent positives and negatives  Physical Exam BP (!) 167/89 (BP Location: Right Arm)   Pulse (!) 102   Temp 98.5 F (36.9 C) (Oral)   Resp 20   Ht 5' 10 (1.778 m)   Wt (!) 158.8 kg   SpO2 100%   BMI 50.22 kg/m   Physical Exam Vitals and nursing note reviewed.  Constitutional:      Appearance: Normal appearance.  HENT:     Head: Normocephalic and atraumatic.     Nose: Nose normal.     Mouth/Throat:     Mouth: Mucous membranes are moist.     Comments: No angio edema Eyes:     Extraocular Movements: Extraocular movements intact.     Conjunctiva/sclera: Conjunctivae normal.   Cardiovascular:     Rate and Rhythm: Normal rate.  Pulmonary:     Effort: Pulmonary effort is normal.     Breath sounds: Normal breath sounds. No stridor. No wheezing.  Abdominal:     General: Abdomen is flat.     Palpations: Abdomen is soft.     Tenderness: There is no abdominal tenderness.  Musculoskeletal:        General: No swelling. Normal range of motion.     Cervical back: Neck supple.  Skin:    General: Skin is warm and dry.     Findings: Rash (small area of erythema to anterior chest, no other areas of concern) present.  Neurological:     General: No focal deficit present.     Mental Status: She is alert.  Psychiatric:        Mood and Affect: Mood normal.     ED Results / Procedures / Treatments   EKG None  Procedures Procedures  Medications Ordered in the ED Medications - No data to display  Initial Impression and Plan  Patient here with small area of localized dermatitis, likely a reaction to recent change in detergent, but no signs of systemic reaction or anaphylaxis. Given localized nature, recommend topical steroids, continue oral antihistamines for itching. PCP follow up, RTED for any other concerns.    ED Course  Clinical Course as of 03/16/24 9061  [place] Mar 16, 2024  0807 Prior to discharge, patient states she was feeling dizzy and wobbly walking to the bathroom. States it felt like the room was spinning. This does not seem to be related to the rash that initially brought here in, but she reports decreased appetite recently. Has not been taking her BP meds as directed due to increased urinary frequency. No fever or vomiting. Exam remains unremarkable, normal neuro exam, normal ear exam. Symptoms sound vertiginous, but no nystagmus and no significant ataxia. Will check basic labs, give IVF and re-assess.  [CS]  0848 CBC with anemia, about at baseline.  [CS]  0908 BMP is normal.  [CS]  0925 UA is normal.  [CS]  0929 HCG is neg.  [CS]  9062 Labs are  unremarkable. Patient feeling better. Plan discharge as above but will add meclizine as needed for vertiginous dizziness. PCP follow up, RTED for any other concerns.   [CS]    Clinical Course User Index [CS] Roselyn Carlin NOVAK, MD     MDM Rules/Calculators/A&P Medical Decision Making Problems Addressed: Dermatitis: acute illness or injury  Risk Prescription drug management.     Final Clinical Impression(s) / ED Diagnoses Final diagnoses:  Dermatitis    Rx / DC Orders ED Discharge Orders          Ordered    triamcinolone cream (KENALOG) 0.1 %  2 times daily        03/16/24 0741             Roselyn Carlin NOVAK, MD 03/16/24 [phone]

## 2024-04-14 ENCOUNTER — Encounter: Payer: Self-pay | Admitting: Nurse Practitioner

## 2024-04-14 NOTE — Progress Notes (Deleted)
 LILLETTE Kristeen JINNY Gladis, CMA,acting as a neurosurgeon for [name], FNP.,have documented all relevant documentation on the behalf of [name], FNP,as directed by  [name], FNP while in the presence of [name], FNP.  Subjective:    Patient ID: Yvonne Ali , female    DOB: 07-Jul-1980 , 43 y.o.   MRN: [id number]  No chief complaint on file.   HPI  HPI   Past Medical History:  Diagnosis Date   Acute blood loss anemia 02/06/2015   Ectopic pregnancy    Gestational hypertension, antepartum 02/03/2015   Iron  deficiency anemia of pregnancy 02/06/2015   PE (pulmonary embolism)    Postpartum care following vaginal delivery (2/2) 07/15/2013   Pregnancy induced hypertension      Family History  Problem Relation Age of Onset   Breast cancer Mother 73   Cancer Mother    Hypertension Maternal Grandfather    Hyperlipidemia Maternal Grandfather    Diabetes Paternal Grandmother    Ovarian cancer Paternal Grandmother 56   BRCA 1/2 Neg Hx      Current Outpatient Medications:    hydrochlorothiazide  (HYDRODIURIL ) 12.5 MG tablet, Take 1 tablet (12.5 mg total) by mouth daily., Disp: 90 tablet, Rfl: 1   Insulin  Pen Needle (PEN NEEDLES) 32G X 4 MM MISC, Use once daily., Disp: 100 each, Rfl: 4   meclizine (ANTIVERT) 25 MG tablet, Take 1 tablet (25 mg total) by mouth 3 (three) times daily as needed for dizziness., Disp: 30 tablet, Rfl: 0   Semaglutide -Weight Management (WEGOVY ) 0.5 MG/0.5ML SOAJ, Inject 0.5 mg into the skin once a week., Disp: 2 mL, Rfl: 0   triamcinolone cream (KENALOG) 0.1 %, Apply 1 Application topically 2 (two) times daily., Disp: 30 g, Rfl: 0   No Known Allergies    The patient states she uses {contraceptive methods:5051} for birth control. No LMP recorded.. {Dysmenorrhea-menorrhagia:21918}. Negative for: breast discharge, breast lump(s), breast pain and breast self exam. Associated symptoms include abnormal vaginal bleeding. Pertinent negatives include abnormal bleeding  (hematology), anxiety, decreased libido, depression, difficulty falling sleep, dyspareunia, history of infertility, nocturia, sexual dysfunction, sleep disturbances, urinary incontinence, urinary urgency, vaginal discharge and vaginal itching. Diet regular.The patient states her exercise level is    . The patient's tobacco use is:  Social History   Tobacco Use  Smoking Status Former   Current packs/day: 0.00   Types: Cigarettes   Quit date: 05/12/2006   Years since quitting: 17.9  Smokeless Tobacco Never  Tobacco Comments   quit smoking 8 years ago  . She has been exposed to passive smoke. The patient's alcohol use is:  Social History   Substance and Sexual Activity  Alcohol Use Yes   Comment: stopped with positive UPT  . Additional information: Last pap ***, next one scheduled for ***.    Review of Systems   There were no vitals filed for this visit. There is no height or weight on file to calculate BMI.  Wt Readings from Last 3 Encounters:  03/16/24 (!) 350 lb (158.8 kg)  04/12/23 (!) 356 lb 6.4 oz (161.7 kg)  02/01/22 (!) 351 lb (159.2 kg)     Objective:  Physical Exam      Assessment And Plan:     Encounter for annual health examination  Essential hypertension     No follow-ups on file. Patient was given opportunity to ask questions. Patient verbalized understanding of the plan and was able to repeat key elements of the plan. All questions were answered  to their satisfaction.   [name], FNP  I, [name], FNP, have reviewed all documentation for this visit. The documentation on 04/14/24 for the exam, diagnosis, procedures, and orders are all accurate and complete.

## 2024-04-16 ENCOUNTER — Observation Stay (HOSPITAL_COMMUNITY)

## 2024-04-16 ENCOUNTER — Other Ambulatory Visit: Payer: Self-pay

## 2024-04-16 ENCOUNTER — Encounter (HOSPITAL_BASED_OUTPATIENT_CLINIC_OR_DEPARTMENT_OTHER): Payer: Self-pay | Admitting: Emergency Medicine

## 2024-04-16 ENCOUNTER — Emergency Department (HOSPITAL_BASED_OUTPATIENT_CLINIC_OR_DEPARTMENT_OTHER)

## 2024-04-16 ENCOUNTER — Inpatient Hospital Stay (HOSPITAL_BASED_OUTPATIENT_CLINIC_OR_DEPARTMENT_OTHER)
Admission: EM | Admit: 2024-04-16 | Discharge: 2024-04-18 | DRG: 309 | Disposition: A | Discharge status: Home / Self Care | Attending: Internal Medicine | Admitting: Internal Medicine

## 2024-04-16 ENCOUNTER — Other Ambulatory Visit (HOSPITAL_COMMUNITY): Payer: Self-pay

## 2024-04-16 DIAGNOSIS — Z86711 Personal history of pulmonary embolism: Secondary | ICD-10-CM | POA: Diagnosis not present

## 2024-04-16 DIAGNOSIS — Z87891 Personal history of nicotine dependence: Secondary | ICD-10-CM | POA: Diagnosis not present

## 2024-04-16 DIAGNOSIS — R531 Weakness: Secondary | ICD-10-CM | POA: Diagnosis not present

## 2024-04-16 DIAGNOSIS — Z7985 Long-term (current) use of injectable non-insulin antidiabetic drugs: Secondary | ICD-10-CM | POA: Diagnosis not present

## 2024-04-16 DIAGNOSIS — I480 Paroxysmal atrial fibrillation: Principal | ICD-10-CM | POA: Diagnosis present

## 2024-04-16 DIAGNOSIS — I340 Nonrheumatic mitral (valve) insufficiency: Secondary | ICD-10-CM | POA: Diagnosis not present

## 2024-04-16 DIAGNOSIS — Z28310 Unvaccinated for covid-19: Secondary | ICD-10-CM | POA: Diagnosis not present

## 2024-04-16 DIAGNOSIS — I4891 Unspecified atrial fibrillation: Secondary | ICD-10-CM | POA: Diagnosis not present

## 2024-04-16 DIAGNOSIS — Z6841 Body Mass Index (BMI) 40.0 and over, adult: Secondary | ICD-10-CM

## 2024-04-16 DIAGNOSIS — Z9884 Bariatric surgery status: Secondary | ICD-10-CM | POA: Diagnosis not present

## 2024-04-16 DIAGNOSIS — I4892 Unspecified atrial flutter: Secondary | ICD-10-CM | POA: Diagnosis present

## 2024-04-16 DIAGNOSIS — Z79899 Other long term (current) drug therapy: Secondary | ICD-10-CM | POA: Diagnosis not present

## 2024-04-16 DIAGNOSIS — Z8041 Family history of malignant neoplasm of ovary: Secondary | ICD-10-CM

## 2024-04-16 DIAGNOSIS — R0602 Shortness of breath: Secondary | ICD-10-CM | POA: Diagnosis not present

## 2024-04-16 DIAGNOSIS — D6859 Other primary thrombophilia: Secondary | ICD-10-CM | POA: Diagnosis present

## 2024-04-16 DIAGNOSIS — D509 Iron deficiency anemia, unspecified: Secondary | ICD-10-CM | POA: Diagnosis not present

## 2024-04-16 DIAGNOSIS — E66813 Obesity, class 3: Secondary | ICD-10-CM | POA: Diagnosis present

## 2024-04-16 DIAGNOSIS — Z7901 Long term (current) use of anticoagulants: Secondary | ICD-10-CM

## 2024-04-16 DIAGNOSIS — Z91148 Patient's other noncompliance with medication regimen for other reason: Secondary | ICD-10-CM

## 2024-04-16 DIAGNOSIS — I10 Essential (primary) hypertension: Secondary | ICD-10-CM | POA: Diagnosis present

## 2024-04-16 DIAGNOSIS — Z8249 Family history of ischemic heart disease and other diseases of the circulatory system: Secondary | ICD-10-CM | POA: Diagnosis not present

## 2024-04-16 DIAGNOSIS — Z803 Family history of malignant neoplasm of breast: Secondary | ICD-10-CM

## 2024-04-16 DIAGNOSIS — R946 Abnormal results of thyroid function studies: Secondary | ICD-10-CM | POA: Diagnosis present

## 2024-04-16 DIAGNOSIS — Z8349 Family history of other endocrine, nutritional and metabolic diseases: Secondary | ICD-10-CM | POA: Diagnosis not present

## 2024-04-16 DIAGNOSIS — Z833 Family history of diabetes mellitus: Secondary | ICD-10-CM | POA: Diagnosis not present

## 2024-04-16 DIAGNOSIS — G4733 Obstructive sleep apnea (adult) (pediatric): Secondary | ICD-10-CM | POA: Diagnosis present

## 2024-04-16 LAB — MRSA NEXT GEN BY PCR, NASAL: MRSA by PCR Next Gen: NOT DETECTED

## 2024-04-16 LAB — HCG, QUANTITATIVE, PREGNANCY: hCG, Beta Chain, Quant, S: <1 m[IU]/mL (ref <5)

## 2024-04-16 LAB — BASIC METABOLIC PANEL WITH GFR
Anion gap: 12 (ref 5–15)
BUN: 10 mg/dL (ref 6–20)
CO2: 22 mmol/L (ref 22–32)
Calcium: 9.3 mg/dL (ref 8.9–10.3)
Chloride: 104 mmol/L (ref 98–111)
Creatinine, Ser: 1.02 mg/dL — ABNORMAL HIGH (ref 0.44–1.00)
GFR, Estimated: >60 mL/min
Glucose, Bld: 113 mg/dL — ABNORMAL HIGH (ref 70–99)
Potassium: 3.6 mmol/L (ref 3.5–5.1)
Sodium: 138 mmol/L (ref 135–145)

## 2024-04-16 LAB — CBC
HCT: 36.2 % (ref 36.0–46.0)
Hemoglobin: 10.6 g/dL — ABNORMAL LOW (ref 12.0–15.0)
MCH: 22.2 pg — ABNORMAL LOW (ref 26.0–34.0)
MCHC: 29.3 g/dL — ABNORMAL LOW (ref 30.0–36.0)
MCV: 75.7 fL — ABNORMAL LOW (ref 80.0–100.0)
Platelets: 444 K/uL — ABNORMAL HIGH (ref 150–400)
RBC: 4.78 MIL/uL (ref 3.87–5.11)
RDW: 17.4 % — ABNORMAL HIGH (ref 11.5–15.5)
WBC: 6.8 K/uL (ref 4.0–10.5)
nRBC: 0 % (ref 0.0–0.2)

## 2024-04-16 LAB — RESP PANEL BY RT-PCR (RSV, FLU A&B, COVID)  RVPGX2
Influenza A by PCR: NEGATIVE
Influenza B by PCR: NEGATIVE
Resp Syncytial Virus by PCR: NEGATIVE
SARS Coronavirus 2 by RT PCR: NEGATIVE

## 2024-04-16 LAB — D-DIMER, QUANTITATIVE: D-Dimer, Quant: 0.48 ug{FEU}/mL (ref 0.00–0.50)

## 2024-04-16 LAB — MAGNESIUM: Magnesium: 1.9 mg/dL (ref 1.7–2.4)

## 2024-04-16 LAB — TROPONIN I (HIGH SENSITIVITY): Troponin I (High Sensitivity): 10 ng/L (ref <18)

## 2024-04-16 MED ORDER — DILTIAZEM LOAD VIA INFUSION
10.0000 mg | Freq: Once | INTRAVENOUS | Status: AC
  Administered 2024-04-16: 10 mg via INTRAVENOUS
  Filled 2024-04-16: qty 10

## 2024-04-16 MED ORDER — DILTIAZEM HCL-DEXTROSE 125-5 MG/125ML-% IV SOLN (PREMIX)
5.0000 mg/h | INTRAVENOUS | Status: DC
  Administered 2024-04-16: 5 mg/h via INTRAVENOUS
  Administered 2024-04-16: 12.5 mg/h via INTRAVENOUS
  Administered 2024-04-17 – 2024-04-18 (×4): 15 mg/h via INTRAVENOUS
  Filled 2024-04-16 (×7): qty 125

## 2024-04-16 MED ORDER — NITROGLYCERIN 0.4 MG SL SUBL
SUBLINGUAL_TABLET | SUBLINGUAL | Status: AC
Start: 2024-04-16 — End: 2024-04-16
  Administered 2024-04-16: 0.4 mg
  Filled 2024-04-16: qty 1

## 2024-04-16 MED ORDER — METOPROLOL TARTRATE 5 MG/5ML IV SOLN
10.0000 mg | Freq: Once | INTRAVENOUS | Status: AC
  Administered 2024-04-16: 10 mg via INTRAVENOUS
  Filled 2024-04-16: qty 10

## 2024-04-16 MED ORDER — APIXABAN 5 MG PO TABS
5.0000 mg | ORAL_TABLET | Freq: Two times a day (BID) | ORAL | Status: DC
  Administered 2024-04-16 – 2024-04-18 (×4): 5 mg via ORAL
  Filled 2024-04-16 (×4): qty 1

## 2024-04-16 MED ORDER — SODIUM CHLORIDE 0.9% FLUSH
3.0000 mL | Freq: Two times a day (BID) | INTRAVENOUS | Status: DC
  Administered 2024-04-16 – 2024-04-18 (×4): 3 mL via INTRAVENOUS

## 2024-04-16 MED ORDER — LORAZEPAM 0.5 MG PO TABS
0.5000 mg | ORAL_TABLET | Freq: Once | ORAL | Status: AC | PRN
  Administered 2024-04-16: 0.5 mg via ORAL
  Filled 2024-04-16: qty 1

## 2024-04-16 MED ORDER — POLYETHYLENE GLYCOL 3350 17 G PO PACK: 17.0000 g | PACK | Freq: Every day | ORAL | Status: DC | PRN

## 2024-04-16 MED ORDER — SODIUM CHLORIDE 0.9 % IV BOLUS
1000.0000 mL | Freq: Once | INTRAVENOUS | Status: AC
  Administered 2024-04-16: 1000 mL via INTRAVENOUS

## 2024-04-16 MED ORDER — ACETAMINOPHEN 325 MG PO TABS
650.0000 mg | ORAL_TABLET | Freq: Four times a day (QID) | ORAL | Status: DC | PRN
  Administered 2024-04-16 – 2024-04-17 (×6): 650 mg via ORAL
  Filled 2024-04-16 (×6): qty 2

## 2024-04-16 NOTE — Progress Notes (Signed)
 Hospitalist Transfer Note:    Nursing staff, Please call [facility] Admits & Consults System-Wide number on Amion [phone]) as soon as patient's arrival, so appropriate admitting provider can evaluate the pt.   Transferring facility: [facility] Requesting provider: Dr. Charmaine Bough (EDP at [facility]) Reason for transfer: admission for further evaluation and management of new diagnosis of atrial fibrillation with RVR.    56 F w/ distant history of PE, who presented to [facility] ED complaining of  3-4 days of shortness of breath associated with palpitations, generalized weakness.  Denies any associated chest pain.  No known history of atrial fibrillation, nor any history of congestive heart failure, although no prior echocardiogram on file.  Upon arrival [facility] found to be in atrial fibrillation with RVR, with initial heart rates noted to be in the 150s.  Heart rate transiently improved with dose of IV Lopressor.  Subsequently, diltiazem drip has been initiated, with heart rate improving, now into the 1 teens to 130's bpm.   Additional Vital signs in the ED were notable for the following: Afebrile; systolic blood pressures in the 120s to 150s; respiratory rate 15-20, and oxygen saturation 98 to 100% on room air.  Labs were notable for D-dimer 0.48; COVID, influenza, RSV PCR were all negative.   Imaging notable for 1 view chest x-ray, which showed no evidence of acute cardiopulmonary process.   Subsequently, I accepted this patient for transfer for observation to a pcu bed at [facility] for further work-up and management of the above.      Yvonne Pore, DO Hospitalist

## 2024-04-16 NOTE — ED Provider Notes (Signed)
 [place] EMERGENCY DEPARTMENT AT [facility] Provider Note   CSN: [id number] Arrival date & time: 04/16/24  9944     Patient presents with: Weakness and Shortness of Breath   Yvonne Ali is a 43 y.o. female.   HPI     This is a 43 year old female who presents with generalized weakness and shortness of breath.  States that she has felt more tired than usual and has felt short of breath.  She has also had some nausea.  No fevers but has felt chilled.  She reports overall soreness and weakness.  Has a history of hypertension but has not been taking any medications.  Denies chest pain.  She has felt palpitations during this time.  No known history of arrhythmia.  Prior to Admission medications   Medication Sig Start Date End Date Taking? Authorizing Provider  hydrochlorothiazide  (HYDRODIURIL ) 12.5 MG tablet Take 1 tablet (12.5 mg total) by mouth daily. 04/12/23   Georgina Speaks, FNP  Insulin  Pen Needle (PEN NEEDLES) 32G X 4 MM MISC Use once daily. 11/29/21   Moore, Janece, FNP  meclizine (ANTIVERT) 25 MG tablet Take 1 tablet (25 mg total) by mouth 3 (three) times daily as needed for dizziness. 03/16/24   Roselyn Carlin NOVAK, MD  Semaglutide -Weight Management (WEGOVY ) 0.5 MG/0.5ML SOAJ Inject 0.5 mg into the skin once a week. 07/20/23   Moore, Janece, FNP  triamcinolone cream (KENALOG) 0.1 % Apply 1 Application topically 2 (two) times daily. 03/16/24   Roselyn Carlin NOVAK, MD    Allergies: Patient has no known allergies.    Review of Systems  Constitutional:  Positive for fatigue. Negative for fever.  Respiratory:  Positive for shortness of breath. Negative for cough.   Cardiovascular:  Positive for palpitations. Negative for chest pain and leg swelling.  Gastrointestinal:  Positive for nausea. Negative for abdominal pain.  All other systems reviewed and are negative.   Updated Vital Signs BP (!) 125/108   Pulse (!) 158   Temp 97.9 F (36.6 C) (Oral)   Resp 19   LMP  03/20/2024 (Approximate)   SpO2 100%   Physical Exam Vitals and nursing note reviewed.  Constitutional:      Appearance: She is well-developed. She is obese. She is not ill-appearing.  HENT:     Head: Normocephalic and atraumatic.  Eyes:     Pupils: Pupils are equal, round, and reactive to light.  Cardiovascular:     Rate and Rhythm: Tachycardia present. Rhythm irregular.     Heart sounds: Normal heart sounds.  Pulmonary:     Effort: Pulmonary effort is normal. No respiratory distress.     Breath sounds: No wheezing.  Abdominal:     General: Bowel sounds are normal.     Palpations: Abdomen is soft.  Musculoskeletal:     Cervical back: Neck supple.     Right lower leg: No tenderness. No edema.     Left lower leg: No tenderness. No edema.  Skin:    General: Skin is warm and dry.  Neurological:     Mental Status: She is alert and oriented to person, place, and time.  Psychiatric:        Mood and Affect: Mood normal.     (all labs ordered are listed, but only abnormal results are displayed) Labs Reviewed  BASIC METABOLIC PANEL WITH GFR - Abnormal; Notable for the following components:      Result Value   Glucose, Bld 113 (*)    Creatinine, Ser  1.02 (*)    All other components within normal limits  CBC - Abnormal; Notable for the following components:   Hemoglobin 10.6 (*)    MCV 75.7 (*)    MCH 22.2 (*)    MCHC 29.3 (*)    RDW 17.4 (*)    Platelets 444 (*)    All other components within normal limits  RESP PANEL BY RT-PCR (RSV, FLU A&B, COVID)  RVPGX2  MAGNESIUM   D-DIMER, QUANTITATIVE  HCG, QUANTITATIVE, PREGNANCY  PREGNANCY, URINE    EKG: EKG Interpretation Date/Time:  Wednesday April 16 2024 01:18:17 EST Ventricular Rate:  146 PR Interval:    QRS Duration:  80 QT Interval:  290 QTC Calculation: 452 R Axis:   77  Text Interpretation: Atrial fibrillation Paired ventricular premature complexes Borderline T abnormalities, inferior leads Confirmed by  Bari Pfeiffer ([id number]) on 04/16/2024 2:16:18 AM  Radiology: [facility] Chest Portable 1 View Result Date: 04/16/2024 CLINICAL DATA:  Weakness and shortness of breath. EXAM: PORTABLE CHEST 1 VIEW COMPARISON:  July 22, 2013 FINDINGS: The heart size and mediastinal contours are within normal limits. Both lungs are clear. The visualized skeletal structures are unremarkable. IMPRESSION: No active disease. Electronically Signed   By: Suzen Dials M.D.   On: 04/16/2024 01:55     .Critical Care  Performed by: Bari Pfeiffer FALCON, MD Authorized by: Bari Pfeiffer FALCON, MD   Critical care provider statement:    Critical care time (minutes):  40   Critical care was necessary to treat or prevent imminent or life-threatening deterioration of the following conditions: atrial fibrillation.   Critical care was time spent personally by me on the following activities:  Development of treatment plan with patient or surrogate, discussions with consultants, evaluation of patient's response to treatment, examination of patient, ordering and review of laboratory studies, ordering and review of radiographic studies, ordering and performing treatments and interventions, pulse oximetry, re-evaluation of patient's condition and review of old charts    Medications Ordered in the ED  diltiazem (CARDIZEM) 1 mg/mL load via infusion 10 mg (has no administration in time range)    And  diltiazem (CARDIZEM) 125 mg in dextrose  5% 125 mL (1 mg/mL) infusion (has no administration in time range)  metoprolol tartrate (LOPRESSOR) injection 10 mg (10 mg Intravenous Given 04/16/24 0153)  sodium chloride  0.9 % bolus 1,000 mL (1,000 mLs Intravenous New Bag/Given 04/16/24 0153)                                    Medical Decision Making Amount and/or Complexity of Data Reviewed Labs: ordered. Radiology: ordered.  Risk Prescription drug management. Decision regarding hospitalization.   This patient presents to the ED for  concern of shortness of breath, fatigue, this involves an extensive number of treatment options, and is a complaint that carries with it a high risk of complications and morbidity.  I considered the following differential and admission for this acute, potentially life threatening condition.  The differential diagnosis includes viral illness such as COVID or influenza, ACS, CHF, arrhythmia, metabolic derangement  MDM:    This is a 43 year old female who presents with generalized weakness, shortness of breath, fatigue.  She is overall nontoxic-appearing and vital signs are notable for a pulse of 158.  She appears to be in atrial fibrillation with RVR.  She has had no known prior diagnoses of this.  This is a potential culprit for why she  has felt fatigued and short of breath.  She also reports some upper respiratory symptoms.  COVID and flu are negative.  Chest x-ray without evidence of pulmonary edema, pneumonia, pneumothorax.  No significant metabolic derangements.  D-dimer is negative and have low suspicion for PE.  Patient was given a liter of fluids and a dose of metoprolol.  She had some transient improvement of heart rate but was persistently tachycardic in the 120s.  Was started on a diltiazem drip.  Will plan for admission for new onset atrial fibrillation.  Will likely need echocardiogram and cardiology evaluation.  (Labs, imaging, consults)  Labs: I Ordered, and personally interpreted labs.  The pertinent results include: CBC, BMP, magnesium , COVID, influenza, D-dimer  Imaging Studies ordered: I ordered imaging studies including chest x-ray I independently visualized and interpreted imaging. I agree with the radiologist interpretation  Additional history obtained from chart review.  External records from outside source obtained and reviewed including prior evaluations  Cardiac Monitoring: The patient was maintained on a cardiac monitor.  If on the cardiac monitor, I personally viewed and  interpreted the cardiac monitored which showed an underlying rhythm of: Atrial fibrillation  Reevaluation: After the interventions noted above, I reevaluated the patient and found that they have :stayed the same  Social Determinants of Health:  lives independently  Disposition: Admit  Co morbidities that complicate the patient evaluation  Past Medical History:  Diagnosis Date   Acute blood loss anemia 02/06/2015   Ectopic pregnancy    Gestational hypertension, antepartum 02/03/2015   Iron  deficiency anemia of pregnancy 02/06/2015   PE (pulmonary embolism)    Postpartum care following vaginal delivery (2/2) 07/15/2013   Pregnancy induced hypertension      Medicines Meds ordered this encounter  Medications   metoprolol tartrate (LOPRESSOR) injection 10 mg   sodium chloride  0.9 % bolus 1,000 mL   AND Linked Order Group    diltiazem (CARDIZEM) 1 mg/mL load via infusion 10 mg    diltiazem (CARDIZEM) 125 mg in dextrose  5% 125 mL (1 mg/mL) infusion    I have reviewed the patients home medicines and have made adjustments as needed  Problem List / ED Course: Problem List Items Addressed This Visit   None Visit Diagnoses       New onset atrial fibrillation (HCC)    -  Primary   Relevant Medications   metoprolol tartrate (LOPRESSOR) injection 10 mg (Completed)   diltiazem (CARDIZEM) 1 mg/mL load via infusion 10 mg (Start on 04/16/2024  2:45 AM)   diltiazem (CARDIZEM) 125 mg in dextrose  5% 125 mL (1 mg/mL) infusion (Start on 04/16/2024  2:45 AM)                                Final diagnoses:  New onset atrial fibrillation [facility])    ED Discharge Orders          Ordered    Amb referral to AFIB Clinic        04/16/24 0127               Bari Charmaine FALCON, MD 04/16/24 [phone]

## 2024-04-16 NOTE — Consult Note (Signed)
 CARDIOLOGY CONSULT NOTE       Patient ID: CURSTIN SCHMALE MRN: [id number] DOB/AGE: September 02, 1980 43 y.o.  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 Admitted with fatigue and dyspnea. Found to be in rapid afib. She is over weight with OSA. She has not been on CPAP for over 15 years Did not tolerate it. No bleeding diathesis and first dose of eliquis is tonight. No thyroid studies in chart. No chest pain or syncope. ECG with afib and nonspecific ST changes no pre excitation. She is supposed to be on hydrochlorothiazide  for HTN but has not taken any in 30 days. 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 Has had prior gastric sleeve but no dysphagia or other esophageal issues   Hct 36.2 PLT 444K 3.6 Cr 1.02  ROS All other systems reviewed and negative except as noted above  Past Medical History:  Diagnosis Date   Acute blood loss anemia 02/06/2015   COVID-19 vaccination declined 04/12/2023   Ectopic pregnancy    Encounter for screening mammogram for breast cancer 04/12/2023   Gestational hypertension, antepartum 02/03/2015   Iron  deficiency anemia of pregnancy 02/06/2015   PE (pulmonary embolism)    Postpartum care following vaginal delivery (2/2) 07/15/2013   Pregnancy induced hypertension    SVD (spontaneous vaginal delivery) 07/22/2018    Family History  Problem Relation Age of Onset   Breast cancer Mother 46   Cancer Mother    Hypertension Maternal Grandfather    Hyperlipidemia Maternal Grandfather    Diabetes Paternal  Grandmother    Ovarian cancer Paternal Grandmother 74   BRCA 1/2 Neg Hx     Social History   Socioeconomic History   Marital status: Married    Spouse name: Not on file   Number of children: Not on file   Years of education: Not on file   Highest education level: Associate degree: occupational, scientist, product/process development, or vocational program  Occupational History   Not on file  Tobacco Use   Smoking status: Former    Current packs/day: 0.00    Types: Cigarettes    Quit date: 05/12/2006    Years since quitting: 17.9   Smokeless tobacco: Never   Tobacco comments:    quit smoking 8 years ago  Vaping Use   Vaping status: Never Used  Substance and Sexual Activity   Alcohol use: Yes    Comment: stopped with positive UPT   Drug use: No   Sexual activity: Yes    Partners: Male    Birth control/protection: None  Other Topics Concern   Not on file  Social History Narrative   Not on file   Social Drivers of Health   Financial Resource Strain: Low Risk  (04/11/2023)   Overall Financial Resource Strain (CARDIA)    Difficulty of Paying Living Expenses: Not hard at all  Food Insecurity: No Food Insecurity (04/16/2024)   Hunger Vital Sign    Worried About Running Out of Food in the Last Year: Never true    Ran Out of Food in the  Last Year: Never true  Transportation Needs: No Transportation Needs (04/16/2024)   PRAPARE - Administrator, Civil Service (Medical): No    Lack of Transportation (Non-Medical): No  Physical Activity: Unknown (04/11/2023)   Exercise Vital Sign    Days of Exercise per Week: 0 days    Minutes of Exercise per Session: Not on file  Stress: No Stress Concern Present (04/11/2023)   [employer] of Occupational Health - Occupational Stress Questionnaire    Feeling of Stress : Not at all  Social Connections: Unknown (04/11/2023)   Social Connection and Isolation Panel    Frequency of Communication with Friends and Family: Three times a week    Frequency of  Social Gatherings with Friends and Family: Not on file    Attends Religious Services: Not on file    Active Member of Clubs or Organizations: No    Attends Banker Meetings: Not on file    Marital Status: Married  Intimate Partner Violence: Not At Risk (04/16/2024)   Humiliation, Afraid, Rape, and Kick questionnaire    Fear of Current or Ex-Partner: No    Emotionally Abused: No    Physically Abused: No    Sexually Abused: No    Past Surgical History:  Procedure Laterality Date   BARIATRIC SURGERY     gastric sleeve   HERNIA REPAIR  01/22/2022   HIP SURGERY        Current Facility-Administered Medications:    acetaminophen  (TYLENOL ) tablet 650 mg, 650 mg, Oral, Q6H PRN, Kingsley, Victoria K, DO, 650 mg at 04/16/24 1513   apixaban (ELIQUIS) tablet 5 mg, 5 mg, Oral, BID, Melvin, Alexander B, MD   [COMPLETED] diltiazem (CARDIZEM) 1 mg/mL load via infusion 10 mg, 10 mg, Intravenous, Once, 10 mg at 04/16/24 0309 **AND** diltiazem (CARDIZEM) 125 mg in dextrose  5% 125 mL (1 mg/mL) infusion, 5-15 mg/hr, Intravenous, Continuous, Melvin, Alexander B, MD, Last Rate: 12.5 mL/hr at 04/16/24 1558, 12.5 mg/hr at 04/16/24 1558   polyethylene glycol (MIRALAX / GLYCOLAX) packet 17 g, 17 g, Oral, Daily PRN, Melvin, Alexander B, MD   sodium chloride  flush (NS) 0.9 % injection 3 mL, 3 mL, Intravenous, Q12H, Melvin, Alexander B, MD  apixaban  5 mg Oral BID   sodium chloride  flush  3 mL Intravenous Q12H    diltiazem (CARDIZEM) infusion 12.5 mg/hr (04/16/24 1558)    Physical Exam: Blood pressure (!) 138/97, pulse (!) 123, temperature 98.7 F (37.1 C), temperature source Oral, resp. rate 15, height 5' 10 (1.778 m), weight (!) 161.7 kg, last menstrual period 03/20/2024, SpO2 98%.    Obese black female Lungs clear Distant heart sounds Abdomen benign Trace bilateral edema  Labs:   Lab Results  Component Value Date   WBC 6.8 04/16/2024   HGB 10.6 (L) 04/16/2024   HCT 36.2 04/16/2024    MCV 75.7 (L) 04/16/2024   PLT 444 (H) 04/16/2024    Recent Labs  Lab 04/16/24 0126  NA 138  K 3.6  CL 104  CO2 22  BUN 10  CREATININE 1.02*  CALCIUM  9.3  GLUCOSE 113*   No results found for: CKTOTAL, CKMB, CKMBINDEX, TROPONINI  Lab Results  Component Value Date   CHOL 201 (H) 04/12/2023   CHOL 173 02/23/2021   CHOL 201 (A) 11/22/2017   Lab Results  Component Value Date   HDL 71 04/12/2023   HDL 55 02/23/2021   HDL 67 11/22/2017   Lab Results  Component Value Date  LDLCALC 116 (H) 04/12/2023   LDLCALC 106 (H) 02/23/2021   LDLCALC 118 11/22/2017   Lab Results  Component Value Date   TRIG 76 04/12/2023   TRIG 62 02/23/2021   TRIG 79 11/22/2017   Lab Results  Component Value Date   CHOLHDL 2.8 04/12/2023   CHOLHDL 3.1 02/23/2021   No results found for: LDLDIRECT    Radiology: DG Chest Portable 1 View Result Date: 04/16/2024 CLINICAL DATA:  Weakness and shortness of breath. EXAM: PORTABLE CHEST 1 VIEW COMPARISON:  July 22, 2013 FINDINGS: The heart size and mediastinal contours are within normal limits. Both lungs are clear. The visualized skeletal structures are unremarkable. IMPRESSION: No active disease. Electronically Signed   By: Suzen Dials M.D.   On: 04/16/2024 01:55    EKG: afib non specific ST changes   ASSESSMENT AND PLAN:   PAF:  continue iv cardizem over night Add lopressor 25 mg bid. Continue eliquis. Long discussion with patient, sister and nephew about rate control, stroke prevention, and conversion. I favor TEE/DCC Friday. She wants to think about it. Risk of intubation, stroke, hypotension and need for TMP discussed. Echo was not done today due to high HR;s. Hopefully can be done tomorrow Will check TSH/T4.   OSA:  on d/c will have her see Dr Shlomo I believe her weight is too high for Inspiris device HTN:  non compliant with diuretic lopressor/cardizem should help  Signed: Maude Emmer 04/16/2024, 5:04 PM

## 2024-04-16 NOTE — TOC CM/SW Note (Signed)
 Transition of Care [facility]) - Inpatient Brief Assessment   Patient Details  Name: CAMMY SANJURJO MRN: [id number] Date of Birth: [birth year]  Transition of Care [facility]) CM/SW Contact:    Lauraine FORBES Saa, LCSWA Phone Number: 04/16/2024, 4:27 PM   Clinical Narrative:  4:27 PM Per chart review, patient resides at home with spouse and child(ren). Patient has a PCP and insurance. Patient does not have SNF/HH/DME history. Patient's preferred pharmacy's are [facility], [facility], and [facility]. No TOC needs identified at this time. TOC will continue to follow.  Transition of Care Asessment: Insurance and Status: Insurance coverage has been reviewed Patient has primary care physician: Yes Home environment has been reviewed: Private Residence Prior level of function:: N/A Prior/Current Home Services: No current home services Social Drivers of Health Review: SDOH reviewed no interventions necessary Readmission risk has been reviewed: Yes (Currently Observation Status) Transition of care needs: no transition of care needs at this time

## 2024-04-16 NOTE — H&P (Signed)
 History and Physical   Yvonne Ali FMW:[id number] DOB: May 12, 1981 DOA: 04/16/2024  PCP: Georgina Speaks, FNP   Patient coming from: Home  Chief Complaint: Shortness of breath, weakness  HPI: Yvonne Ali is a 43 y.o. female with medical history significant of hypertension, obesity, PE, protein S deficiency, status post sleeve gastrectomy presented with shortness of breath and weakness.  Patient reporting generalized weakness and shortness of breath for the past 3 days.  Also reports of associated nausea and some mild chills but no fever.  Denies chest pain, abdominal pain, constipation, diarrhea.  ED Course: Vital signs in the ED notable for blood pressure in the 120s-150s systolic.  Heart rate in the 50s-150s.  Lab workup included BMP with glucose 113.  Magnesium  normal.  CBC with hemoglobin stable at 10.6, platelets 444.  D-dimer negative.  Respiratory panel for flu COVID and RSV negative.  Chest x-ray showed no acute normality.  Patient received in the Tylenol , IV metoprolol and was started on a diltiazem infusion.  Also received 1 L IV fluids.  Patient accepted to progressive unit overnight.  Review of Systems: As per HPI otherwise all other systems reviewed and are negative.  Past Medical History:  Diagnosis Date   Acute blood loss anemia 02/06/2015   COVID-19 vaccination declined 04/12/2023   Ectopic pregnancy    Encounter for screening mammogram for breast cancer 04/12/2023   Gestational hypertension, antepartum 02/03/2015   Iron  deficiency anemia of pregnancy 02/06/2015   PE (pulmonary embolism)    Postpartum care following vaginal delivery (2/2) 07/15/2013   Pregnancy induced hypertension    SVD (spontaneous vaginal delivery) 07/22/2018    Past Surgical History:  Procedure Laterality Date   BARIATRIC SURGERY     gastric sleeve   HERNIA REPAIR  01/22/2022   HIP SURGERY      Social History  reports that Yvonne Ali quit smoking about 17 years ago. Her smoking use  included cigarettes. Yvonne Ali has never used smokeless tobacco. Yvonne Ali reports current alcohol use. Yvonne Ali reports that Yvonne Ali does not use drugs.  No Known Allergies  Family History  Problem Relation Age of Onset   Breast cancer Mother 21   Cancer Mother    Hypertension Maternal Grandfather    Hyperlipidemia Maternal Grandfather    Diabetes Paternal Grandmother    Ovarian cancer Paternal Grandmother 22   BRCA 1/2 Neg Hx   Reviewed on admission  Prior to Admission medications   Medication Sig Start Date End Date Taking? Authorizing Provider  acetaminophen  (TYLENOL ) 500 MG tablet Take 500 mg by mouth every 6 (six) hours as needed for headache or fever (pain).   Yes [provider]  ibuprofen  (ADVIL ) 200 MG tablet Take 400 mg by mouth every 6 (six) hours as needed for headache, fever or cramping (pain).   Yes [provider]  meclizine (ANTIVERT) 25 MG tablet Take 1 tablet (25 mg total) by mouth 3 (three) times daily as needed for dizziness. 03/16/24  Yes Roselyn Carlin NOVAK, MD  Multiple Vitamins-Minerals (MULTIVITAMIN WOMEN) TABS Take 1 tablet by mouth daily.   Yes [provider]  triamcinolone cream (KENALOG) 0.1 % Apply 1 Application topically 2 (two) times daily. Patient taking differently: Apply 1 Application topically 2 (two) times daily as needed (skin irritation). 03/16/24  Yes Roselyn Carlin NOVAK, MD  hydrochlorothiazide  (HYDRODIURIL ) 12.5 MG tablet Take 12.5 mg by mouth daily. Patient not taking: Reported on 04/16/2024    [provider]    Physical Exam: Vitals:  04/16/24 0714 04/16/24 1100 04/16/24 1438 04/16/24 1501  BP:  (!) 132/100  (!) 138/97  Pulse: 88 97  (!) 123  Resp:  12  15  Temp:  97.8 F (36.6 C)  98.7 F (37.1 C)  TempSrc:  Oral  Oral  SpO2:  100%  98%  Weight:   (!) 161.7 kg   Height:   5' 10 (1.778 m)     Physical Exam Constitutional:      General: Yvonne Ali is not in acute distress.    Appearance: Normal appearance. Yvonne Ali is obese.   HENT:     Head: Normocephalic and atraumatic.     Mouth/Throat:     Mouth: Mucous membranes are moist.     Pharynx: Oropharynx is clear.  Eyes:     Extraocular Movements: Extraocular movements intact.     Pupils: Pupils are equal, round, and reactive to light.  Cardiovascular:     Rate and Rhythm: Tachycardia present. Rhythm irregular.     Pulses: Normal pulses.     Heart sounds: Normal heart sounds.  Pulmonary:     Effort: Pulmonary effort is normal. No respiratory distress.     Breath sounds: Normal breath sounds.  Abdominal:     General: Bowel sounds are normal. There is no distension.     Palpations: Abdomen is soft.     Tenderness: There is no abdominal tenderness.  Musculoskeletal:        General: No swelling or deformity.  Skin:    General: Skin is warm and dry.  Neurological:     General: No focal deficit present.     Mental Status: Mental status is at baseline.    Labs on Admission: I have personally reviewed following labs and imaging studies  CBC: Recent Labs  Lab 04/16/24 0126  WBC 6.8  HGB 10.6*  HCT 36.2  MCV 75.7*  PLT 444*    Basic Metabolic Panel: Recent Labs  Lab 04/16/24 0126  NA 138  K 3.6  CL 104  CO2 22  GLUCOSE 113*  BUN 10  CREATININE 1.02*  CALCIUM  9.3  MG 1.9    GFR: Estimated Creatinine Clearance: 121.2 mL/min (A) (by C-G formula based on SCr of 1.02 mg/dL (H)).  Liver Function Tests: No results for input(s): AST, ALT, ALKPHOS, BILITOT, PROT, ALBUMIN in the last 168 hours.  Urine analysis:    Component Value Date/Time   COLORURINE COLORLESS (A) 03/16/2024 0828   APPEARANCEUR CLEAR 03/16/2024 0828   LABSPEC 1.011 03/16/2024 0828   PHURINE 6.0 03/16/2024 0828   GLUCOSEU NEGATIVE 03/16/2024 0828   HGBUR NEGATIVE 03/16/2024 0828   BILIRUBINUR NEGATIVE 03/16/2024 0828   BILIRUBINUR negative 04/12/2023 0812   BILIRUBINUR Negative 02/23/2021 1015   KETONESUR NEGATIVE 03/16/2024 0828   PROTEINUR NEGATIVE  03/16/2024 0828   UROBILINOGEN 0.2 04/12/2023 0812   UROBILINOGEN 2.0 (H) 09/26/2021 1528   NITRITE NEGATIVE 03/16/2024 0828   LEUKOCYTESUR NEGATIVE 03/16/2024 0828    Radiological Exams on Admission: DG Chest Portable 1 View Result Date: 04/16/2024 CLINICAL DATA:  Weakness and shortness of breath. EXAM: PORTABLE CHEST 1 VIEW COMPARISON:  July 22, 2013 FINDINGS: The heart size and mediastinal contours are within normal limits. Both lungs are clear. The visualized skeletal structures are unremarkable. IMPRESSION: No active disease. Electronically Signed   By: Suzen Dials M.D.   On: 04/16/2024 01:55   EKG: Independently reviewed.  Atrial fibrillation with RVR at 146 bpm.  Nonspecific T wave changes.  Assessment/Plan Principal Problem:  Atrial flutter with rapid ventricular response (HCC) Active Problems:   History of pulmonary embolus (PE)   Essential hypertension   Protein S deficiency   Class 3 severe obesity due to excess calories without serious comorbidity with body mass index (BMI) of 50.0 to 59.9 in adult [facility])   New onset atrial fibrillation with RVR > Patient presenting with new onset A-fib with RVR.  Had had 3 days of fatigue and shortness of breath preceding. > Started on diltiazem drip in the ED with some improvement. > History of prior PE and protein S deficiency, will start anticoagulation now. - Monitoring on progressive unit - Cardiology consult - Continue with diltiazem infusion - Eliquis - Echocardiogram - Supportive care  Hypertension - Not currently taking medication, will likely be on new regimen compared to previous with new onset A-fib.  Obesity History of sleeve gastrectomy - Noted  History of PE Protein S deficiency - Restarted on Eliquis as above  DVT prophylaxis: Eliquis Code Status:   Full Family Communication:  None on admission  Disposition Plan:   Patient is from:  Home  Anticipated DC to:  Home  Anticipated DC date:  1 to 2  days  Anticipated DC barriers: None  Consults called: Cardiology  Admission status:  Observation, progressive  Severity of Illness: The appropriate patient status for this patient is OBSERVATION. Observation status is judged to be reasonable and necessary in order to provide the required intensity of service to ensure the patient's safety. The patient's presenting symptoms, physical exam findings, and initial radiographic and laboratory data in the context of their medical condition is felt to place them at decreased risk for further clinical deterioration. Furthermore, it is anticipated that the patient will be medically stable for discharge from the hospital within 2 midnights of admission.    Marsa KATHEE Scurry MD [facility]  How to contact the [facility] Attending or Consulting provider 7A - 7P or covering provider during after hours 7P -7A, for this patient?   Check the care team in [facility] and look for a) attending/consulting [facility] provider listed and b) the [facility] team listed Log into [email or web address] and use [place]'s universal password to access. If you do not have the password, please contact the hospital operator. Locate the [facility] provider you are looking for under [facility] and page to a number that you can be directly reached. If you still have difficulty reaching the provider, please page the [facility] (Director on Call) for the Hospitalists listed on amion for assistance.  04/16/2024, 3:12 PM

## 2024-04-16 NOTE — ED Notes (Incomplete)
 Called [facility] to transport the patient to [facility]

## 2024-04-16 NOTE — ED Notes (Signed)
 ED Provider at bedside.

## 2024-04-16 NOTE — Progress Notes (Signed)
Heart rate too high for accurate echo at this time. 

## 2024-04-16 NOTE — ED Triage Notes (Addendum)
  Patient comes in with weakness and SOB that has been going on since Sunday.  Patient states she has felt more tired than usual and SOB when ambulating.  States she has not had an appetite since Sunday.  Had nausea and made herself vomit on Monday in attempt to feel better.  Pain 7/10, extremities, soreness.   Patient states she has HTN and does not take her medication.  Last taken several weeks ago.  Took motrin  around 2130.

## 2024-04-17 ENCOUNTER — Other Ambulatory Visit (HOSPITAL_COMMUNITY): Payer: Self-pay

## 2024-04-17 ENCOUNTER — Encounter: Payer: Self-pay | Admitting: Internal Medicine

## 2024-04-17 ENCOUNTER — Observation Stay (HOSPITAL_COMMUNITY)

## 2024-04-17 DIAGNOSIS — I4891 Unspecified atrial fibrillation: Secondary | ICD-10-CM | POA: Diagnosis not present

## 2024-04-17 DIAGNOSIS — Z7901 Long term (current) use of anticoagulants: Secondary | ICD-10-CM | POA: Diagnosis not present

## 2024-04-17 DIAGNOSIS — I340 Nonrheumatic mitral (valve) insufficiency: Secondary | ICD-10-CM | POA: Diagnosis not present

## 2024-04-17 DIAGNOSIS — D509 Iron deficiency anemia, unspecified: Secondary | ICD-10-CM | POA: Diagnosis present

## 2024-04-17 DIAGNOSIS — I480 Paroxysmal atrial fibrillation: Secondary | ICD-10-CM | POA: Diagnosis present

## 2024-04-17 DIAGNOSIS — Z91148 Patient's other noncompliance with medication regimen for other reason: Secondary | ICD-10-CM | POA: Diagnosis not present

## 2024-04-17 DIAGNOSIS — I10 Essential (primary) hypertension: Secondary | ICD-10-CM | POA: Diagnosis present

## 2024-04-17 DIAGNOSIS — Z9884 Bariatric surgery status: Secondary | ICD-10-CM | POA: Diagnosis not present

## 2024-04-17 DIAGNOSIS — Z833 Family history of diabetes mellitus: Secondary | ICD-10-CM | POA: Diagnosis not present

## 2024-04-17 DIAGNOSIS — Z8349 Family history of other endocrine, nutritional and metabolic diseases: Secondary | ICD-10-CM | POA: Diagnosis not present

## 2024-04-17 DIAGNOSIS — Z79899 Other long term (current) drug therapy: Secondary | ICD-10-CM | POA: Diagnosis not present

## 2024-04-17 DIAGNOSIS — Z28310 Unvaccinated for covid-19: Secondary | ICD-10-CM | POA: Diagnosis not present

## 2024-04-17 DIAGNOSIS — Z6841 Body Mass Index (BMI) 40.0 and over, adult: Secondary | ICD-10-CM | POA: Diagnosis not present

## 2024-04-17 DIAGNOSIS — Z8041 Family history of malignant neoplasm of ovary: Secondary | ICD-10-CM | POA: Diagnosis not present

## 2024-04-17 DIAGNOSIS — Z87891 Personal history of nicotine dependence: Secondary | ICD-10-CM | POA: Diagnosis not present

## 2024-04-17 DIAGNOSIS — Z7985 Long-term (current) use of injectable non-insulin antidiabetic drugs: Secondary | ICD-10-CM | POA: Diagnosis not present

## 2024-04-17 DIAGNOSIS — D6859 Other primary thrombophilia: Secondary | ICD-10-CM | POA: Diagnosis present

## 2024-04-17 DIAGNOSIS — R0602 Shortness of breath: Secondary | ICD-10-CM | POA: Diagnosis present

## 2024-04-17 DIAGNOSIS — R946 Abnormal results of thyroid function studies: Secondary | ICD-10-CM | POA: Diagnosis present

## 2024-04-17 DIAGNOSIS — Z803 Family history of malignant neoplasm of breast: Secondary | ICD-10-CM | POA: Diagnosis not present

## 2024-04-17 DIAGNOSIS — Z8249 Family history of ischemic heart disease and other diseases of the circulatory system: Secondary | ICD-10-CM | POA: Diagnosis not present

## 2024-04-17 DIAGNOSIS — G4733 Obstructive sleep apnea (adult) (pediatric): Secondary | ICD-10-CM | POA: Diagnosis present

## 2024-04-17 DIAGNOSIS — E66813 Obesity, class 3: Secondary | ICD-10-CM | POA: Diagnosis present

## 2024-04-17 DIAGNOSIS — I4892 Unspecified atrial flutter: Secondary | ICD-10-CM | POA: Diagnosis present

## 2024-04-17 LAB — TSH: TSH: 0.341 u[IU]/mL — ABNORMAL LOW (ref 0.350–4.500)

## 2024-04-17 LAB — COMPREHENSIVE METABOLIC PANEL WITH GFR
ALT: 24 U/L (ref 0–44)
AST: 22 U/L (ref 15–41)
Albumin: 2.9 g/dL — ABNORMAL LOW (ref 3.5–5.0)
Alkaline Phosphatase: 55 U/L (ref 38–126)
Anion gap: 10 (ref 5–15)
BUN: 11 mg/dL (ref 6–20)
CO2: 20 mmol/L — ABNORMAL LOW (ref 22–32)
Calcium: 8.5 mg/dL — ABNORMAL LOW (ref 8.9–10.3)
Chloride: 107 mmol/L (ref 98–111)
Creatinine, Ser: 0.97 mg/dL (ref 0.44–1.00)
GFR, Estimated: >60 mL/min (ref >60)
Glucose, Bld: 126 mg/dL — ABNORMAL HIGH (ref 70–99)
Potassium: 3.6 mmol/L (ref 3.5–5.1)
Sodium: 137 mmol/L (ref 135–145)
Total Bilirubin: 0.6 mg/dL (ref 0.0–1.2)
Total Protein: 7 g/dL (ref 6.5–8.1)

## 2024-04-17 LAB — ECHOCARDIOGRAM COMPLETE
Height: 70 in
S' Lateral: 3.1 cm
Weight: 5703.74 [oz_av]

## 2024-04-17 LAB — CBC
HCT: 33.4 % — ABNORMAL LOW (ref 36.0–46.0)
Hemoglobin: 9.9 g/dL — ABNORMAL LOW (ref 12.0–15.0)
MCH: 22.3 pg — ABNORMAL LOW (ref 26.0–34.0)
MCHC: 29.6 g/dL — ABNORMAL LOW (ref 30.0–36.0)
MCV: 75.4 fL — ABNORMAL LOW (ref 80.0–100.0)
Platelets: 383 K/uL (ref 150–400)
RBC: 4.43 MIL/uL (ref 3.87–5.11)
RDW: 17.4 % — ABNORMAL HIGH (ref 11.5–15.5)
WBC: 5.1 K/uL (ref 4.0–10.5)
nRBC: 0 % (ref 0.0–0.2)

## 2024-04-17 LAB — TROPONIN I (HIGH SENSITIVITY): Troponin I (High Sensitivity): 12 ng/L (ref <18)

## 2024-04-17 LAB — HIV ANTIBODY (ROUTINE TESTING W REFLEX): HIV Screen 4th Generation wRfx: NONREACTIVE

## 2024-04-17 MED ORDER — ALUM & MAG HYDROXIDE-SIMETH 200-200-20 MG/5ML PO SUSP
30.0000 mL | Freq: Once | ORAL | Status: AC
  Administered 2024-04-17: 30 mL via ORAL
  Filled 2024-04-17: qty 30

## 2024-04-17 MED ORDER — SODIUM CHLORIDE 0.9 % IV SOLN: INTRAVENOUS | Status: DC

## 2024-04-17 MED ORDER — LORAZEPAM 0.5 MG PO TABS
0.5000 mg | ORAL_TABLET | Freq: Every evening | ORAL | Status: AC | PRN
  Administered 2024-04-17: 0.5 mg via ORAL
  Filled 2024-04-17: qty 1

## 2024-04-17 MED ORDER — POTASSIUM CHLORIDE CRYS ER 20 MEQ PO TBCR
40.0000 meq | EXTENDED_RELEASE_TABLET | Freq: Once | ORAL | Status: AC
  Administered 2024-04-17: 40 meq via ORAL
  Filled 2024-04-17: qty 2

## 2024-04-17 MED ORDER — BUTALBITAL-APAP-CAFFEINE 50-325-40 MG PO TABS
1.0000 | ORAL_TABLET | Freq: Four times a day (QID) | ORAL | Status: DC | PRN
  Administered 2024-04-17 (×2): 1 via ORAL
  Filled 2024-04-17 (×3): qty 1

## 2024-04-17 MED ORDER — ALUM & MAG HYDROXIDE-SIMETH 200-200-20 MG/5ML PO SUSP
15.0000 mL | Freq: Four times a day (QID) | ORAL | Status: DC | PRN
  Administered 2024-04-17: 15 mL via ORAL
  Filled 2024-04-17: qty 30

## 2024-04-17 MED ORDER — MAGNESIUM SULFATE 2 GM/50ML IV SOLN
2.0000 g | Freq: Once | INTRAVENOUS | Status: AC
  Administered 2024-04-17: 2 g via INTRAVENOUS
  Filled 2024-04-17: qty 50

## 2024-04-17 NOTE — H&P (View-Only) (Signed)
  Progress Note  Patient Name: Yvonne Ali Date of Encounter: 04/17/2024 [facility] Cardiologist: None   Interval Summary   Reports feeling okay this morning HR still rises with activity  Wanting to proceed with TEE/DCCV tomorrow   Vital Signs Vitals:   04/16/24 2123 04/16/24 2315 04/17/24 0514 04/17/24 0731  BP: (!) 140/101 127/83 127/83 124/83  Pulse:  76 73 75  Resp:  18 18 20   Temp:  98.8 F (37.1 C) 98.1 F (36.7 C) 97.9 F (36.6 C)  TempSrc:  Oral Oral Oral  SpO2: 100% 99% 98% 98%  Weight:      Height:        Intake/Output Summary (Last 24 hours) at 04/17/2024 0813 Last data filed at 04/17/2024 0500 Gross per 24 hour  Intake 300.62 ml  Output --  Net 300.62 ml      04/16/2024    2:38 PM 03/16/2024    7:28 AM 04/12/2023    8:59 AM  Last 3 Weights  Weight (lbs) 356 lb 7.7 oz 350 lb 356 lb 6.4 oz  Weight (kg) 161.7 kg 158.759 kg 161.662 kg     Telemetry/ECG  A-Fib, HR 90-110s - Personally Reviewed  Physical Exam  GEN: No acute distress.   Neck: No JVD Cardiac: iRRR, distant heart sounds.  Respiratory: Clear to auscultation bilaterally. GI: Soft, nontender, non-distended  MS: mild edema  Assessment & Plan   Paroxysmal A-Fib No previous know history of PAF Reported fatigue, shortness of breath x 3 days Started on IV diltiazem  Currently in A-Fib with HR 90-110s  Continue Eliquis 5 mg BID Continue IV diltiazem, currently at 15 mL/hr Scheduled for TEE/DCCV on Friday NPO at midnight  OSA not on CPAP Previously given CPAP  Not able to tolerate  Plan for patient to see Dr. Shlomo as outpatient to discuss other options  Hypertension  Previously noncompliant with hydrochlorothiazide  Started on IV diltiazem for A-Fib BP controlled this AM   For questions or updates, please contact [facility] Please consult [email or web address] for contact info under         Signed, Waddell DELENA Donath, PA-C

## 2024-04-17 NOTE — Plan of Care (Signed)

## 2024-04-17 NOTE — Progress Notes (Signed)
  Echocardiogram 2D Echocardiogram has been performed.  Yvonne Ali 04/17/2024, 11:53 AM

## 2024-04-17 NOTE — Discharge Instructions (Signed)

## 2024-04-17 NOTE — Plan of Care (Signed)

## 2024-04-17 NOTE — Progress Notes (Signed)
  Progress Note  Patient Name: Yvonne Ali Date of Encounter: 04/17/2024 [facility] Cardiologist: None   Interval Summary   Reports feeling okay this morning HR still rises with activity  Wanting to proceed with TEE/DCCV tomorrow   Vital Signs Vitals:   04/16/24 2123 04/16/24 2315 04/17/24 0514 04/17/24 0731  BP: (!) 140/101 127/83 127/83 124/83  Pulse:  76 73 75  Resp:  18 18 20   Temp:  98.8 F (37.1 C) 98.1 F (36.7 C) 97.9 F (36.6 C)  TempSrc:  Oral Oral Oral  SpO2: 100% 99% 98% 98%  Weight:      Height:        Intake/Output Summary (Last 24 hours) at 04/17/2024 0813 Last data filed at 04/17/2024 0500 Gross per 24 hour  Intake 300.62 ml  Output --  Net 300.62 ml      04/16/2024    2:38 PM 03/16/2024    7:28 AM 04/12/2023    8:59 AM  Last 3 Weights  Weight (lbs) 356 lb 7.7 oz 350 lb 356 lb 6.4 oz  Weight (kg) 161.7 kg 158.759 kg 161.662 kg     Telemetry/ECG  A-Fib, HR 90-110s - Personally Reviewed  Physical Exam  GEN: No acute distress.   Neck: No JVD Cardiac: iRRR, distant heart sounds.  Respiratory: Clear to auscultation bilaterally. GI: Soft, nontender, non-distended  MS: mild edema  Assessment & Plan   Paroxysmal A-Fib No previous know history of PAF Reported fatigue, shortness of breath x 3 days Started on IV diltiazem  Currently in A-Fib with HR 90-110s  Continue Eliquis 5 mg BID Continue IV diltiazem, currently at 15 mL/hr Scheduled for TEE/DCCV on Friday NPO at midnight  OSA not on CPAP Previously given CPAP  Not able to tolerate  Plan for patient to see Dr. Shlomo as outpatient to discuss other options  Hypertension  Previously noncompliant with hydrochlorothiazide  Started on IV diltiazem for A-Fib BP controlled this AM   For questions or updates, please contact [facility] Please consult [email or web address] for contact info under         Signed, Waddell DELENA Donath, PA-C

## 2024-04-17 NOTE — Progress Notes (Signed)
 [facility] PROGRESS NOTE   Yvonne Ali FMW:[id number] DOB: 25-Sep-1980 DOA: 04/16/2024  PCP: Georgina Speaks, FNP  Brief History: 43 y.o. female with medical history significant of hypertension, obesity, PE, protein S deficiency, status post sleeve gastrectomy presented with shortness of breath and weakness.  Found to have atrial fibrillation with RVR.  Patient was hospitalized for further management.  Consultants: Cardiology  Procedures: Echocardiogram is pending    Subjective/Interval History: Patient had some chest pain overnight which is better this morning.  Complains of headache this morning.  No nausea vomiting.    Assessment/Plan:  Atrial fibrillation with RVR, new onset Patient is noted to be on Cardizem infusion. Patient started on Eliquis. Cardiology consulted.  Plan is for DC cardioversion tomorrow. Echocardiogram is pending. TSH is pending.  Essential hypertension Not taking any medications.  She mentioned that she was prescribed something previously but has not been taking consistently.  Monitor blood pressures closely.  Microcytic anemia Hemoglobin is close to baseline. Check anemia panel.  History of PE/protein S deficiency Now back on anticoagulation as discussed above.  History of obesity/sleeve gastrectomy Estimated body mass index is 51.15 kg/m as calculated from the following:   Height as of this encounter: 5' 10 (1.778 m).   Weight as of this encounter: 161.7 kg.   DVT Prophylaxis: Eliquis Code Status: Full code Family Communication: Discussed with patient Disposition Plan: Hopefully home when better.  Status is: Observation The patient will require care spanning > 2 midnights and should be moved to inpatient because: Atrial fibrillation with RVR      Medications: Scheduled:  apixaban  5 mg Oral BID   sodium chloride  flush  3 mL Intravenous Q12H   Continuous:  diltiazem (CARDIZEM) infusion 15 mg/hr (04/17/24 0825)    PRN:acetaminophen , butalbital-acetaminophen -caffeine, LORazepam, polyethylene glycol   Objective:  Vital Signs  Vitals:   04/16/24 2123 04/16/24 2315 04/17/24 0514 04/17/24 0731  BP: (!) 140/101 127/83 127/83 124/83  Pulse:  76 73 75  Resp:  18 18 20   Temp:  98.8 F (37.1 C) 98.1 F (36.7 C) 97.9 F (36.6 C)  TempSrc:  Oral Oral Oral  SpO2: 100% 99% 98% 98%  Weight:      Height:        Intake/Output Summary (Last 24 hours) at 04/17/2024 0909 Last data filed at 04/17/2024 0500 Gross per 24 hour  Intake 300.62 ml  Output --  Net 300.62 ml   Filed Weights   04/16/24 1438  Weight: (!) 161.7 kg    General appearance: Awake alert.  In no distress Resp: Clear to auscultation bilaterally.  Normal effort Cardio: S1-S2 is irregularly irregular, tachycardic GI: Abdomen is soft.  Nontender nondistended.  Bowel sounds are present normal.  No masses organomegaly Extremities: No edema.  Full range of motion of lower extremities. Neurologic: Alert and oriented x3.  No focal neurological deficits.    Lab Results:  Data Reviewed: I have personally reviewed following labs and reports of the imaging studies  CBC: Recent Labs  Lab 04/16/24 0126 04/17/24 0030  WBC 6.8 5.1  HGB 10.6* 9.9*  HCT 36.2 33.4*  MCV 75.7* 75.4*  PLT 444* 383    Basic Metabolic Panel: Recent Labs  Lab 04/16/24 0126 04/17/24 0030  NA 138 137  K 3.6 3.6  CL 104 107  CO2 22 20*  GLUCOSE 113* 126*  BUN 10 11  CREATININE 1.02* 0.97  CALCIUM  9.3 8.5*  MG 1.9  --     GFR:  Estimated Creatinine Clearance: 127.5 mL/min (by C-G formula based on SCr of 0.97 mg/dL).  Liver Function Tests: Recent Labs  Lab 04/17/24 0030  AST 22  ALT 24  ALKPHOS 55  BILITOT 0.6  PROT 7.0  ALBUMIN 2.9*    Recent Results (from the past 240 hours)  Resp panel by RT-PCR (RSV, Flu A&B, Covid) Anterior Nasal Swab     Status: None   Collection Time: 04/16/24  1:26 AM   Specimen: Anterior Nasal Swab  Result  Value Ref Range Status   SARS Coronavirus 2 by RT PCR NEGATIVE NEGATIVE Final    Comment: (NOTE) SARS-CoV-2 target nucleic acids are NOT DETECTED.  The SARS-CoV-2 RNA is generally detectable in upper respiratory specimens during the acute phase of infection. The lowest concentration of SARS-CoV-2 viral copies this assay can detect is 138 copies/mL. A negative result does not preclude SARS-Cov-2 infection and should not be used as the sole basis for treatment or other patient management decisions. A negative result may occur with  improper specimen collection/handling, submission of specimen other than nasopharyngeal swab, presence of viral mutation(s) within the areas targeted by this assay, and inadequate number of viral copies(<138 copies/mL). A negative result must be combined with clinical observations, patient history, and epidemiological information. The expected result is Negative.  Fact Sheet for Patients:  bloggercourse.com  Fact Sheet for Healthcare Providers:  seriousbroker.it  This test is no t yet approved or cleared by the [place]  FDA and  has been authorized for detection and/or diagnosis of SARS-CoV-2 by FDA under an Emergency Use Authorization (EUA). This EUA will remain  in effect (meaning this test can be used) for the duration of the COVID-19 declaration under Section 564(b)(1) of the Act, 21 U.S.C.section 360bbb-3(b)(1), unless the authorization is terminated  or revoked sooner.       Influenza A by PCR NEGATIVE NEGATIVE Final   Influenza B by PCR NEGATIVE NEGATIVE Final    Comment: (NOTE) The Xpert Xpress SARS-CoV-2/FLU/RSV plus assay is intended as an aid in the diagnosis of influenza from Nasopharyngeal swab specimens and should not be used as a sole basis for treatment. Nasal washings and aspirates are unacceptable for Xpert Xpress SARS-CoV-2/FLU/RSV testing.  Fact Sheet for  Patients: bloggercourse.com  Fact Sheet for Healthcare Providers: seriousbroker.it  This test is not yet approved or cleared by the [place]  FDA and has been authorized for detection and/or diagnosis of SARS-CoV-2 by FDA under an Emergency Use Authorization (EUA). This EUA will remain in effect (meaning this test can be used) for the duration of the COVID-19 declaration under Section 564(b)(1) of the Act, 21 U.S.C. section 360bbb-3(b)(1), unless the authorization is terminated or revoked.     Resp Syncytial Virus by PCR NEGATIVE NEGATIVE Final    Comment: (NOTE) Fact Sheet for Patients: bloggercourse.com  Fact Sheet for Healthcare Providers: seriousbroker.it  This test is not yet approved or cleared by the [place]  FDA and has been authorized for detection and/or diagnosis of SARS-CoV-2 by FDA under an Emergency Use Authorization (EUA). This EUA will remain in effect (meaning this test can be used) for the duration of the COVID-19 declaration under Section 564(b)(1) of the Act, 21 U.S.C. section 360bbb-3(b)(1), unless the authorization is terminated or revoked.  Performed at [facility], [address] [place], [place] [address]   MRSA Next Gen by PCR, Nasal     Status: None   Collection Time: 04/16/24  2:49 PM   Specimen: Nasal Mucosa; Nasal Swab  Result Value Ref Range Status   MRSA by PCR Next Gen NOT DETECTED NOT DETECTED Final    Comment: (NOTE) The GeneXpert MRSA Assay (FDA approved for NASAL specimens only), is one component of a comprehensive MRSA colonization surveillance program. It is not intended to diagnose MRSA infection nor to guide or monitor treatment for MRSA infections. Test performance is not FDA approved in patients less than 57 years old. Performed at [facility], [address] [place], [place] [address]        Radiology Studies: DG Chest Portable 1 View Result Date: 04/16/2024 CLINICAL DATA:  Weakness and shortness of breath. EXAM: PORTABLE CHEST 1 VIEW COMPARISON:  July 22, 2013 FINDINGS: The heart size and mediastinal contours are within normal limits. Both lungs are clear. The visualized skeletal structures are unremarkable. IMPRESSION: No active disease. Electronically Signed   By: Suzen Dials M.D.   On: 04/16/2024 01:55       LOS: 0 days   Myishia Kasik Verdene  [facility] Pager on [email or web address]  04/17/2024, 9:09 AM

## 2024-04-18 ENCOUNTER — Inpatient Hospital Stay (HOSPITAL_COMMUNITY): Admitting: Anesthesiology

## 2024-04-18 ENCOUNTER — Inpatient Hospital Stay (HOSPITAL_COMMUNITY)

## 2024-04-18 ENCOUNTER — Other Ambulatory Visit (HOSPITAL_COMMUNITY): Payer: Self-pay

## 2024-04-18 ENCOUNTER — Encounter (HOSPITAL_COMMUNITY)
Admission: EM | Disposition: A | Discharge status: Home / Self Care | Payer: Self-pay | Source: Home / Self Care | Attending: Internal Medicine

## 2024-04-18 ENCOUNTER — Encounter (HOSPITAL_COMMUNITY): Payer: Self-pay | Admitting: Internal Medicine

## 2024-04-18 DIAGNOSIS — I480 Paroxysmal atrial fibrillation: Secondary | ICD-10-CM | POA: Diagnosis not present

## 2024-04-18 DIAGNOSIS — I340 Nonrheumatic mitral (valve) insufficiency: Secondary | ICD-10-CM

## 2024-04-18 DIAGNOSIS — I4891 Unspecified atrial fibrillation: Secondary | ICD-10-CM | POA: Diagnosis not present

## 2024-04-18 DIAGNOSIS — I10 Essential (primary) hypertension: Secondary | ICD-10-CM | POA: Diagnosis not present

## 2024-04-18 DIAGNOSIS — I4892 Unspecified atrial flutter: Secondary | ICD-10-CM | POA: Diagnosis not present

## 2024-04-18 HISTORY — PX: TRANSESOPHAGEAL ECHOCARDIOGRAM (CATH LAB): EP1270

## 2024-04-18 HISTORY — PX: CARDIOVERSION: EP1203

## 2024-04-18 LAB — ECHO TEE

## 2024-04-18 LAB — BASIC METABOLIC PANEL WITH GFR
Anion gap: 10 (ref 5–15)
BUN: 15 mg/dL (ref 6–20)
CO2: 20 mmol/L — ABNORMAL LOW (ref 22–32)
Calcium: 8.6 mg/dL — ABNORMAL LOW (ref 8.9–10.3)
Chloride: 107 mmol/L (ref 98–111)
Creatinine, Ser: 0.89 mg/dL (ref 0.44–1.00)
GFR, Estimated: >60 mL/min (ref >60)
Glucose, Bld: 130 mg/dL — ABNORMAL HIGH (ref 70–99)
Potassium: 3.7 mmol/L (ref 3.5–5.1)
Sodium: 137 mmol/L (ref 135–145)

## 2024-04-18 LAB — CBC
HCT: 33.7 % — ABNORMAL LOW (ref 36.0–46.0)
Hemoglobin: 9.9 g/dL — ABNORMAL LOW (ref 12.0–15.0)
MCH: 22.1 pg — ABNORMAL LOW (ref 26.0–34.0)
MCHC: 29.4 g/dL — ABNORMAL LOW (ref 30.0–36.0)
MCV: 75.4 fL — ABNORMAL LOW (ref 80.0–100.0)
Platelets: 410 K/uL — ABNORMAL HIGH (ref 150–400)
RBC: 4.47 MIL/uL (ref 3.87–5.11)
RDW: 17.7 % — ABNORMAL HIGH (ref 11.5–15.5)
WBC: 6.3 K/uL (ref 4.0–10.5)
nRBC: 0 % (ref 0.0–0.2)

## 2024-04-18 LAB — FERRITIN: Ferritin: 4 ng/mL — ABNORMAL LOW (ref 11–307)

## 2024-04-18 LAB — RETICULOCYTES
Immature Retic Fract: 25.1 % — ABNORMAL HIGH (ref 2.3–15.9)
RBC.: 4.48 MIL/uL (ref 3.87–5.11)
Retic Count, Absolute: 86.5 K/uL (ref 19.0–186.0)
Retic Ct Pct: 1.9 % (ref 0.4–3.1)

## 2024-04-18 LAB — MAGNESIUM: Magnesium: 2.2 mg/dL (ref 1.7–2.4)

## 2024-04-18 LAB — VITAMIN B12: Vitamin B-12: 330 pg/mL (ref 180–914)

## 2024-04-18 LAB — T4, FREE: Free T4: 1.23 ng/dL — ABNORMAL HIGH (ref 0.61–1.12)

## 2024-04-18 LAB — IRON AND TIBC
Iron: 17 ug/dL — ABNORMAL LOW (ref 28–170)
Saturation Ratios: 5 % — ABNORMAL LOW (ref 10.4–31.8)
TIBC: 377 ug/dL (ref 250–450)
UIBC: 360 ug/dL

## 2024-04-18 LAB — FOLATE: Folate: 10.4 ng/mL (ref >5.9)

## 2024-04-18 SURGERY — TRANSESOPHAGEAL ECHOCARDIOGRAM (TEE) (CATHLAB)
Anesthesia: General

## 2024-04-18 MED ORDER — FENTANYL CITRATE (PF) 100 MCG/2ML IJ SOLN
INTRAMUSCULAR | Status: AC
  Filled 2024-04-18: qty 2

## 2024-04-18 MED ORDER — APIXABAN 5 MG PO TABS
5.0000 mg | ORAL_TABLET | Freq: Two times a day (BID) | ORAL | 3 refills | Status: DC
  Filled 2024-04-18: qty 60, 30d supply, fill #0
  Filled 2024-05-12: qty 60, 30d supply, fill #1

## 2024-04-18 MED ORDER — FERROUS SULFATE 325 (65 FE) MG PO TBEC
325.0000 mg | DELAYED_RELEASE_TABLET | Freq: Two times a day (BID) | ORAL | 3 refills | Status: AC
  Filled 2024-04-18: qty 60, 30d supply, fill #0
  Filled 2024-05-12: qty 60, 30d supply, fill #1

## 2024-04-18 MED ORDER — POTASSIUM CHLORIDE CRYS ER 20 MEQ PO TBCR: 40.0000 meq | EXTENDED_RELEASE_TABLET | Freq: Once | ORAL | Status: DC

## 2024-04-18 MED ORDER — FENTANYL CITRATE (PF) 100 MCG/2ML IJ SOLN
INTRAMUSCULAR | Status: DC | PRN
  Administered 2024-04-18: 100 ug via INTRAVENOUS

## 2024-04-18 MED ORDER — DILTIAZEM HCL ER COATED BEADS 120 MG PO CP24
120.0000 mg | ORAL_CAPSULE | Freq: Every day | ORAL | Status: DC
  Administered 2024-04-18: 120 mg via ORAL
  Filled 2024-04-18: qty 1

## 2024-04-18 MED ORDER — DILTIAZEM HCL ER COATED BEADS 120 MG PO CP24
120.0000 mg | ORAL_CAPSULE | Freq: Every day | ORAL | 3 refills | Status: DC
  Filled 2024-04-18: qty 30, 30d supply, fill #0
  Filled 2024-05-12: qty 30, 30d supply, fill #1

## 2024-04-18 MED ORDER — PROPOFOL 10 MG/ML IV BOLUS
INTRAVENOUS | Status: DC | PRN
Start: 2024-04-18 — End: 2024-04-18
  Administered 2024-04-18 (×3): 20 mg via INTRAVENOUS
  Administered 2024-04-18: 120 ug/kg/min via INTRAVENOUS
  Administered 2024-04-18 (×2): 30 mg via INTRAVENOUS
  Administered 2024-04-18: 20 mg via INTRAVENOUS

## 2024-04-18 SURGICAL SUPPLY — 1 items: PAD DEFIB RADIO PHYSIO CONN (PAD) ×1 IMPLANT

## 2024-04-18 NOTE — Anesthesia Preprocedure Evaluation (Signed)
 Anesthesia Evaluation  Patient identified by MRN, date of birth, ID band Patient awake    Reviewed: Allergy & Precautions, NPO status , Patient's Chart, lab work & pertinent test results  Airway Mallampati: II  TM Distance: >3 FB Neck ROM: Full    Dental  (+) Teeth Intact, Dental Advisory Given   Pulmonary former smoker, PE   Pulmonary exam normal breath sounds clear to auscultation       Cardiovascular hypertension, + dysrhythmias Atrial Fibrillation  Rhythm:Irregular Rate:Abnormal     Neuro/Psych negative neurological ROS     GI/Hepatic negative GI ROS, Neg liver ROS,,,  Endo/Other    Class 4 obesity  Renal/GU negative Renal ROS     Musculoskeletal negative musculoskeletal ROS (+)    Abdominal   Peds  Hematology  (+) Blood dyscrasia (Eliquis), anemia   Anesthesia Other Findings Day of surgery medications reviewed with the patient.  Reproductive/Obstetrics                              Anesthesia Physical Anesthesia Plan  ASA: 4  Anesthesia Plan: General   Post-op Pain Management: Minimal or no pain anticipated   Induction: Intravenous  PONV Risk Score and Plan: 3 and TIVA  Airway Management Planned: Natural Airway and Simple Face Mask  Additional Equipment:   Intra-op Plan:   Post-operative Plan:   Informed Consent: I have reviewed the patients History and Physical, chart, labs and discussed the procedure including the risks, benefits and alternatives for the proposed anesthesia with the patient or authorized representative who has indicated his/her understanding and acceptance.     Dental advisory given  Plan Discussed with: CRNA  Anesthesia Plan Comments:          Anesthesia Quick Evaluation

## 2024-04-18 NOTE — Plan of Care (Signed)

## 2024-04-18 NOTE — CV Procedure (Signed)
 INDICATIONS: afib  PROCEDURE:   Informed consent was obtained prior to the procedure. The risks, benefits and alternatives for the procedure were discussed and the patient comprehended these risks.  Risks include, but are not limited to, cough, sore throat, vomiting, nausea, somnolence, esophageal and stomach trauma or perforation, bleeding, low blood pressure, aspiration, pneumonia, infection, trauma to the teeth and death.    Procedural time out performed.  During this procedure the patient was administered propofol per anesthesia.  The patient's heart rate, blood pressure, and oxygen saturation were monitored continuously during the procedure. The period of conscious sedation was 20 minutes, of which I was present face-to-face 100% of this time.  The transesophageal probe was inserted in the esophagus and stomach without difficulty and multiple views were obtained.  The patient was kept under observation until the patient left the procedure room.  The patient left the procedure room in stable condition.   Agitated microbubble saline contrast was administered.  COMPLICATIONS:    There were no immediate complications.  FINDINGS:   FORMAL ECHOCARDIOGRAM REPORT PENDING Normal EF Mild-mod MR Mild TR Moderately dilated LA Normal aortic valve and aorta.  Normal LA appendage. No thrombus  RECOMMENDATIONS:     Proceed to cardioversion.   Procedure: Electrical Cardioversion Indications:  Atrial Fibrillation  Procedure Details:  Consent: Risks of procedure as well as the alternatives and risks of each were explained to the (patient/caregiver).  Consent for procedure obtained.  Time Out: Verified patient identification, verified procedure, site/side was marked, verified correct patient position, special equipment/implants available, medications/allergies/relevent history reviewed, required imaging and test results available. PERFORMED.  Patient placed on cardiac monitor, pulse  oximetry, supplemental oxygen as necessary.  Sedation given: propofol per anesthesia Pacer pads placed anterior and posterior chest.  Cardioverted 1 time(s).  Cardioversion with synchronized biphasic 300J shock.  Evaluation: Findings: Post procedure EKG shows: NSR Complications: None Patient did tolerate procedure well.  Time Spent Directly with the Patient:  30 minutes   Dyquan Minks A Sevin Langenbach 04/18/2024, 9:32 AM

## 2024-04-18 NOTE — TOC Transition Note (Signed)
 Transition of Care [facility]) - Discharge Note   Patient Details  Name: Yvonne Ali MRN: [id number] Date of Birth: 04-27-1981  [facility] CM/SW Contact:  Roxie KANDICE Stain, RN Phone Number: 04/18/2024, 12:22 PM   Clinical Narrative:    [name] is stable to discharge home. Follow up apt on [facility]. No ICM (Inpatient Care Management) needs at this time.    Final next level of care: Home/Self Care Barriers to Discharge: Barriers Resolved   Patient Goals and CMS Choice Patient states their goals for this hospitalization and ongoing recovery are:: return home          Discharge Placement               home        Discharge Plan and Services Additional resources added to the After Visit Summary for                                       Social Drivers of Health (SDOH) Interventions SDOH Screenings   Food Insecurity: No Food Insecurity (04/16/2024)  Housing: Low Risk  (04/16/2024)  Transportation Needs: No Transportation Needs (04/16/2024)  Utilities: Not At Risk (04/16/2024)  Alcohol Screen: Low Risk  (04/11/2023)  Depression (PHQ2-9): Low Risk  (04/12/2023)  Financial Resource Strain: Low Risk  (04/11/2023)  Physical Activity: Unknown (04/11/2023)  Social Connections: Unknown (04/11/2023)  Stress: No Stress Concern Present (04/11/2023)  Tobacco Use: Medium Risk (04/18/2024)     Readmission Risk Interventions    04/18/2024   12:22 PM  Readmission Risk Prevention Plan  Post Dischage Appt Complete  Medication Screening Complete  Transportation Screening Complete

## 2024-04-18 NOTE — Interval H&P Note (Signed)
 History and Physical Interval Note:  04/18/2024 7:49 AM  Yvonne Ali  has presented today for surgery, with the diagnosis of afib.  The various methods of treatment have been discussed with the patient and family. After consideration of risks, benefits and other options for treatment, the patient has consented to  Procedure(s): TRANSESOPHAGEAL ECHOCARDIOGRAM (N/A) CARDIOVERSION (N/A) as a surgical intervention.  The patient's history has been reviewed, patient examined, no change in status, stable for surgery.  I have reviewed the patient's chart and labs.  Questions were answered to the patient's satisfaction.     Mohamedamin Nifong A Phelicia Dantes

## 2024-04-18 NOTE — Progress Notes (Signed)
  Echocardiogram Echocardiogram Transesophageal has been performed.  Thea Norlander 04/18/2024, 9:53 AM

## 2024-04-18 NOTE — Discharge Summary (Signed)
 [facility]  Physician Discharge Summary   Patient ID: Yvonne Ali MRN: [id number] DOB/AGE: 12-28-1980 43 y.o.  Admit date: 04/16/2024 Discharge date: 04/18/2024    PCP: Georgina Speaks, FNP  DISCHARGE DIAGNOSES:    Atrial flutter with rapid ventricular response (HCC)   History of pulmonary embolus (PE)   Essential hypertension   Protein S deficiency   Class 3 severe obesity due to excess calories without serious comorbidity with body mass index (BMI) of 50.0 to 59.9 in adult [facility])   RECOMMENDATIONS FOR OUTPATIENT FOLLOW UP: Cardiology to schedule outpatient follow-up Ambulatory referral sent for iron  infusion PCP to initiate further workup for iron  deficiency anemia Thyroid function test to be repeated in 4 to 6 weeks.    Home Health: None Equipment/Devices: None  CODE STATUS: Full code  DISCHARGE CONDITION: fair  Diet recommendation: Heart healthy  INITIAL HISTORY: 43 y.o. female with medical history significant of hypertension, obesity, PE, protein S deficiency, status post sleeve gastrectomy presented with shortness of breath and weakness.  Found to have atrial fibrillation with RVR.  Patient was hospitalized for further management.   Consultants: Cardiology   Procedures: Echocardiogram.  DC cardioversion  HOSPITAL COURSE:   Atrial fibrillation with RVR, new onset Patient was started on Cardizem infusion and Eliquis.  Cardiology was consulted.  Echocardiogram was done which showed normal LVEF.  Patient underwent DC cardioversion.  Sinus rhythm was noted.  Cleared by cardiology for discharge.   TSH was noted to be 0.34.  Free T4 noted to be 1.23.  Free T3 is 3.5.  Will recommend that thyroid function test be repeated in 4 to 6 weeks.     Essential hypertension Blood pressures were stable.   Microcytic anemia Anemia panel shows ferritin of 4, iron  of 17, TIBC 377, percent saturation 5.  B12 is 330.  Folic acid 10.4.  Consistent with iron  deficiency.   Patient denies menorrhagia.  She mentioned that she has had a longstanding history of iron  deficiency.  Denies any blood in the stools or black-colored stools.  No history of colon cancer in the family.  Ambulatory referral sent to infusion clinic for iron  infusions.  She has been told to discuss this further with her PCP for further evaluation in the outpatient setting.   History of PE/protein S deficiency Now back on anticoagulation as discussed above.  Abnormal thyroid function test Recommend  that these be repeated in the outpatient setting in the next few weeks.   History of obesity/sleeve gastrectomy Estimated body mass index is 51.15 kg/m as calculated from the following:   Height as of this encounter: 5' 10 (1.778 m).   Weight as of this encounter: 161.7 kg.   Patient is stable.  Okay for discharge home.  PERTINENT LABS:  The results of significant diagnostics from this hospitalization (including imaging, microbiology, ancillary and laboratory) are listed below for reference.    Microbiology: Recent Results (from the past 240 hours)  Resp panel by RT-PCR (RSV, Flu A&B, Covid) Anterior Nasal Swab     Status: None   Collection Time: 04/16/24  1:26 AM   Specimen: Anterior Nasal Swab  Result Value Ref Range Status   SARS Coronavirus 2 by RT PCR NEGATIVE NEGATIVE Final    Comment: (NOTE) SARS-CoV-2 target nucleic acids are NOT DETECTED.  The SARS-CoV-2 RNA is generally detectable in upper respiratory specimens during the acute phase of infection. The lowest concentration of SARS-CoV-2 viral copies this assay can detect is 138 copies/mL. A negative result  does not preclude SARS-Cov-2 infection and should not be used as the sole basis for treatment or other patient management decisions. A negative result may occur with  improper specimen collection/handling, submission of specimen other than nasopharyngeal swab, presence of viral mutation(s) within the areas targeted by this  assay, and inadequate number of viral copies(<138 copies/mL). A negative result must be combined with clinical observations, patient history, and epidemiological information. The expected result is Negative.  Fact Sheet for Patients:  bloggercourse.com  Fact Sheet for Healthcare Providers:  seriousbroker.it  This test is no t yet approved or cleared by the [place]  FDA and  has been authorized for detection and/or diagnosis of SARS-CoV-2 by FDA under an Emergency Use Authorization (EUA). This EUA will remain  in effect (meaning this test can be used) for the duration of the COVID-19 declaration under Section 564(b)(1) of the Act, 21 U.S.C.section 360bbb-3(b)(1), unless the authorization is terminated  or revoked sooner.       Influenza A by PCR NEGATIVE NEGATIVE Final   Influenza B by PCR NEGATIVE NEGATIVE Final    Comment: (NOTE) The Xpert Xpress SARS-CoV-2/FLU/RSV plus assay is intended as an aid in the diagnosis of influenza from Nasopharyngeal swab specimens and should not be used as a sole basis for treatment. Nasal washings and aspirates are unacceptable for Xpert Xpress SARS-CoV-2/FLU/RSV testing.  Fact Sheet for Patients: bloggercourse.com  Fact Sheet for Healthcare Providers: seriousbroker.it  This test is not yet approved or cleared by the [place]  FDA and has been authorized for detection and/or diagnosis of SARS-CoV-2 by FDA under an Emergency Use Authorization (EUA). This EUA will remain in effect (meaning this test can be used) for the duration of the COVID-19 declaration under Section 564(b)(1) of the Act, 21 U.S.C. section 360bbb-3(b)(1), unless the authorization is terminated or revoked.     Resp Syncytial Virus by PCR NEGATIVE NEGATIVE Final    Comment: (NOTE) Fact Sheet for Patients: bloggercourse.com  Fact Sheet for  Healthcare Providers: seriousbroker.it  This test is not yet approved or cleared by the [place]  FDA and has been authorized for detection and/or diagnosis of SARS-CoV-2 by FDA under an Emergency Use Authorization (EUA). This EUA will remain in effect (meaning this test can be used) for the duration of the COVID-19 declaration under Section 564(b)(1) of the Act, 21 U.S.C. section 360bbb-3(b)(1), unless the authorization is terminated or revoked.  Performed at [facility], [address] [place], [place] [address]   MRSA Next Gen by PCR, Nasal     Status: None   Collection Time: 04/16/24  2:49 PM   Specimen: Nasal Mucosa; Nasal Swab  Result Value Ref Range Status   MRSA by PCR Next Gen NOT DETECTED NOT DETECTED Final    Comment: (NOTE) The GeneXpert MRSA Assay (FDA approved for NASAL specimens only), is one component of a comprehensive MRSA colonization surveillance program. It is not intended to diagnose MRSA infection nor to guide or monitor treatment for MRSA infections. Test performance is not FDA approved in patients less than 58 years old. Performed at [facility], [address] [place], [place] [address]      Labs:   Basic Metabolic Panel: Recent Labs  Lab 04/16/24 0126 04/17/24 0030 04/18/24 0222  NA 138 137 137  K 3.6 3.6 3.7  CL 104 107 107  CO2 22 20* 20*  GLUCOSE 113* 126* 130*  BUN 10 11 15   CREATININE 1.02* 0.97 0.89  CALCIUM  9.3 8.5* 8.6*  MG 1.9  --  2.2   Liver Function Tests: Recent Labs  Lab 04/17/24 0030  AST 22  ALT 24  ALKPHOS 55  BILITOT 0.6  PROT 7.0  ALBUMIN 2.9*    CBC: Recent Labs  Lab 04/16/24 0126 04/17/24 0030 04/18/24 0222  WBC 6.8 5.1 6.3  HGB 10.6* 9.9* 9.9*  HCT 36.2 33.4* 33.7*  MCV 75.7* 75.4* 75.4*  PLT 444* 383 410*     IMAGING STUDIES ECHO TEE Result Date: 04/18/2024    TRANSESOPHOGEAL ECHO REPORT   Patient Name:   Yvonne Ali Date of  Exam: 04/18/2024 Medical Rec #:  [id number]        Height:       70.0 in Accession #:    [id number]       Weight:       355.0 lb Date of Birth:  07/10/1980         BSA:          2.664 m Patient Age:    43 years         BP:           136/96 mmHg Patient Gender: F                HR:           119 bpm. Exam Location:  Inpatient Procedure: Transesophageal Echo, Cardiac Doppler, Color Doppler and 3D Echo            (Both Spectral and Color Flow Doppler were utilized during            procedure). Indications:     Atrial Fibrillation I48.91  History:         Patient has prior history of Echocardiogram examinations, most                  recent 04/17/2024. Arrythmias:Atrial Flutter; Risk                  Factors:Hypertension.  Sonographer:     Thea Norlander RCS Referring Phys:  [id number] SOYLA DELENA MERCK Diagnosing Phys: Soyla Merck MD PROCEDURE: After discussion of the risks and benefits of a TEE, an informed consent was obtained from the patient. The transesophogeal probe was passed without difficulty through the esophogus of the patient. Imaged were obtained with the patient in a left lateral decubitus position. Sedation performed by different physician. The patient was monitored while under deep sedation. Anesthestetic sedation was provided intravenously by Anesthesiology: 903.14mg  of Propofol, 0mg  of Lidocaine . Image quality was good. The patient's vital signs; including heart rate, blood pressure, and oxygen saturation; remained stable throughout the procedure. The patient developed no complications during the procedure. A successful direct current cardioversion was performed at 300 joules with 1 attempt.  IMPRESSIONS  1. Left ventricular ejection fraction, by estimation, is 60 to 65%. The left ventricle has normal function.  2. Right ventricular systolic function is mildly reduced. The right ventricular size is normal.  3. Left atrial size was moderately dilated. No left atrial/left atrial appendage thrombus was  detected.  4. The mitral valve is normal in structure. Mild to moderate mitral valve regurgitation.  5. The aortic valve is tricuspid. Aortic valve regurgitation is trivial.  6. Agitated saline contrast bubble study was negative, with no evidence of any interatrial shunt.  7. 3D performed of the LAA and demonstrates normal LAA with no thrombus. Conclusion(s)/Recommendation(s): No LA/LAA thrombus identified. Successful cardioversion performed with restoration of normal sinus rhythm. FINDINGS  Left Ventricle: Left ventricular ejection fraction, by estimation, is 60 to 65%. The left ventricle has normal function. The left ventricular internal cavity size was normal in size. Right Ventricle: The right ventricular size is normal. No increase in right ventricular wall thickness. Right ventricular systolic function is mildly reduced. Left Atrium: Left atrial size was moderately dilated. No left atrial/left atrial appendage thrombus was detected. Right Atrium: Right atrial size was normal in size. Pericardium: There is no evidence of pericardial effusion. Mitral Valve: The mitral valve is normal in structure. Mild to moderate mitral valve regurgitation. Tricuspid Valve: The tricuspid valve is normal in structure. Tricuspid valve regurgitation is mild. Aortic Valve: The aortic valve is tricuspid. Aortic valve regurgitation is trivial. Pulmonic Valve: The pulmonic valve was normal in structure. Pulmonic valve regurgitation is trivial. Aorta: The aortic root and ascending aorta are structurally normal, with no evidence of dilitation. IAS/Shunts: No atrial level shunt detected by color flow Doppler. Agitated saline contrast was given intravenously to evaluate for intracardiac shunting. Agitated saline contrast bubble study was negative, with no evidence of any interatrial shunt. Additional Comments: 3D was performed not requiring image post processing on an independent workstation and was normal. Spectral Doppler performed.  LEFT VENTRICLE PLAX 2D LVOT diam:     2.10 cm LVOT Area:     3.46 cm   AORTA Ao Root diam: 3.30 cm Ao Asc diam:  3.10 cm  SHUNTS Systemic Diam: 2.10 cm Soyla Merck MD Electronically signed by Soyla Merck MD Signature Date/Time: 04/18/2024/3:38:17 PM    Final    EP STUDY Result Date: 04/18/2024 See surgical note for result.  ECHOCARDIOGRAM COMPLETE Result Date: 04/17/2024    ECHOCARDIOGRAM REPORT   Patient Name:   Yvonne Ali Date of Exam: 04/17/2024 Medical Rec #:  [id number]        Height:       70.0 in Accession #:    [id number]       Weight:       356.5 lb Date of Birth:  1980/10/27         BSA:          2.669 m Patient Age:    43 years         BP:           123/88 mmHg Patient Gender: F                HR:           99 bpm. Exam Location:  Inpatient Procedure: 2D Echo (Both Spectral and Color Flow Doppler were utilized during            procedure). Indications:    atrial fibrillation  History:        Patient has no prior history of Echocardiogram examinations.                 Risk Factors:Former Smoker and Hypertension.  Sonographer:    Tinnie Barefoot RDCS Referring Phys: [id number] MARSA NOVAK MELVIN  Sonographer Comments: Patient is obese. Image acquisition challenging due to patient body habitus. IMPRESSIONS  1. Left ventricular ejection fraction, by estimation, is 60 to 65%. The left ventricle has normal function. The left ventricle has no regional wall motion abnormalities. There is moderate concentric left ventricular hypertrophy. Left ventricular diastolic function could not be evaluated.  2. Right ventricular systolic function is normal. The right ventricular size is normal.  3. Left atrial size was moderately dilated.  4. The mitral valve is  normal in structure. Mild mitral valve regurgitation. No evidence of mitral stenosis.  5. The aortic valve is tricuspid. There is mild calcification of the aortic valve. Aortic valve regurgitation is not visualized. No aortic stenosis is present.  6.  The inferior vena cava is normal in size with greater than 50% respiratory variability, suggesting right atrial pressure of 3 mmHg. FINDINGS  Left Ventricle: Left ventricular ejection fraction, by estimation, is 60 to 65%. The left ventricle has normal function. The left ventricle has no regional wall motion abnormalities. The left ventricular internal cavity size was normal in size. There is  moderate concentric left ventricular hypertrophy. Left ventricular diastolic function could not be evaluated due to atrial fibrillation. Left ventricular diastolic function could not be evaluated. Right Ventricle: The right ventricular size is normal. No increase in right ventricular wall thickness. Right ventricular systolic function is normal. Left Atrium: Left atrial size was moderately dilated. Right Atrium: Right atrial size was normal in size. Pericardium: There is no evidence of pericardial effusion. Mitral Valve: The mitral valve is normal in structure. Mild mitral valve regurgitation, with eccentric posteriorly directed jet. No evidence of mitral valve stenosis. Tricuspid Valve: The tricuspid valve is normal in structure. Tricuspid valve regurgitation is mild . No evidence of tricuspid stenosis. Aortic Valve: The aortic valve is tricuspid. There is mild calcification of the aortic valve. Aortic valve regurgitation is not visualized. No aortic stenosis is present. Pulmonic Valve: The pulmonic valve was normal in structure. Pulmonic valve regurgitation is mild. No evidence of pulmonic stenosis. Aorta: The aortic root is normal in size and structure. Venous: The inferior vena cava is normal in size with greater than 50% respiratory variability, suggesting right atrial pressure of 3 mmHg. IAS/Shunts: No atrial level shunt detected by color flow Doppler.  LEFT VENTRICLE PLAX 2D LVIDd:         5.40 cm LVIDs:         3.10 cm LV PW:         1.30 cm LV IVS:        1.40 cm LVOT diam:     2.00 cm LV SV:         43 LV SV Index:    16 LVOT Area:     3.14 cm  RIGHT VENTRICLE             IVC RV Basal diam:  2.90 cm     IVC diam: 1.90 cm RV S prime:     15.10 cm/s TAPSE (M-mode): 3.2 cm LEFT ATRIUM              Index        RIGHT ATRIUM           Index LA diam:        5.30 cm  1.99 cm/m   RA Area:     15.70 cm LA Vol (A2C):   151.0 ml 56.59 ml/m  RA Volume:   38.80 ml  14.54 ml/m LA Vol (A4C):   116.0 ml 43.47 ml/m LA Biplane Vol: 133.0 ml 49.84 ml/m  AORTIC VALVE LVOT Vmax:   77.90 cm/s LVOT Vmean:  54.400 cm/s LVOT VTI:    0.137 m  AORTA Ao Root diam: 3.10 cm Ao Asc diam:  3.20 cm  SHUNTS Systemic VTI:  0.14 m Systemic Diam: 2.00 cm Toribio Fuel MD Electronically signed by Toribio Fuel MD Signature Date/Time: 04/17/2024/11:58:35 PM    Final    DG Chest Portable 1 View Result Date:  04/16/2024 CLINICAL DATA:  Weakness and shortness of breath. EXAM: PORTABLE CHEST 1 VIEW COMPARISON:  July 22, 2013 FINDINGS: The heart size and mediastinal contours are within normal limits. Both lungs are clear. The visualized skeletal structures are unremarkable. IMPRESSION: No active disease. Electronically Signed   By: Suzen Dials M.D.   On: 04/16/2024 01:55    DISCHARGE EXAMINATION: Vitals:   04/18/24 0955 04/18/24 1026 04/18/24 1140 04/18/24 1159  BP: (!) 145/98 113/84 134/83 134/83  Pulse: 87 93    Resp: 19 16 18    Temp:  (!) 97.5 F (36.4 C) 97.8 F (36.6 C)   TempSrc:  Oral Oral   SpO2: 99% 100% 100%   Weight:      Height:       General appearance: Awake alert.  In no distress Resp: Clear to auscultation bilaterally.  Normal effort Cardio: S1-S2 is normal regular.  No S3-S4.  No rubs murmurs or bruit GI: Abdomen is soft.  Nontender nondistended.  Bowel sounds are present normal.  No masses organomegaly Extremities: No edema.  Full range of motion of lower extremities. Neurologic: Alert and oriented x3.  No focal neurological deficits.    DISPOSITION: Home  Discharge Instructions     Amb Referral to  Intravenous Iron  Therapy   Complete by: As directed    You have been referred to [facility] Infusion team for IV Iron  Infusions. The infusion pharmacy team will reach out to you with appointment information.    Primary Diagnosis Code for IV Iron : D50.9 - Iron  deficiency Anemia   Secondary diagnosis code for IV iron : Other   Comment: atrial fibrillation   Amb referral to AFIB Clinic   Complete by: As directed    Call MD for:  difficulty breathing, headache or visual disturbances   Complete by: As directed    Call MD for:  extreme fatigue   Complete by: As directed    Call MD for:  persistant dizziness or light-headedness   Complete by: As directed    Call MD for:  persistant nausea and vomiting   Complete by: As directed    Call MD for:  severe uncontrolled pain   Complete by: As directed    Call MD for:  temperature >100.4   Complete by: As directed    Diet - low sodium heart healthy   Complete by: As directed    Discharge instructions   Complete by: As directed    Please take your medications as prescribed.  Please be sure to follow-up with your PCP for further management of your iron  deficiency and abnormal thyroid function test.  Ambulatory referral has been sent for iron  infusion.  Seek attention if your symptoms recur.  You were cared for by a hospitalist during your hospital stay. If you have any questions about your discharge medications or the care you received while you were in the hospital after you are discharged, you can call the unit and asked to speak with the hospitalist on call if the hospitalist that took care of you is not available. Once you are discharged, your primary care physician will handle any further medical issues. Please note that NO REFILLS for any discharge medications will be authorized once you are discharged, as it is imperative that you return to your primary care physician (or establish a relationship with a primary care physician if you do not have one)  for your aftercare needs so that they can reassess your need for medications and monitor your  lab values. If you do not have a primary care physician, you can call [phone] for a physician referral.   Increase activity slowly   Complete by: As directed          Allergies as of 04/18/2024   No Known Allergies      Medication List     STOP taking these medications    hydrochlorothiazide  12.5 MG tablet Commonly known as: HYDRODIURIL    ibuprofen  200 MG tablet Commonly known as: ADVIL        TAKE these medications    acetaminophen  500 MG tablet Commonly known as: TYLENOL  Take 500 mg by mouth every 6 (six) hours as needed for headache or fever (pain).   diltiazem 120 MG 24 hr capsule Commonly known as: CARDIZEM CD Take 1 capsule (120 mg total) by mouth daily.   Eliquis 5 MG Tabs tablet Generic drug: apixaban Take 1 tablet (5 mg total) by mouth 2 (two) times daily.   ferrous sulfate  325 (65 FE) MG EC tablet Take 1 tablet (325 mg total) by mouth 2 (two) times daily.   meclizine 25 MG tablet Commonly known as: ANTIVERT Take 1 tablet (25 mg total) by mouth 3 (three) times daily as needed for dizziness.   Multivitamin Women Tabs Take 1 tablet by mouth daily.   triamcinolone cream 0.1 % Commonly known as: KENALOG Apply 1 Application topically 2 (two) times daily. What changed:  when to take this reasons to take this          Follow-up Information     Georgina Speaks, FNP Follow up in 2 day(s).   Specialty: General Practice Why: Hospital follow up Contact information: [address] [place] [address] [phone]                 TOTAL DISCHARGE TIME: 35 minutes  Braiden Rodman Verdene  [facility] Pager on [email or web address]  04/19/2024, 10:39 AM

## 2024-04-18 NOTE — Progress Notes (Signed)
  Progress Note  Patient Name: SHEKELIA BOUTIN Date of Encounter: 04/18/2024 [facility] Cardiologist: None   Interval Summary   Seen in endo Needed new iv   Vital Signs Vitals:   04/17/24 2321 04/18/24 0405 04/18/24 0533 04/18/24 0713  BP: 129/84 116/72 139/81 124/85  Pulse: 100 90  (!) 105  Resp: 20 20  13   Temp: 97.9 F (36.6 C) 98.4 F (36.9 C)  97.8 F (36.6 C)  TempSrc: Oral Oral  Tympanic  SpO2: 98% 99% 98% 98%  Weight:    (!) 161 kg  Height:    5' 10 (1.778 m)    Intake/Output Summary (Last 24 hours) at 04/18/2024 0913 Last data filed at 04/17/2024 2048 Gross per 24 hour  Intake 380.87 ml  Output --  Net 380.87 ml      04/18/2024    7:13 AM 04/16/2024    2:38 PM 03/16/2024    7:28 AM  Last 3 Weights  Weight (lbs) 355 lb 356 lb 7.7 oz 350 lb  Weight (kg) 161.027 kg 161.7 kg 158.759 kg     Telemetry/ECG  A-Fib, HR 90-110s - Personally Reviewed  Physical Exam  GEN: No acute distress.   Neck: No JVD Cardiac: iRRR, distant heart sounds.  Respiratory: Clear to auscultation bilaterally. GI: Soft, nontender, non-distended  MS: mild edema  Assessment & Plan   Paroxysmal A-Fib No previous know history of PAF Reported fatigue, shortness of breath x 3 days Started on IV diltiazem  Currently in A-Fib with HR 90-110s  Continue Eliquis 5 mg BID has had 4 doses  Continue IV diltiazem, currently at 15 mL/hr TEE [facility] this am Convert to oral cardizem post procedure   OSA not on CPAP Previously given CPAP  Not able to tolerate  Plan for patient to see Dr. Shlomo as outpatient to discuss other options  Hypertension  Previously noncompliant with hydrochlorothiazide  Started on IV diltiazem for A-Fib BP controlled this AM Will d/c on oral cardizem 120 mg daily  Patient can likely be d/c home on cardizem 120 mg daily and eliquis if TEE/DCC Successful If not would have EP evaluate patient    For questions or updates, please contact   [facility] Please consult [email or web address] for contact info under         Signed, Maude Emmer, MD

## 2024-04-18 NOTE — Transfer of Care (Signed)
 Immediate Anesthesia Transfer of Care Note  Patient: Yvonne Ali  Procedure(s) Performed: TRANSESOPHAGEAL ECHOCARDIOGRAM CARDIOVERSION  Patient Location: PACU and Cath Lab  Anesthesia Type:General  Level of Consciousness: awake  Airway & Oxygen Therapy: Patient connected to nasal cannula oxygen  Post-op Assessment: Report given to RN  Post vital signs: stable  Last Vitals:  Vitals Value Taken Time  BP    Temp    Pulse    Resp    SpO2      Last Pain:  Vitals:   04/18/24 0713  TempSrc: Tympanic  PainSc: 0-No pain         Complications: No notable events documented.

## 2024-04-19 LAB — T3, FREE: T3, Free: 3.5 pg/mL (ref 2.0–4.4)

## 2024-04-20 ENCOUNTER — Encounter (HOSPITAL_COMMUNITY): Payer: Self-pay | Admitting: Internal Medicine

## 2024-04-21 ENCOUNTER — Other Ambulatory Visit (HOSPITAL_COMMUNITY): Payer: Self-pay | Admitting: Internal Medicine

## 2024-04-21 ENCOUNTER — Telehealth (HOSPITAL_COMMUNITY): Payer: Self-pay

## 2024-04-21 ENCOUNTER — Telehealth: Payer: Self-pay

## 2024-04-21 DIAGNOSIS — D509 Iron deficiency anemia, unspecified: Secondary | ICD-10-CM | POA: Insufficient documentation

## 2024-04-21 NOTE — Telephone Encounter (Signed)
 Auth Submission: NO AUTH NEEDED Site of care: Site of care: [facility] Payer: [employer] [place]  Medication & CPT/J Code(s) submitted: Feraheme  (ferumoxytol ) [id number] Diagnosis Code: D50.9 Route of submission (phone, fax, portal): phone Phone # Fax # Auth type: Buy/Bill HB Units/visits requested: 510mg  x 2 doses Reference number: [id number] PM Approval from: 04/21/24 to 06/11/24

## 2024-04-21 NOTE — Anesthesia Postprocedure Evaluation (Signed)
 Anesthesia Post Note  Patient: Yvonne Ali  Procedure(s) Performed: TRANSESOPHAGEAL ECHOCARDIOGRAM CARDIOVERSION     Patient location during evaluation: Cath Lab Anesthesia Type: General Level of consciousness: awake and alert Pain management: pain level controlled Vital Signs Assessment: post-procedure vital signs reviewed and stable Respiratory status: spontaneous breathing, nonlabored ventilation and respiratory function stable Cardiovascular status: blood pressure returned to baseline and stable Postop Assessment: no apparent nausea or vomiting Anesthetic complications: no   No notable events documented.  Last Vitals:  Vitals:   04/18/24 1140 04/18/24 1159  BP: 134/83 134/83  Pulse:    Resp: 18   Temp: 36.6 C   SpO2: 100%     Last Pain:  Vitals:   04/18/24 1140  TempSrc: Oral  PainSc:                  Garnette FORBES Skillern

## 2024-04-21 NOTE — Transitions of Care (Post Inpatient/ED Visit) (Signed)
   04/21/2024  Name: Yvonne Ali MRN: [id number] DOB: 12-24-1980  Today's TOC FU Call Status: Today's TOC FU Call Status:: Unsuccessful Call (1st Attempt) Unsuccessful Call (1st Attempt) Date: 04/21/24  Attempted to reach the patient regarding the most recent Inpatient/ED visit.  Follow Up Plan: Additional outreach attempts will be made to reach the patient to complete the Transitions of Care (Post Inpatient/ED visit) call.   Signature  Charmaine Bloodgood, LPN [facility] Health Advisor [place] [facility] You Are. We Are. One [facility] Direct Dial [phone]

## 2024-04-22 ENCOUNTER — Telehealth (HOSPITAL_COMMUNITY): Payer: Self-pay | Admitting: Internal Medicine

## 2024-04-23 ENCOUNTER — Telehealth: Payer: Self-pay

## 2024-04-23 NOTE — Transitions of Care (Post Inpatient/ED Visit) (Signed)
   04/23/2024  Name: Yvonne Ali MRN: [id number] DOB: 29-Aug-1980  Today's TOC FU Call Status: Today's TOC FU Call Status:: Successful TOC FU Call Completed TOC FU Call Complete Date: 04/23/24  Patient's Name and Date of Birth confirmed. DOB, Name  Transition Care Management Follow-up Telephone Call Date of Discharge: 04/18/24 Discharge Facility: [facility]) Type of Discharge: Inpatient Admission Primary Inpatient Discharge Diagnosis:: Atrial flutter with rapid ventricular response How have you been since you were released from the hospital?: Better Any questions or concerns?: No  Items Reviewed: Did you receive and understand the discharge instructions provided?: Yes Medications obtained,verified, and reconciled?: Yes (Medications Reviewed) Any new allergies since your discharge?: No Dietary orders reviewed?: No Do you have support at home?: Yes People in Home [RPT]: spouse  Medications Reviewed Today: Medications Reviewed Today     Reviewed by Gladis Kristeen PARAS, CMA (Certified Medical Assistant) on 04/23/24 at 1205  Med List Status: <None>   Medication Order Taking? Sig Documenting Provider Last Dose Status Informant  acetaminophen  (TYLENOL ) 500 MG tablet [id number] Yes Take 500 mg by mouth every 6 (six) hours as needed for headache or fever (pain). [provider]  Active Self, Pharmacy Records  apixaban (ELIQUIS) 5 MG TABS tablet [id number] Yes Take 1 tablet (5 mg total) by mouth 2 (two) times daily. Krishnan, Gokul, MD  Active   diltiazem [facility] CD) 120 MG 24 hr capsule [id number] Yes Take 1 capsule (120 mg total) by mouth daily. Krishnan, Gokul, MD  Active   ferrous sulfate  325 (65 FE) MG EC tablet [id number] Yes Take 1 tablet (325 mg total) by mouth 2 (two) times daily. Krishnan, Gokul, MD  Active   meclizine (ANTIVERT) 25 MG tablet [id number] Yes Take 1 tablet (25 mg total) by mouth 3 (three) times daily as needed for dizziness. Roselyn Carlin NOVAK, MD  Active  Self, Pharmacy Records  Multiple Vitamins-Minerals (MULTIVITAMIN WOMEN) TABS [id number] Yes Take 1 tablet by mouth daily. [provider]  Active Self, Pharmacy Records  triamcinolone cream (KENALOG) 0.1 % [id number] Yes Apply 1 Application topically 2 (two) times daily.  Patient taking differently: Apply 1 Application topically 2 (two) times daily as needed (skin irritation).   Roselyn Carlin NOVAK, MD  Active Self, Pharmacy Records            Home Care and Equipment/Supplies: Were Home Health Services Ordered?: No Any new equipment or medical supplies ordered?: No  Functional Questionnaire: Do you need assistance with bathing/showering or dressing?: No Do you need assistance with meal preparation?: No Do you need assistance with eating?: No Do you have difficulty maintaining continence: No Do you need assistance with getting out of bed/getting out of a chair/moving?: No Do you have difficulty managing or taking your medications?: No  Follow up appointments reviewed: PCP Follow-up appointment confirmed?: Yes Date of PCP follow-up appointment?: 04/24/24 Follow-up Provider: [facility] Follow-up appointment confirmed?: Yes Date of Specialist follow-up appointment?: 05/12/24 Follow-Up Specialty Provider:: CARD Do you need transportation to your follow-up appointment?: No Do you understand care options if your condition(s) worsen?: Yes-patient verbalized understanding    SIGNATURE Kristeen Gladis, CMA

## 2024-04-24 ENCOUNTER — Encounter: Payer: Self-pay | Admitting: Nurse Practitioner

## 2024-04-24 ENCOUNTER — Ambulatory Visit: Admitting: Nurse Practitioner

## 2024-04-24 VITALS — BP 130/78 | HR 99 | Temp 98.5°F | Ht 70.0 in | Wt 361.8 lb

## 2024-04-24 DIAGNOSIS — R0683 Snoring: Secondary | ICD-10-CM

## 2024-04-24 DIAGNOSIS — D508 Other iron deficiency anemias: Secondary | ICD-10-CM

## 2024-04-24 DIAGNOSIS — E538 Deficiency of other specified B group vitamins: Secondary | ICD-10-CM

## 2024-04-24 DIAGNOSIS — Z09 Encounter for follow-up examination after completed treatment for conditions other than malignant neoplasm: Secondary | ICD-10-CM | POA: Diagnosis not present

## 2024-04-24 DIAGNOSIS — R946 Abnormal results of thyroid function studies: Secondary | ICD-10-CM

## 2024-04-24 DIAGNOSIS — I4892 Unspecified atrial flutter: Secondary | ICD-10-CM

## 2024-04-24 DIAGNOSIS — G4452 New daily persistent headache (NDPH): Secondary | ICD-10-CM

## 2024-04-24 DIAGNOSIS — I10 Essential (primary) hypertension: Secondary | ICD-10-CM | POA: Diagnosis not present

## 2024-04-24 MED ORDER — TRIAMCINOLONE ACETONIDE 40 MG/ML IJ SUSP
40.0000 mg | Freq: Once | INTRAMUSCULAR | Status: AC
  Administered 2024-04-24: 40 mg via INTRAMUSCULAR

## 2024-04-24 NOTE — Progress Notes (Signed)
 LILLETTE Kristeen JINNY Gladis, CMA,acting as a neurosurgeon for Yvonne Ada, FNP.,have documented all relevant documentation on the behalf of Yvonne Ada, FNP,as directed by  Yvonne Ada, FNP while in the presence of Yvonne Ada, FNP.  Subjective:  Patient ID: Yvonne Ali , female    DOB: 15-Sep-1980 , 43 y.o.   MRN: [id number]  Chief Complaint  Patient presents with   Hospitalization Follow-up    Patient presents today for a hospital follow up, patient reports compliance with medications. Patient denies any chest pain, SOB, or headaches. Patient was admitted 04/16/2024 and discharged on 04/18/2024. Patient diagnosed with Atrial flutter with rapid ventricular response (HCC). Patient reports today they are feeling a little better, she reports she still has a little SOB and flutters at time. She sees cardiology 05/12/2024. Patient reports she was told her thyroid  levels were off in the ER.     Snoring    Patient would also like to get a sleep study done she reports she does snore.     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   Headache  This is a new problem. The current episode started in the past 7 days. The problem has been gradually worsening. The pain is located in the Frontal region. The pain does not radiate. The pain is at a severity of 5/10. Pertinent negatives include no abdominal pain, dizziness, numbness or phonophobia.    Discussed the use of AI scribe software for clinical note transcription with the patient, who gave verbal consent to proceed.  History of Present Illness Yvonne Ali is a 43 year old female with atrial fibrillation and history of pulmonary embolism who presents with fatigue and shortness of breath.  She was hospitalized from November 5th to November 7th due to shortness of breath and weakness. During her hospital stay, she was found to be in atrial fibrillation with palpitations. She was treated with a Cardizem  drip and started on Eliquis . A cardioversion  was performed, and her thyroid  function was evaluated, revealing a low TSH level.  She experiences ongoing fatigue and intermittent shortness of breath, particularly when walking. She has occasional palpitations and chest pain, described as feeling like a heart attack. She also experiences slight headaches, rated as a 5 out of 10 in severity, located in the front of her head. She takes Tylenol  500 mg, which provides some relief.  Her iron  levels were found to be low, with a ferritin level of 17, and she was started on oral iron  supplements twice daily. She has not yet heard from the iron  infusion clinic. Her B12 level was also low at 330, and she has a history of gastric sleeve surgery, which may contribute to her vitamin deficiencies.  She has a history of pulmonary embolism and protein S deficiency, necessitating the use of blood thinners. She is currently on Eliquis . She also has a history of sleep apnea diagnosed 15 years ago, for which she used a CPAP machine, but she no longer has the device.  She works as a engineer, civil (consulting) on a variable schedule, typically 12-hour shifts, and is currently on leave. She drinks about 60 ounces of water daily. No nausea, vision changes, nasal congestion, or cough.  Past Medical History:  Diagnosis Date   Acute blood loss anemia 02/06/2015   COVID-19 vaccination declined 04/12/2023   Ectopic pregnancy    Encounter for screening mammogram for breast cancer 04/12/2023   Gestational hypertension, antepartum 02/03/2015   Iron  deficiency anemia of  pregnancy 02/06/2015   PE (pulmonary embolism)    Postpartum care following vaginal delivery (2/2) 07/15/2013   Pregnancy induced hypertension    SVD (spontaneous vaginal delivery) 07/22/2018     Family History  Problem Relation Age of Onset   Breast cancer Mother 30   Cancer Mother    Hypertension Maternal Grandfather    Hyperlipidemia Maternal Grandfather    Diabetes Paternal Grandmother    Ovarian cancer Paternal  Grandmother 29   Early death Brother    BRCA 1/2 Neg Hx      Current Outpatient Medications:    acetaminophen  (TYLENOL ) 500 MG tablet, Take 500 mg by mouth every 6 (six) hours as needed for headache or fever (pain)., Disp: , Rfl:    apixaban  (ELIQUIS ) 5 MG TABS tablet, Take 1 tablet (5 mg total) by mouth 2 (two) times daily., Disp: 60 tablet, Rfl: 3   diltiazem  (CARDIZEM  CD) 120 MG 24 hr capsule, Take 1 capsule (120 mg total) by mouth daily., Disp: 30 capsule, Rfl: 3   ferrous sulfate  325 (65 FE) MG EC tablet, Take 1 tablet (325 mg total) by mouth 2 (two) times daily., Disp: 60 tablet, Rfl: 3   meclizine  (ANTIVERT ) 25 MG tablet, Take 1 tablet (25 mg total) by mouth 3 (three) times daily as needed for dizziness., Disp: 30 tablet, Rfl: 0   Multiple Vitamins-Minerals (MULTIVITAMIN WOMEN) TABS, Take 1 tablet by mouth daily., Disp: , Rfl:    triamcinolone  cream (KENALOG ) 0.1 %, Apply 1 Application topically 2 (two) times daily. (Patient taking differently: Apply 1 Application topically 2 (two) times daily as needed (skin irritation).), Disp: 30 g, Rfl: 0   No Known Allergies   Review of Systems  Constitutional:  Positive for fatigue.  Respiratory: Negative.    Cardiovascular: Negative.   Gastrointestinal:  Negative for abdominal pain.  Neurological:  Positive for headaches. Negative for dizziness, light-headedness and numbness.  Psychiatric/Behavioral: Negative.       Today's Vitals   04/24/24 1416  BP: 130/78  Pulse: 99  Temp: 98.5 F (36.9 C)  TempSrc: Oral  Weight: (!) 361 lb 12.8 oz (164.1 kg)  Height: 5' 10 (1.778 m)  PainSc: 0-No pain   Body mass index is 51.91 kg/m.  Wt Readings from Last 3 Encounters:  04/24/24 (!) 361 lb 12.8 oz (164.1 kg)  04/18/24 (!) 355 lb (161 kg)  03/16/24 (!) 350 lb (158.8 kg)     Objective:  Physical Exam Vitals and nursing note reviewed.  Constitutional:      General: She is not in acute distress.    Appearance: Normal appearance.   Cardiovascular:     Rate and Rhythm: Normal rate and regular rhythm.     Pulses: Normal pulses.     Heart sounds: Normal heart sounds. No murmur heard. Pulmonary:     Effort: Pulmonary effort is normal. No respiratory distress.     Breath sounds: Normal breath sounds. No wheezing.  Skin:    General: Skin is warm and dry.     Capillary Refill: Capillary refill takes less than 2 seconds.  Neurological:     General: No focal deficit present.     Mental Status: She is alert and oriented to person, place, and time.     Cranial Nerves: No cranial nerve deficit.      Assessment And Plan:   Assessment & Plan Atrial flutter with rapid ventricular response (HCC) Recent hospitalization for atrial fibrillation with palpitations and chest pain. Underwent cardioversion and started  on Eliquis . Currently experiencing occasional flutters and shortness of breath. Blood pressure is well-managed. - Continue Eliquis  as prescribed. - Follow up with cardiology on December 1st for further evaluation and clearance to return to work. Hospital discharge follow-up  Essential hypertension Blood pressure is controlled, continue current medications and f/u with Cardiology Abnormal thyroid  function test Low TSH indicating possible hyperthyroidism, which may contribute to irregular heart rhythm. - Will repeat thyroid  function tests in 4-6 weeks. Snoring History of sleep apnea diagnosed 15 years ago. Currently experiencing snoring and suspects sleep apnea. - Ordered home sleep study through [facility]. - Instructed to follow up if no contact from sleep study company within a week. New daily persistent headache Experiencing new onset headaches, rated 5/10 in severity, located in the frontal region. - Administered Toradol injection to break the headache cycle. - Prescribed a short course of steroids to be used if headache persists over the weekend. 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 Currently taking oral iron  supplements twice daily. Awaiting contact from iron  infusion clinic. - Continue oral iron  supplements twice daily. Vitamin B12 deficiency Vitamin B12 level is low normal, possibly contributing to fatigue. History of gastric sleeve may affect vitamin absorption. - Administered vitamin B12 injection today. - Will consider weekly vitamin B12 injections for 3-4 weeks to improve energy levels.  Orders Placed This Encounter  Procedures   TSH + free T4   Ambulatory referral to Sleep Studies    Return in about 3 months (around 07/25/2024) for phy when able; lab visit week of Dec 1st.  Patient was given opportunity to ask questions. Patient verbalized understanding of the plan and was able to repeat key elements of the plan. All questions were answered to their satisfaction.    LILLETTE Yvonne Ada, FNP, have reviewed all documentation for this visit. The documentation on 04/24/24 for the exam, diagnosis, procedures, and orders are all accurate and complete.   IF YOU HAVE BEEN REFERRED TO A SPECIALIST, IT MAY TAKE 1-2 WEEKS TO SCHEDULE/PROCESS THE REFERRAL. IF YOU HAVE NOT HEARD FROM US /SPECIALIST IN TWO WEEKS, PLEASE GIVE US  A CALL AT [phone].

## 2024-04-28 ENCOUNTER — Telehealth: Payer: Self-pay

## 2024-04-28 ENCOUNTER — Encounter: Payer: Self-pay | Admitting: Nurse Practitioner

## 2024-04-28 NOTE — Telephone Encounter (Signed)
 Copied from CRM #[id number]. Topic: General - Other >> Apr 28, 2024  3:39 PM Lauren C wrote: Reason for CRM: FYI- Patient will be sending 2 more pages she needs filled out for [facility] through [place] under ask medical question. She needs the pages filled out as soon as possible to finish her claim.

## 2024-04-28 NOTE — Telephone Encounter (Signed)
 Copied from CRM #[id number]. Topic: General - Other >> Apr 25, 2024 11:18 AM Charlet HERO wrote: Reason for CRM: Patient is calling about FMLA paperwork the start date should be 11/04 instead of the 04/16/2024. If you have any questions please reach to the patient.

## 2024-04-30 ENCOUNTER — Ambulatory Visit: Payer: Self-pay | Admitting: Nurse Practitioner

## 2024-04-30 NOTE — Assessment & Plan Note (Signed)
 Low ferritin and iron  levels contributing to fatigue. Currently taking oral iron  supplements twice daily. Awaiting contact from iron  infusion clinic. - Continue oral iron  supplements twice daily.

## 2024-04-30 NOTE — Assessment & Plan Note (Signed)
 Recent hospitalization for atrial fibrillation with palpitations and chest pain. Underwent cardioversion and started on Eliquis. Currently experiencing occasional flutters and shortness of breath. Blood pressure is well-managed. - Continue Eliquis as prescribed. - Follow up with cardiology on December 1st for further evaluation and clearance to return to work.

## 2024-04-30 NOTE — Assessment & Plan Note (Signed)
Blood pressure is controlled, continue current medications and f/u with Cardiology

## 2024-05-02 ENCOUNTER — Ambulatory Visit (HOSPITAL_COMMUNITY): Admitting: Physician Assistant

## 2024-05-04 DIAGNOSIS — R9431 Abnormal electrocardiogram [ECG] [EKG]: Secondary | ICD-10-CM | POA: Diagnosis not present

## 2024-05-04 DIAGNOSIS — R002 Palpitations: Secondary | ICD-10-CM | POA: Diagnosis not present

## 2024-05-04 DIAGNOSIS — E538 Deficiency of other specified B group vitamins: Secondary | ICD-10-CM | POA: Insufficient documentation

## 2024-05-04 DIAGNOSIS — R0683 Snoring: Secondary | ICD-10-CM | POA: Insufficient documentation

## 2024-05-04 DIAGNOSIS — R946 Abnormal results of thyroid function studies: Secondary | ICD-10-CM | POA: Insufficient documentation

## 2024-05-04 DIAGNOSIS — G4452 New daily persistent headache (NDPH): Secondary | ICD-10-CM | POA: Insufficient documentation

## 2024-05-04 DIAGNOSIS — I517 Cardiomegaly: Secondary | ICD-10-CM | POA: Diagnosis not present

## 2024-05-04 DIAGNOSIS — R0989 Other specified symptoms and signs involving the circulatory and respiratory systems: Secondary | ICD-10-CM | POA: Diagnosis not present

## 2024-05-04 DIAGNOSIS — R000 Tachycardia, unspecified: Secondary | ICD-10-CM | POA: Diagnosis not present

## 2024-05-04 NOTE — Assessment & Plan Note (Signed)
 Experiencing new onset headaches, rated 5/10 in severity, located in the frontal region. - Administered Toradol injection to break the headache cycle. - Prescribed a short course of steroids to be used if headache persists over the weekend.

## 2024-05-04 NOTE — Assessment & Plan Note (Signed)
 Low TSH indicating possible hyperthyroidism, which may contribute to irregular heart rhythm. - Will repeat thyroid  function tests in 4-6 weeks.

## 2024-05-04 NOTE — Assessment & Plan Note (Signed)
 Vitamin B12 level is low normal, possibly contributing to fatigue. History of gastric sleeve may affect vitamin absorption. - Administered vitamin B12 injection today. - Will consider weekly vitamin B12 injections for 3-4 weeks to improve energy levels.

## 2024-05-04 NOTE — Assessment & Plan Note (Signed)
 History of sleep apnea diagnosed 15 years ago. Currently experiencing snoring and suspects sleep apnea. - Ordered home sleep study through [facility]. - Instructed to follow up if no contact from sleep study company within a week.

## 2024-05-05 ENCOUNTER — Telehealth (HOSPITAL_COMMUNITY): Payer: Self-pay | Admitting: *Deleted

## 2024-05-05 MED ORDER — DILTIAZEM HCL 30 MG PO TABS: 30.0000 mg | ORAL_TABLET | ORAL | 1 refills | Status: DC | PRN

## 2024-05-05 NOTE — Telephone Encounter (Signed)
 Pt called stating is scheduled to see afib clinic on  Monday - she is current in [place] - was in ER up there yesterday back in af they gave IV diltiazem  and she converted back to NSR -- she is calling with elevated rates again 110s - BP 190-110 currently. Discussed with Daril Kicks PA will call in Cardizem  30mg  to use every 4hours as needed for elevated rates. Pt verbalized understanding.

## 2024-05-09 NOTE — Unmapped External Note (Signed)
 Post Acute Care Call Documentation  Patient was sent a post encounter questionnaire with [facility] contact information for any questions or follow up concerns.   Message Sent Time 05/05/2024 8:00 AM

## 2024-05-12 ENCOUNTER — Other Ambulatory Visit (HOSPITAL_COMMUNITY): Payer: Self-pay | Admitting: *Deleted

## 2024-05-12 ENCOUNTER — Other Ambulatory Visit (HOSPITAL_COMMUNITY): Payer: Self-pay

## 2024-05-12 ENCOUNTER — Ambulatory Visit (HOSPITAL_COMMUNITY)
Admission: RE | Admit: 2024-05-12 | Discharge: 2024-05-12 | Disposition: A | Source: Ambulatory Visit | Attending: Physician Assistant | Admitting: Physician Assistant

## 2024-05-12 ENCOUNTER — Encounter (HOSPITAL_COMMUNITY): Payer: Self-pay | Admitting: Physician Assistant

## 2024-05-12 ENCOUNTER — Encounter (HOSPITAL_COMMUNITY): Payer: Self-pay | Admitting: Internal Medicine

## 2024-05-12 ENCOUNTER — Other Ambulatory Visit

## 2024-05-12 ENCOUNTER — Telehealth: Payer: Self-pay

## 2024-05-12 VITALS — BP 120/88 | HR 91 | Ht 70.0 in | Wt 357.8 lb

## 2024-05-12 DIAGNOSIS — I10 Essential (primary) hypertension: Secondary | ICD-10-CM | POA: Diagnosis not present

## 2024-05-12 DIAGNOSIS — D6859 Other primary thrombophilia: Secondary | ICD-10-CM

## 2024-05-12 DIAGNOSIS — I4891 Unspecified atrial fibrillation: Secondary | ICD-10-CM

## 2024-05-12 DIAGNOSIS — I480 Paroxysmal atrial fibrillation: Secondary | ICD-10-CM

## 2024-05-12 DIAGNOSIS — Z6841 Body Mass Index (BMI) 40.0 and over, adult: Secondary | ICD-10-CM

## 2024-05-12 MED ORDER — DILTIAZEM HCL 30 MG PO TABS
30.0000 mg | ORAL_TABLET | ORAL | 1 refills | Status: AC | PRN
  Filled 2024-05-12: qty 45, 8d supply, fill #0

## 2024-05-12 MED ORDER — DILTIAZEM HCL ER COATED BEADS 120 MG PO CP24
120.0000 mg | ORAL_CAPSULE | Freq: Every day | ORAL | 3 refills | Status: AC
  Filled 2024-05-12: qty 30, 30d supply, fill #0

## 2024-05-12 MED ORDER — APIXABAN 5 MG PO TABS
5.0000 mg | ORAL_TABLET | Freq: Two times a day (BID) | ORAL | 3 refills | Status: AC
  Filled 2024-05-12 – 2024-06-18 (×3): qty 60, 30d supply, fill #0
  Filled 2024-07-10 – 2024-07-17 (×2): qty 60, 30d supply, fill #1

## 2024-05-12 NOTE — Addendum Note (Signed)
 Addended by: GEORGINA SPEAKS F on: 05/12/2024 03:50 PM   Modules accepted: Level of Service

## 2024-05-12 NOTE — Progress Notes (Signed)
 Primary Care Physician: Yvonne Speaks, FNP Primary Cardiologist: None Electrophysiologist: None  Referring Physician: Dr. Delford Roanne Yvonne Ali is a 43 y.o. female with a history of paroxysmal AF (on Eliquis ), protein S deficiency, pulmonary embolism, OSA (not on CPAP), HTN, obesity s/p gastric sleeve 2011, who presents for consultation in the [facility].  She presented to the ED on 04/16/2024 with complaint of fatigue and shortness of breath x 3 days.  She was found to be in new onset  AF and was started on Cardizem  drip with subsequent TEE and DCCV with conversion to sinus rhythm.  Echo showed normal EF with moderate LVH.  She has previous diagnosis of OSA and has been intolerant to CPAP for over 15 years.  She presented to the ED on 05/04/2024 while traveling for the holidays to [place]  with complaint of palpitations.  She was found to be back in atrial fibrillation and was treated with IV fluids and.Cardizem  with subsequent conversion to sinus rhythm. She contacted the AF clinic on 05/05/24 with complaint of  elevated heart rates and was given a prescription for Cardizem  30 mg every 4 hours as needed.  [name] presents today with her husband for posthospital follow-up of new onset atrial fibrillation. She reports since her hospitalization that she has been doing well but has experienced a few episodes of tachycardia that have responded well to as needed Cardizem . She reports compliance with her Eliquis  and denies any bleeding concerns.  During today's visit we discussed the pathophysiology of atrial fibrillation as well as medication options for management.  She was provided 2 options with Multaq as well as Tikosyn with her history of moderate LVH.  She is not a candidate for ablation at this time due to elevated BMI of 51.  We also discussed triggers for atrial fibrillation such as alcohol, sleep apnea, and weight management.  She is scheduled to have a sleep  study with her PCP.  Today, she denies symptoms of palpitations, chest pain, shortness of breath, orthopnea, PND, lower extremity edema, dizziness, presyncope, syncope, snoring, daytime somnolence, bleeding, or neurologic sequela. The patient is tolerating medications without difficulties and is otherwise without complaint today.    Atrial Fibrillation Risk Factors:  she does have symptoms or diagnosis of sleep apnea. she does not have a history of rheumatic fever. she does not have a history of alcohol use. The patient does not have a history of early familial atrial fibrillation or other arrhythmias.  Atrial Fibrillation Management history:  Previous antiarrhythmic drugs: None Previous cardioversions: 04/18/2024 Previous ablations: None Anticoagulation history: Eliquis   ROS- All systems are reviewed and negative except as per the HPI above.  Past Medical History:  Diagnosis Date   Acute blood loss anemia 02/06/2015   COVID-19 vaccination declined 04/12/2023   Ectopic pregnancy    Encounter for screening mammogram for breast cancer 04/12/2023   Gestational hypertension, antepartum 02/03/2015   Iron  deficiency anemia of pregnancy 02/06/2015   PE (pulmonary embolism)    Postpartum care following vaginal delivery (2/2) 07/15/2013   Pregnancy induced hypertension    SVD (spontaneous vaginal delivery) 07/22/2018    Current Outpatient Medications  Medication Sig Dispense Refill   acetaminophen  (TYLENOL ) 500 MG tablet Take 500 mg by mouth every 6 (six) hours as needed for headache or fever (pain). (Patient taking differently: Take 500 mg by mouth as needed for headache or fever (pain).)     apixaban  (ELIQUIS ) 5 MG TABS tablet Take  1 tablet (5 mg total) by mouth 2 (two) times daily. 60 tablet 3   diltiazem  (CARDIZEM  CD) 120 MG 24 hr capsule Take 1 capsule (120 mg total) by mouth daily. 30 capsule 3   diltiazem  (CARDIZEM ) 30 MG tablet Take 1 tablet every 4 hours AS NEEDED for AFIB  heart rate >100 as long as top BP >100. 45 tablet 1   ferrous sulfate  325 (65 FE) MG EC tablet Take 1 tablet (325 mg total) by mouth 2 (two) times daily. 60 tablet 3   meclizine  (ANTIVERT ) 25 MG tablet Take 1 tablet (25 mg total) by mouth 3 (three) times daily as needed for dizziness. 30 tablet 0   Multiple Vitamins-Minerals (MULTIVITAMIN WOMEN) TABS Take 1 tablet by mouth daily.     triamcinolone  cream (KENALOG ) 0.1 % Apply 1 Application topically 2 (two) times daily. 30 g 0   No current facility-administered medications for this encounter.    Physical Exam: BP 120/88   Pulse 91   Ht 5' 10 (1.778 m)   Wt (!) 162.3 kg   LMP 04/18/2024 (Approximate)   BMI 51.34 kg/m   GEN: Well nourished, well developed in no acute distress NECK: No JVD; No carotid bruits CARDIAC: Regular rate and rhythm, no murmurs, rubs, gallops RESPIRATORY:  Clear to auscultation without rales, wheezing or rhonchi  ABDOMEN: Soft, non-tender, non-distended EXTREMITIES:  No edema; No deformity   Wt Readings from Last 3 Encounters:  05/12/24 (!) 162.3 kg  04/24/24 (!) 164.1 kg  04/18/24 (!) 161 kg     EKG today demonstrates:   EKG Interpretation Date/Time:  Monday May 12 2024 09:37:37 EST Ventricular Rate:  91 PR Interval:  152 QRS Duration:  84 QT Interval:  350 QTC Calculation: 430 R Axis:   89  Text Interpretation: Normal sinus rhythm Normal ECG When compared with ECG of 18-Apr-2024 09:48, No significant change was found Confirmed by Wyn Manus [phone]) on 05/12/2024 11:09:42 AM        Echo completed on 04/17/2024 that demonstrated  Left ventricular ejection fraction, by estimation, is 60 to 65%. The  left ventricle has normal function. The left ventricle has no regional  wall motion abnormalities. There is moderate concentric left ventricular  hypertrophy. Left ventricular  diastolic function could not be evaluated.   2. Right ventricular systolic function is normal. The right ventricular   size is normal.   3. Left atrial size was moderately dilated.   4. The mitral valve is normal in structure. Mild mitral valve  regurgitation. No evidence of mitral stenosis.   5. The aortic valve is tricuspid. There is mild calcification of the  aortic valve. Aortic valve regurgitation is not visualized. No aortic  stenosis is present.   6. The inferior vena cava is normal in size with greater than 50%  respiratory variability, suggesting right atrial pressure of 3 mmHg.   CHA2DS2-VASc Score = 4  The patient's score is based upon: CHF History: 0 HTN History: 1 Diabetes History: 0 Stroke History: 2 Vascular Disease History: 0 Age Score: 0 Gender Score: 1       ASSESSMENT AND PLAN: Paroxysmal Atrial Fibrillation (ICD10:  I48.0) -The patient's CHA2DS2-VASc score is 4, indicating a 4.8% annual risk of stroke.  -Continue Eliquis  5 mg twice daily - New onset AF with TEE/DCCV with conversion to sinus rhythm  - Patient is not a candidate for ablation due to elevated BMI- -Not a candidate for class Ic agents due to history of LVH. -  She is interested in pursuing Tikosyn and will contact our office to arrange time for initiating medication. -Patient's recent magnesium  was stable at 2.2 and CrCl is 68.97 - Continue Cardizem  120 mg daily and as needed Cardizem  30 mg every 4 hours as needed for heart rate greater than 100 and BP greater than 100 systolic.  Essential HTN: - Well-controlled today -Continue Cardizem  120 mg daily  Hx of OSA: - Patient is scheduled to have sleep study with PCP  Obesity: -s/p gastric sleeve in 2011 BMI is 51.08  History of PE/protein S deficiency: - Continue Eliquis  5 mg twice daily     Follow up: Will call back clinic to schedule Tikosyn admission   [facility] [address] [place], [place] [address] [phone]

## 2024-05-12 NOTE — Telephone Encounter (Signed)
 Copied from CRM #[id number]. Topic: Clinical - Medication Question >> May 12, 2024  1:14 PM Drema MATSU wrote: Reason for CRM: Patient stated that Dr. Quita Kicks is recommending her to She is needing her medication changed from diltiazem  (CARDIZEM  CD) 120 MG 24 hr capsule and diltiazem  (CARDIZEM ) 30 MG tablet changed to Tikosyn and for that she will have to inpatient in order to get it. Patient will let provider know when a bed is available.

## 2024-05-13 ENCOUNTER — Other Ambulatory Visit (HOSPITAL_COMMUNITY): Payer: Self-pay

## 2024-05-13 ENCOUNTER — Telehealth: Payer: Self-pay

## 2024-05-13 ENCOUNTER — Ambulatory Visit (HOSPITAL_COMMUNITY)
Admission: RE | Admit: 2024-05-13 | Discharge: 2024-05-13 | Disposition: A | Source: Ambulatory Visit | Attending: Internal Medicine

## 2024-05-13 ENCOUNTER — Other Ambulatory Visit

## 2024-05-13 ENCOUNTER — Telehealth: Payer: Self-pay | Admitting: Pharmacist

## 2024-05-13 VITALS — BP 140/81 | HR 79 | Temp 97.9°F | Resp 16

## 2024-05-13 DIAGNOSIS — R946 Abnormal results of thyroid function studies: Secondary | ICD-10-CM

## 2024-05-13 DIAGNOSIS — D509 Iron deficiency anemia, unspecified: Secondary | ICD-10-CM | POA: Diagnosis present

## 2024-05-13 MED ORDER — SODIUM CHLORIDE 0.9 % IV SOLN
510.0000 mg | Freq: Once | INTRAVENOUS | Status: AC
  Administered 2024-05-13: 510 mg via INTRAVENOUS
  Filled 2024-05-13: qty 510

## 2024-05-13 MED ORDER — SODIUM CHLORIDE 0.9 % IV SOLN: INTRAVENOUS | Status: DC | PRN

## 2024-05-13 NOTE — Telephone Encounter (Signed)
 Called asking if Yvonne Ali could do her extension for her FMLA after asking Yvonne Ali she is now under the care of her cardiologist and he will need to continue her FMLA extension papers

## 2024-05-13 NOTE — Progress Notes (Signed)
 PATIENT CARE CENTER NOTE   Diagnosis: Iron  deficiency anemia, unspecified iron  deficiency anemia type [D50.9]    Provider:   Verdene Purchase, MD    Procedure: Feraheme  infusion    Note: Patient received Feraheme  infusion (dose # 1 of 2) via PIV. Patient tolerated infusion well with no adverse reaction. Observed patient for 30 minutes post infusion. Vital signs stable. AVS reviewed with patient. Patient to come back in 1 week for second infusion and will schedule her next appointment at the front desk. Patient alert, oriented and ambulatory at discharge.

## 2024-05-13 NOTE — Telephone Encounter (Signed)
 Confirm if the patient is just providing an update

## 2024-05-13 NOTE — Telephone Encounter (Signed)
 Medication list reviewed in anticipation of upcoming Tikosyn initiation. Patient is not taking any contraindicated medications but is taking a couple QTc prolonging medications.   Diltiazem : can increase the concentration of Tikosyn. Monitor for toxicity including QTc prolongation  Meclizine : Can increase the risk of QTc prolongation. Patient should minimize use as much as possible (written as prn)  Patient is anticoagulated on Eliquis  on the appropriate dose. Please ensure that patient has not missed any anticoagulation doses in the 3 weeks prior to Tikosyn initiation.   Patient will need to be counseled to avoid use of Benadryl  while on Tikosyn and in the 2-3 days prior to Tikosyn initiation.

## 2024-05-14 ENCOUNTER — Other Ambulatory Visit: Payer: Self-pay | Admitting: Nurse Practitioner

## 2024-05-14 ENCOUNTER — Encounter (HOSPITAL_COMMUNITY): Payer: Self-pay | Admitting: *Deleted

## 2024-05-14 ENCOUNTER — Telehealth (HOSPITAL_COMMUNITY): Payer: Self-pay

## 2024-05-14 ENCOUNTER — Other Ambulatory Visit

## 2024-05-14 ENCOUNTER — Ambulatory Visit: Payer: Self-pay | Admitting: Nurse Practitioner

## 2024-05-14 ENCOUNTER — Other Ambulatory Visit (HOSPITAL_COMMUNITY): Payer: Self-pay

## 2024-05-14 DIAGNOSIS — R946 Abnormal results of thyroid function studies: Secondary | ICD-10-CM

## 2024-05-14 LAB — TSH+FREE T4
Free T4: 1.58 ng/dL (ref 0.82–1.77)
TSH: 0.115 u[IU]/mL — ABNORMAL LOW (ref 0.450–4.500)

## 2024-05-14 NOTE — Telephone Encounter (Signed)
 Pharmacy Patient Advocate Encounter  Insurance verification completed.    The patient is insured through [facility]. Patient has [employer], may use a copay card, and/or apply for patient assistance if available.    Ran test claim for Dofetilide 500mcg capsule and the current 30 day co-pay is $19.07.   This test claim was processed through [facility]- copay amounts may vary at other pharmacies due to pharmacy/plan contracts, or as the patient moves through the different stages of their insurance plan.

## 2024-05-14 NOTE — Telephone Encounter (Signed)
 Initiated prior authorization to [facility] for Tikosyn admission. Date of service: 05/26/2024 Pending Reference # [id number] Fax # [phone] Phone # [phone] Faxed clinical information for review.

## 2024-05-20 ENCOUNTER — Inpatient Hospital Stay (HOSPITAL_COMMUNITY): Admission: RE | Admit: 2024-05-20 | Discharge: 2024-05-20 | Attending: Internal Medicine

## 2024-05-20 ENCOUNTER — Telehealth (HOSPITAL_COMMUNITY): Payer: Self-pay | Admitting: *Deleted

## 2024-05-20 VITALS — BP 155/97 | HR 82 | Temp 97.8°F | Resp 18

## 2024-05-20 DIAGNOSIS — D509 Iron deficiency anemia, unspecified: Secondary | ICD-10-CM

## 2024-05-20 MED ORDER — SODIUM CHLORIDE 0.9 % IV SOLN
510.0000 mg | Freq: Once | INTRAVENOUS | Status: AC
  Administered 2024-05-20: 510 mg via INTRAVENOUS
  Filled 2024-05-20: qty 17

## 2024-05-20 NOTE — Progress Notes (Signed)
 Diagnosis: Iron  deficiency anemia , unspecified iron  deficiency anemia type [D50.9]    Provider: Krishnan, Gokul, MD    Procedure: Feraheme  infuision    Note: Patient received Feraheme  infusion (2 of 2) via PIV.  Patient tolerated infusion well with no adverse reaction.  Observed patient for 30 minutes post infusion.  Vital signs stable.  AVS reviewed with patient  Patient to follow with PCP for blood work.  Patient alert, oriented and ambulatory at discharge.

## 2024-05-20 NOTE — Telephone Encounter (Signed)
 Pt was due to get admitted for Tikosyn 05/26/24. She had abnormal TSH level on most recent labs. R.Fenton P.A. suggested postponing Tikosyn  admission until after she gets follow up for TSH. Pt does have appt with endocrinologist and will call us  back after for an update. Future date for Tikosyn admission then will be determined.

## 2024-05-21 ENCOUNTER — Other Ambulatory Visit: Payer: Self-pay | Admitting: Nurse Practitioner

## 2024-05-21 ENCOUNTER — Other Ambulatory Visit (HOSPITAL_COMMUNITY): Payer: Self-pay

## 2024-05-21 ENCOUNTER — Other Ambulatory Visit (HOSPITAL_COMMUNITY): Payer: Self-pay | Admitting: *Deleted

## 2024-05-21 DIAGNOSIS — D508 Other iron deficiency anemias: Secondary | ICD-10-CM

## 2024-05-21 MED ORDER — DRONEDARONE HCL 400 MG PO TABS
400.0000 mg | ORAL_TABLET | Freq: Two times a day (BID) | ORAL | 3 refills | Status: AC
  Filled 2024-05-21: qty 60, 30d supply, fill #0
  Filled 2024-06-17 – 2024-06-18 (×2): qty 60, 30d supply, fill #1
  Filled 2024-07-10 – 2024-07-17 (×2): qty 60, 30d supply, fill #2

## 2024-05-21 NOTE — Progress Notes (Signed)
 Pt called and requested to start Multaq  at this time instead of tilkosyn. Spoke with R. Fenton P.A. he agrees to Multaq  400 mg bid and EKG in one week. Pt agrees and understands.

## 2024-05-22 ENCOUNTER — Other Ambulatory Visit (HOSPITAL_COMMUNITY): Payer: Self-pay

## 2024-05-26 ENCOUNTER — Ambulatory Visit (HOSPITAL_COMMUNITY): Admitting: Internal Medicine

## 2024-05-28 ENCOUNTER — Ambulatory Visit (HOSPITAL_COMMUNITY)
Admission: RE | Admit: 2024-05-28 | Discharge: 2024-05-28 | Attending: Physician Assistant | Admitting: Physician Assistant

## 2024-05-28 ENCOUNTER — Other Ambulatory Visit: Payer: Self-pay

## 2024-05-28 VITALS — HR 82

## 2024-05-28 DIAGNOSIS — I4891 Unspecified atrial fibrillation: Secondary | ICD-10-CM

## 2024-05-28 DIAGNOSIS — D508 Other iron deficiency anemias: Secondary | ICD-10-CM | POA: Diagnosis not present

## 2024-05-28 NOTE — Progress Notes (Signed)
 Patient returns for ECG after starting Multaq . ECG shows:   EKG Interpretation Date/Time:  Wednesday May 28 2024 09:50:15 EST Ventricular Rate:  82 PR Interval:  154 QRS Duration:  78 QT Interval:  388 QTC Calculation: 453 R Axis:   86  Text Interpretation: Normal sinus rhythm Nonspecific T wave abnormality Abnormal ECG When compared with ECG of 12-May-2024 09:37, No significant change was found Confirmed by Amahri Dengel ([id number]) on 05/28/2024 9:55:24 AM    Follow up in the AF clinic in 3 months. Will need cmet at that time.

## 2024-05-29 ENCOUNTER — Ambulatory Visit: Payer: Self-pay | Admitting: Nurse Practitioner

## 2024-05-29 LAB — IRON,TIBC AND FERRITIN PANEL
Ferritin: 152 ng/mL — ABNORMAL HIGH (ref 15–150)
Iron Saturation: 23 % (ref 15–55)
Iron: 60 ug/dL (ref 27–159)
Total Iron Binding Capacity: 260 ug/dL (ref 250–450)
UIBC: 200 ug/dL (ref 131–425)

## 2024-06-06 ENCOUNTER — Emergency Department (HOSPITAL_BASED_OUTPATIENT_CLINIC_OR_DEPARTMENT_OTHER)
Admission: EM | Admit: 2024-06-06 | Discharge: 2024-06-06 | Disposition: A | Discharge status: Home / Self Care | Attending: Emergency Medicine | Admitting: Emergency Medicine

## 2024-06-06 ENCOUNTER — Ambulatory Visit (HOSPITAL_COMMUNITY): Admitting: Physician Assistant

## 2024-06-06 ENCOUNTER — Encounter (HOSPITAL_BASED_OUTPATIENT_CLINIC_OR_DEPARTMENT_OTHER): Payer: Self-pay | Admitting: Emergency Medicine

## 2024-06-06 ENCOUNTER — Emergency Department (HOSPITAL_BASED_OUTPATIENT_CLINIC_OR_DEPARTMENT_OTHER): Admitting: Radiology

## 2024-06-06 ENCOUNTER — Other Ambulatory Visit: Payer: Self-pay

## 2024-06-06 DIAGNOSIS — Z7901 Long term (current) use of anticoagulants: Secondary | ICD-10-CM | POA: Insufficient documentation

## 2024-06-06 DIAGNOSIS — R6883 Chills (without fever): Secondary | ICD-10-CM | POA: Diagnosis not present

## 2024-06-06 DIAGNOSIS — I10 Essential (primary) hypertension: Secondary | ICD-10-CM | POA: Diagnosis not present

## 2024-06-06 DIAGNOSIS — R42 Dizziness and giddiness: Secondary | ICD-10-CM | POA: Diagnosis not present

## 2024-06-06 DIAGNOSIS — R7989 Other specified abnormal findings of blood chemistry: Secondary | ICD-10-CM | POA: Diagnosis not present

## 2024-06-06 DIAGNOSIS — R000 Tachycardia, unspecified: Secondary | ICD-10-CM | POA: Diagnosis not present

## 2024-06-06 DIAGNOSIS — F419 Anxiety disorder, unspecified: Secondary | ICD-10-CM | POA: Insufficient documentation

## 2024-06-06 DIAGNOSIS — R0789 Other chest pain: Secondary | ICD-10-CM | POA: Diagnosis not present

## 2024-06-06 DIAGNOSIS — R531 Weakness: Secondary | ICD-10-CM | POA: Diagnosis not present

## 2024-06-06 DIAGNOSIS — R450 Nervousness: Secondary | ICD-10-CM | POA: Insufficient documentation

## 2024-06-06 LAB — COMPREHENSIVE METABOLIC PANEL WITH GFR
ALT: 16 U/L (ref 0–44)
AST: 15 U/L (ref 15–41)
Albumin: 4.1 g/dL (ref 3.5–5.0)
Alkaline Phosphatase: 69 U/L (ref 38–126)
Anion gap: 8 (ref 5–15)
BUN: 16 mg/dL (ref 6–20)
CO2: 25 mmol/L (ref 22–32)
Calcium: 9.9 mg/dL (ref 8.9–10.3)
Chloride: 105 mmol/L (ref 98–111)
Creatinine, Ser: 0.92 mg/dL (ref 0.44–1.00)
GFR, Estimated: >60 mL/min
Glucose, Bld: 101 mg/dL — ABNORMAL HIGH (ref 70–99)
Potassium: 4.1 mmol/L (ref 3.5–5.1)
Sodium: 139 mmol/L (ref 135–145)
Total Bilirubin: 0.7 mg/dL (ref 0.0–1.2)
Total Protein: 8 g/dL (ref 6.5–8.1)

## 2024-06-06 LAB — CBC WITH DIFFERENTIAL/PLATELET
Abs Immature Granulocytes: 0 K/uL (ref 0.00–0.07)
Basophils Absolute: 0 K/uL (ref 0.0–0.1)
Basophils Relative: 1 %
Eosinophils Absolute: 0 K/uL (ref 0.0–0.5)
Eosinophils Relative: 1 %
HCT: 37.7 % (ref 36.0–46.0)
Hemoglobin: 11.7 g/dL — ABNORMAL LOW (ref 12.0–15.0)
Immature Granulocytes: 0 %
Lymphocytes Relative: 33 %
Lymphs Abs: 1.7 K/uL (ref 0.7–4.0)
MCH: 25.6 pg — ABNORMAL LOW (ref 26.0–34.0)
MCHC: 31 g/dL (ref 30.0–36.0)
MCV: 82.5 fL (ref 80.0–100.0)
Monocytes Absolute: 0.4 K/uL (ref 0.1–1.0)
Monocytes Relative: 7 %
Neutro Abs: 3.1 K/uL (ref 1.7–7.7)
Neutrophils Relative %: 58 %
Platelets: 262 K/uL (ref 150–400)
RBC: 4.57 MIL/uL (ref 3.87–5.11)
RDW: 24.1 % — ABNORMAL HIGH (ref 11.5–15.5)
WBC: 5.3 K/uL (ref 4.0–10.5)
nRBC: 0 % (ref 0.0–0.2)

## 2024-06-06 LAB — TSH: TSH: 0.263 u[IU]/mL — ABNORMAL LOW (ref 0.350–4.500)

## 2024-06-06 LAB — MAGNESIUM: Magnesium: 2.2 mg/dL (ref 1.7–2.4)

## 2024-06-06 LAB — PRO BRAIN NATRIURETIC PEPTIDE: Pro Brain Natriuretic Peptide: 137 pg/mL

## 2024-06-06 LAB — RESP PANEL BY RT-PCR (RSV, FLU A&B, COVID)  RVPGX2
Influenza A by PCR: NEGATIVE
Influenza B by PCR: NEGATIVE
Resp Syncytial Virus by PCR: NEGATIVE
SARS Coronavirus 2 by RT PCR: NEGATIVE

## 2024-06-06 LAB — HCG, SERUM, QUALITATIVE: Preg, Serum: NEGATIVE

## 2024-06-06 LAB — TROPONIN T, HIGH SENSITIVITY: Troponin T High Sensitivity: <15 ng/L (ref 0–19)

## 2024-06-06 NOTE — ED Provider Notes (Signed)
 " [place] EMERGENCY DEPARTMENT AT [facility] Provider Note   CSN: [id number] Arrival date & time: 06/06/24  [id number]     Patient presents with: Anxiety   OVIE EASTEP is a 43 y.o. female.   The history is provided by the patient and medical records. No language interpreter was used.  Chest Pain Pain location:  Substernal area Pain quality: dull   Pain radiates to:  Does not radiate Pain severity:  Mild Onset quality:  Gradual Duration:  10 minutes Timing:  Sporadic Progression:  Waxing and waning Chronicity:  Recurrent Relieved by:  Nothing Worsened by:  Nothing Ineffective treatments:  None tried Associated symptoms: anxiety   Associated symptoms: no abdominal pain, no altered mental status, no back pain, no cough, no dizziness, no fatigue, no fever, no headache, no lower extremity edema, no nausea, no near-syncope, no numbness, no palpitations, no shortness of breath, no vomiting and no weakness   Risk factors: prior DVT/PE        Prior to Admission medications  Medication Sig Start Date End Date Taking? Authorizing Provider  acetaminophen  (TYLENOL ) 500 MG tablet Take 500 mg by mouth every 6 (six) hours as needed for headache or fever (pain). Patient taking differently: Take 500 mg by mouth as needed for headache or fever (pain).    [provider]  apixaban  (ELIQUIS ) 5 MG TABS tablet Take 1 tablet (5 mg total) by mouth 2 (two) times daily. 05/12/24   Fenton, Clint R, PA  diltiazem  (CARDIZEM  CD) 120 MG 24 hr capsule Take 1 capsule (120 mg total) by mouth daily. 05/12/24   Fenton, Clint R, PA  diltiazem  (CARDIZEM ) 30 MG tablet Take 1 tablet every 4 hours AS NEEDED for AFIB heart rate >100 as long as top BP >100. 05/12/24   Fenton, Clint R, PA  dronedarone  (MULTAQ ) 400 MG tablet Take 1 tablet (400 mg total) by mouth 2 (two) times daily with a meal. 05/21/24   Fenton, Clint R, PA  ferrous sulfate  325 (65 FE) MG EC tablet Take 1 tablet (325 mg total) by  mouth 2 (two) times daily. 04/18/24 04/18/25  Krishnan, Gokul, MD  meclizine  (ANTIVERT ) 25 MG tablet Take 1 tablet (25 mg total) by mouth 3 (three) times daily as needed for dizziness. 03/16/24   Roselyn Carlin NOVAK, MD  Multiple Vitamins-Minerals (MULTIVITAMIN WOMEN) TABS Take 1 tablet by mouth daily.    [provider]  triamcinolone  cream (KENALOG ) 0.1 % Apply 1 Application topically 2 (two) times daily. 03/16/24   Roselyn Carlin NOVAK, MD    Allergies: Patient has no known allergies.    Review of Systems  Constitutional:  Positive for chills. Negative for fatigue and fever.  HENT:  Negative for congestion.   Eyes:  Negative for visual disturbance.  Respiratory:  Positive for chest tightness. Negative for cough, shortness of breath and wheezing.   Cardiovascular:  Positive for chest pain. Negative for palpitations and near-syncope.  Gastrointestinal:  Negative for abdominal pain, constipation, diarrhea, nausea and vomiting.  Genitourinary:  Negative for dysuria, flank pain and frequency.  Musculoskeletal:  Negative for back pain, neck pain and neck stiffness.  Skin:  Negative for rash and wound.  Neurological:  Positive for light-headedness. Negative for dizziness, weakness, numbness and headaches.  Psychiatric/Behavioral:  Negative for agitation and confusion.   All other systems reviewed and are negative.   Updated Vital Signs BP (!) 144/85   Pulse 94   Temp 98.1 F (36.7 C)   Resp 18  Ht 5' 10 (1.778 m)   Wt (!) 162.3 kg   SpO2 100%   BMI 51.34 kg/m   Physical Exam Vitals and nursing note reviewed.  Constitutional:      General: She is not in acute distress.    Appearance: She is well-developed. She is not ill-appearing, toxic-appearing or diaphoretic.  HENT:     Head: Normocephalic and atraumatic.     Right Ear: External ear normal.     Left Ear: External ear normal.     Nose: Nose normal. No congestion or rhinorrhea.     Mouth/Throat:     Mouth: Mucous  membranes are moist.     Pharynx: No oropharyngeal exudate or posterior oropharyngeal erythema.  Eyes:     Conjunctiva/sclera: Conjunctivae normal.     Pupils: Pupils are equal, round, and reactive to light.  Cardiovascular:     Rate and Rhythm: Normal rate.     Heart sounds: Murmur heard.  Pulmonary:     Effort: No respiratory distress.     Breath sounds: No stridor. No wheezing, rhonchi or rales.  Chest:     Chest wall: No tenderness.  Abdominal:     General: Abdomen is flat. There is no distension.     Tenderness: There is no abdominal tenderness. There is no right CVA tenderness, left CVA tenderness, guarding or rebound.  Musculoskeletal:        General: No tenderness.     Cervical back: Normal range of motion and neck supple. No tenderness.     Right lower leg: No edema.     Left lower leg: No edema.  Skin:    General: Skin is warm.     Findings: No erythema or rash.  Neurological:     General: No focal deficit present.     Mental Status: She is alert and oriented to person, place, and time.     Sensory: No sensory deficit.     Motor: No weakness or abnormal muscle tone.     Deep Tendon Reflexes: Reflexes are normal and symmetric.     (all labs ordered are listed, but only abnormal results are displayed) Labs Reviewed  CBC WITH DIFFERENTIAL/PLATELET - Abnormal; Notable for the following components:      Result Value   Hemoglobin 11.7 (*)    MCH 25.6 (*)    RDW 24.1 (*)    All other components within normal limits  COMPREHENSIVE METABOLIC PANEL WITH GFR - Abnormal; Notable for the following components:   Glucose, Bld 101 (*)    All other components within normal limits  TSH - Abnormal; Notable for the following components:   TSH 0.263 (*)    All other components within normal limits  RESP PANEL BY RT-PCR (RSV, FLU A&B, COVID)  RVPGX2  MAGNESIUM   PRO BRAIN NATRIURETIC PEPTIDE  HCG, SERUM, QUALITATIVE  TROPONIN T, HIGH SENSITIVITY    EKG: EKG  Interpretation Date/Time:  Friday June 06 2024 09:58:51 EST Ventricular Rate:  79 PR Interval:  161 QRS Duration:  111 QT Interval:  381 QTC Calculation: 437 R Axis:   80  Text Interpretation: Sinus rhythm when compared to prior, still sinus rhythm with artifact No STEMI Confirmed by Ginger Barefoot ([id number]) on 06/06/2024 10:15:45 AM  Radiology: DG Chest 2 View Result Date: 06/06/2024 CLINICAL DATA:  Chest tightness.  Chills. EXAM: CHEST - 2 VIEW COMPARISON:  04/16/2024 FINDINGS: Cardiopericardial silhouette is at upper limits of normal for size. The lungs are clear without focal pneumonia, edema,  pneumothorax or pleural effusion. No acute bony abnormality. Telemetry leads overlie the chest. IMPRESSION: No active cardiopulmonary disease. Electronically Signed   By: Camellia Candle M.D.   On: 06/06/2024 10:58     Procedures   Medications Ordered in the ED - No data to display                                  Medical Decision Making Amount and/or Complexity of Data Reviewed Labs: ordered. Radiology: ordered.    ALGIE CALES is a 43 y.o. female with a past medical history significant for atrial fibrillation on Eliquis  with cardioversion last month, protein S deficiency, previous pulmonary embolism, and previous bariatric surgery who presents with chest tightness and shakiness.  Patient reports that she woke up this morning about 4 AM with some jitteriness and shakiness and some chills.  She denies any URI symptoms such as congestion or cough but had some chest tightness with it.  She reports she felt like this when she had A-fib last month and had to be cardioverted.  She reports no recent fevers, congestion, cough, nausea, vomiting, confusion, diarrhea, or urinary changes.  She reports it was just several minutes of tightness that would come and go but is not feeling it now.  She denies shortness of breath with it.  Denies productive cough.  Denies any nausea, vomiting,  constipation, diarrhea, or urinary changes.  She reports that recently over the last few weeks she started Multaq  to go along with her diltiazem  for her A-fib.  Otherwise patient is well-appearing.  On exam, lungs clear.  Chest.  Abdomen nontender.  She does have a slight murmur.  Intact pulses in extremities.  Legs nontender and nonedematous.  Patient resting without distress.  She is not tachycardic or febrile.  Will get EKG to look for A-fib.  Given her chest tightness and her history, we will get cardiac workup including troponin, chest x-ray, EKG, cardiac monitoring, and BNP.  Given her lack of tachycardia, lack of palpitations, nonpleuritic discomfort, and lack of shortness of breath, we will hold on a large thromboembolic workup as patient is on blood thinners and symptoms do not seem consistent with pulm embolism at this time.  Given the shaking chills and the ongoing pandemic and community viral burden, we will check for COVID/flu/RSV.   Anticipate reassessment after workup and EKG to determine disposition.      1:54 PM EKG showed sinus rhythm and patient was on the monitor and appeared to remained in sinus rhythm during her 7-1/2 hours of observation here in the emergency department.  Her TSH was low but this is improved from what it was recently and she is scheduled see endocrinology next week.  Will hold on further workup for this and patient agrees.  Her other labs were overall reassuring.  She was negative for COVID/flu/RSV and troponin is undetectable.  proBNP is not elevated.  Magnesium  is normal.  Other electrolytes were reassuring.  Chest x-ray did not show pneumonia.  Patient otherwise well-appearing.  Given the patient's stability, well appearance, and her remaining in sinus rhythm, we agree with plan for discharge home for outpatient follow-up with her PCP.  She had no other questions or concerns and was discharged in good condition after reassuring workup.  The emergency  department.         Final diagnoses:  Sensation of chest tightness  Jittery feeling  Low TSH  level    ED Discharge Orders     None       Clinical Impression: 1. Sensation of chest tightness   2. Jittery feeling   3. Low TSH level     Disposition: Discharge  Condition: Good  I have discussed the results, Dx and Tx plan with the pt(& family if present). He/she/they expressed understanding and agree(s) with the plan. Discharge instructions discussed at great length. Strict return precautions discussed and pt &/or family have verbalized understanding of the instructions. No further questions at time of discharge.    New Prescriptions   No medications on file    Follow Up: Georgina Speaks, FNP [address] [place] [address] [phone]     [facility] Emergency Department at [facility] [address] [place]  [address] [phone]         Carlina Derks, Lonni PARAS, MD 06/06/24 1359  "

## 2024-06-06 NOTE — Discharge Instructions (Signed)
 Your history, exam, workup today was overall reassuring.  We did not see any evidence of recurrent atrial fibrillation and did not have A-fib during your stay here in the emergency department.  Your x-ray did not show pneumonia and your labs were overall reassuring however your TSH was still low as it has been in the past as I know you are following with endocrine next week.  Please rest and stay hydrated.  If any symptoms change or worsen acutely, please return to the nearest emergency department.

## 2024-06-06 NOTE — ED Triage Notes (Signed)
 Pt bib ems from home with c/o feel anxious, shaky, and jittery since 0400.  100HR 100RA 150/90 104cbg

## 2024-06-07 ENCOUNTER — Telehealth: Payer: Self-pay | Admitting: Cardiology

## 2024-06-07 NOTE — Telephone Encounter (Signed)
 Outpatient service line:  Patient calling reporting continued onset of muscle jitteriness and spasms.  She was evaluated recently at the ED with overall negative workup with EKG with no acute ST-T wave changes and negative troponins.  However she recently has had some abnormal thyroid  levels with a low TSH and elevated T4 so suspect thyroid  dysfunction could be culprit.  Chest x-ray did not have any evidence of pneumonia.  I encouraged her to follow-up with her PCP for further evaluation.

## 2024-06-09 ENCOUNTER — Other Ambulatory Visit: Payer: Self-pay | Admitting: Nurse Practitioner

## 2024-06-09 ENCOUNTER — Telehealth: Payer: Self-pay | Admitting: Nurse Practitioner

## 2024-06-09 DIAGNOSIS — R946 Abnormal results of thyroid function studies: Secondary | ICD-10-CM

## 2024-06-09 NOTE — Telephone Encounter (Signed)
 Called to see how she is doing, no answer left voicemail. Continues to have overactive thyroid , will order thyroid  ultrasound as she waits for appt with Endo. Will f/u on this tomorrow with referral specialist

## 2024-06-11 DIAGNOSIS — D6859 Other primary thrombophilia: Secondary | ICD-10-CM | POA: Diagnosis not present

## 2024-06-11 DIAGNOSIS — R946 Abnormal results of thyroid function studies: Secondary | ICD-10-CM | POA: Diagnosis not present

## 2024-06-11 DIAGNOSIS — Z86711 Personal history of pulmonary embolism: Secondary | ICD-10-CM | POA: Diagnosis not present

## 2024-06-11 DIAGNOSIS — E611 Iron deficiency: Secondary | ICD-10-CM | POA: Diagnosis not present

## 2024-06-13 ENCOUNTER — Telehealth (HOSPITAL_COMMUNITY): Payer: Self-pay | Admitting: *Deleted

## 2024-06-13 NOTE — Telephone Encounter (Signed)
 Patient called in stating she is experiencing significant insomnia and tremors and wonders if related to multaq  or diltiazem .  Discussed with Daril Kicks PA -- drug information does not show symptoms with multaq  but <2% with diltiazem ; will hold diltiazem  and see if symptoms improve. Monitor BP/HR off diltiazem . Pt will call back in 1 week with update of response.  Pt in agreement.

## 2024-06-17 ENCOUNTER — Other Ambulatory Visit (HOSPITAL_COMMUNITY): Payer: Self-pay

## 2024-06-18 ENCOUNTER — Encounter: Payer: Self-pay | Admitting: Pharmacist

## 2024-06-18 ENCOUNTER — Ambulatory Visit
Admission: RE | Admit: 2024-06-18 | Discharge: 2024-06-18 | Disposition: A | Discharge status: Home / Self Care | Source: Ambulatory Visit | Attending: Nurse Practitioner | Admitting: Nurse Practitioner

## 2024-06-18 ENCOUNTER — Other Ambulatory Visit (HOSPITAL_COMMUNITY): Payer: Self-pay

## 2024-06-18 ENCOUNTER — Other Ambulatory Visit: Payer: Self-pay

## 2024-06-18 DIAGNOSIS — R946 Abnormal results of thyroid function studies: Secondary | ICD-10-CM

## 2024-06-25 ENCOUNTER — Other Ambulatory Visit (HOSPITAL_COMMUNITY): Payer: Self-pay

## 2024-06-25 MED ORDER — PROMETHAZINE-DM 6.25-15 MG/5ML PO SYRP
5.0000 mL | ORAL_SOLUTION | Freq: Three times a day (TID) | ORAL | 0 refills | Status: AC | PRN
  Filled 2024-06-25: qty 120, 8d supply, fill #0

## 2024-06-25 MED ORDER — OSELTAMIVIR PHOSPHATE 75 MG PO CAPS
75.0000 mg | ORAL_CAPSULE | Freq: Two times a day (BID) | ORAL | 0 refills | Status: AC
  Filled 2024-06-25 – 2024-06-26 (×2): qty 10, 5d supply, fill #0

## 2024-06-26 ENCOUNTER — Other Ambulatory Visit (HOSPITAL_COMMUNITY): Payer: Self-pay

## 2024-07-01 ENCOUNTER — Ambulatory Visit: Payer: Self-pay | Admitting: Nurse Practitioner

## 2024-07-11 ENCOUNTER — Encounter (HOSPITAL_BASED_OUTPATIENT_CLINIC_OR_DEPARTMENT_OTHER): Payer: Self-pay | Admitting: Emergency Medicine

## 2024-07-11 ENCOUNTER — Emergency Department (HOSPITAL_BASED_OUTPATIENT_CLINIC_OR_DEPARTMENT_OTHER)
Admission: EM | Admit: 2024-07-11 | Discharge: 2024-07-11 | Disposition: A | Discharge status: Home / Self Care | Attending: Emergency Medicine | Admitting: Emergency Medicine

## 2024-07-11 ENCOUNTER — Other Ambulatory Visit: Payer: Self-pay

## 2024-07-11 DIAGNOSIS — Z7901 Long term (current) use of anticoagulants: Secondary | ICD-10-CM | POA: Insufficient documentation

## 2024-07-11 DIAGNOSIS — W000XXA Fall on same level due to ice and snow, initial encounter: Secondary | ICD-10-CM | POA: Diagnosis not present

## 2024-07-11 DIAGNOSIS — S8011XA Contusion of right lower leg, initial encounter: Secondary | ICD-10-CM | POA: Diagnosis not present

## 2024-07-11 DIAGNOSIS — S8991XA Unspecified injury of right lower leg, initial encounter: Secondary | ICD-10-CM | POA: Diagnosis present

## 2024-07-11 MED ORDER — OXYCODONE-ACETAMINOPHEN 5-325 MG PO TABS
1.0000 | ORAL_TABLET | Freq: Once | ORAL | Status: AC
  Administered 2024-07-11: 1 via ORAL
  Filled 2024-07-11: qty 1

## 2024-07-11 NOTE — ED Provider Notes (Signed)
 " [facility] AT [facility] Provider Note   CSN: [id number] Arrival date & time: 07/11/24  9946     Patient presents with: Fall (Leg pain)   [name] is a 44 y.o. female.   HPI     This a 44 year old female who presents with right leg pain.  Patient reports that she fell last Sunday on Ali ice.  She was seen and evaluated at urgent care yesterday and negative x-rays.  She has been ambulatory.  She has noted swelling and bruising to Ali right lower leg.  She reports that she was told to keep and elevated and iced.  Urgent care notes reviewed.  X-ray documented as negative with exception of small effusion.  Prior to Admission medications  Medication Sig Start Date End Date Taking? Authorizing Provider  acetaminophen  (TYLENOL ) 500 MG tablet Take 500 mg by mouth every 6 (six) hours as needed for headache or fever (pain). Patient taking differently: Take 500 mg by mouth as needed for headache or fever (pain).    [provider]  apixaban  (ELIQUIS ) 5 MG TABS tablet Take 1 tablet (5 mg total) by mouth 2 (two) times daily. 05/12/24   Fenton, Clint R, PA  diltiazem  (CARDIZEM  CD) 120 MG 24 hr capsule Take 1 capsule (120 mg total) by mouth daily. 05/12/24   Fenton, Clint R, PA  diltiazem  (CARDIZEM ) 30 MG tablet Take 1 tablet every 4 hours AS NEEDED for AFIB heart rate >100 as long as top BP >100. 05/12/24   Fenton, Clint R, PA  dronedarone  (MULTAQ ) 400 MG tablet Take 1 tablet (400 mg total) by mouth 2 (two) times daily with a meal. 05/21/24   Fenton, Clint R, PA  ferrous sulfate  325 (65 FE) MG EC tablet Take 1 tablet (325 mg total) by mouth 2 (two) times daily. 04/18/24 04/18/25  Krishnan, Gokul, MD  meclizine  (ANTIVERT ) 25 MG tablet Take 1 tablet (25 mg total) by mouth 3 (three) times daily as needed for dizziness. 03/16/24   Roselyn Carlin NOVAK, MD  Multiple Vitamins-Minerals (MULTIVITAMIN WOMEN) TABS Take 1 tablet by mouth daily.    [provider]  oseltamivir  (TAMIFLU ) 75 MG capsule Take 1 capsule (75 mg total) by mouth 2 (two) times daily for 5 days. 06/25/24     promethazine -dextromethorphan (PROMETHAZINE -DM) 6.25-15 MG/5ML syrup Take 5 mLs by mouth 3 (three) times daily as needed for Cough. 06/25/24     triamcinolone  cream (KENALOG ) 0.1 % Apply 1 Application topically 2 (two) times daily. 03/16/24   Roselyn Carlin NOVAK, MD    Allergies: Patient has no known allergies.    Review of Systems  Constitutional:  Negative for fever.  Musculoskeletal:        Leg pain  All other systems reviewed and are negative.   Updated Vital Signs BP (!) 152/101 (BP Location: Right Arm)   Pulse 90   Temp 98 F (36.7 C) (Temporal)   Resp 18   SpO2 100%   Physical Exam Vitals and nursing note reviewed.  Constitutional:      Appearance: She is well-developed. She is obese. She is not ill-appearing.  HENT:     Head: Normocephalic and atraumatic.  Eyes:     Pupils: Pupils are equal, round, and reactive to light.  Cardiovascular:     Rate and Rhythm: Normal rate and regular rhythm.  Pulmonary:     Effort: Pulmonary effort is normal. No respiratory distress.  Abdominal:     Palpations: Abdomen  is soft.  Musculoskeletal:     Cervical back: Neck supple.     Comments: Tenderness palpation over Ali right lower leg distal to Ali knee, normal range of motion at Ali knee, extensive hematoma noted anterior extending medially with tenderness to palpation  Skin:    General: Skin is warm and dry.  Neurological:     Mental Status: She is alert and oriented to person, place, and time.     (all labs ordered are listed, but only abnormal results are displayed) Labs Reviewed - No data to display  EKG: None  Radiology: No results found.   Procedures   Medications Ordered in [facility] ED  oxyCODONE -acetaminophen  (PERCOCET/ROXICET) 5-325 MG per tablet 1 tablet (1 tablet Oral Given 07/11/24 0154)                                    Medical  Decision Making Risk Prescription drug management.   This patient presents to [facility] ED for concern of leg pain, this involves an extensive number of treatment options, and is a complaint that carries with it a high risk of complications and morbidity.  I considered Ali following differential and admission for this acute, potentially life threatening condition.  Ali differential diagnosis includes fracture, ligamentous injury, hematoma  MDM:    This is a 44 year old female who presents with leg pain.  Injury 5 days ago.  Negative x-rays urgent care.  She has a large hematoma to Ali right lower leg mostly anteriorly and medially.  There is significant tenderness to palpation.  She has been compressing some but has been taking off Ali compression at night.  She is tender right over Ali hematoma with normal range of motion of Ali knee.  Have lower suspicion for occult fracture or ligamentous injury.  She is on Eliquis  for atrial fibrillation and remote history of PE.  Recommend ice several times a day and compression with an Ace bandage.  (Labs, imaging, consults)  Labs: I Ordered, and personally interpreted labs.  Ali pertinent results include: N/A  Imaging Studies ordered: I ordered imaging studies including N/A I independently visualized and interpreted imaging. I agree with Ali radiologist interpretation  Additional history obtained from chart review.  External records from outside source obtained and reviewed including urgent care notes  Cardiac Monitoring: Ali patient was not maintained on a cardiac monitor.  If on Ali cardiac monitor, I personally viewed and interpreted Ali cardiac monitored which showed an underlying rhythm of: N/A  Reevaluation: After Ali interventions noted above, I reevaluated Ali patient and found that they have :stayed Ali same  Social Determinants of Health:  lives independently  Disposition: Discharge  Co morbidities that complicate Ali patient  evaluation  Past Medical History:  Diagnosis Date   Acute blood loss anemia 02/06/2015   COVID-19 vaccination declined 04/12/2023   Ectopic pregnancy    Encounter for screening mammogram for breast cancer 04/12/2023   Gestational hypertension, antepartum 02/03/2015   Iron  deficiency anemia of pregnancy 02/06/2015   PE (pulmonary embolism)    Postpartum care following vaginal delivery (2/2) 07/15/2013   Pregnancy induced hypertension    SVD (spontaneous vaginal delivery) 07/22/2018     Medicines Meds ordered this encounter  Medications   oxyCODONE -acetaminophen  (PERCOCET/ROXICET) 5-325 MG per tablet 1 tablet    Refill:  0    I have reviewed Ali patients home medicines and have made adjustments as needed  Problem  List / ED Course: Problem List Items Addressed This Visit   None Visit Diagnoses       Hematoma of right lower extremity, initial encounter    -  Primary                Final diagnoses:  Hematoma of right lower extremity, initial encounter    ED Discharge Orders     None          Bari Charmaine FALCON, MD 07/11/24 0207  "

## 2024-07-11 NOTE — Discharge Instructions (Addendum)
 You were seen today for leg pain.  You have a hematoma of the leg which is likely related to trauma and being on blood thinners.  Ice 20 minutes on 4-6 times a day.  Keep elevated.  Use an Ace bandage to apply compression.

## 2024-07-14 ENCOUNTER — Emergency Department (HOSPITAL_BASED_OUTPATIENT_CLINIC_OR_DEPARTMENT_OTHER)
Admission: EM | Admit: 2024-07-14 | Discharge: 2024-07-14 | Disposition: A | Discharge status: Home / Self Care | Attending: Emergency Medicine | Admitting: Emergency Medicine

## 2024-07-14 ENCOUNTER — Other Ambulatory Visit: Payer: Self-pay

## 2024-07-14 ENCOUNTER — Emergency Department (HOSPITAL_BASED_OUTPATIENT_CLINIC_OR_DEPARTMENT_OTHER)

## 2024-07-14 ENCOUNTER — Ambulatory Visit: Payer: Self-pay

## 2024-07-14 ENCOUNTER — Emergency Department (HOSPITAL_BASED_OUTPATIENT_CLINIC_OR_DEPARTMENT_OTHER): Admitting: Radiology

## 2024-07-14 ENCOUNTER — Encounter (HOSPITAL_BASED_OUTPATIENT_CLINIC_OR_DEPARTMENT_OTHER): Payer: Self-pay

## 2024-07-14 DIAGNOSIS — M25561 Pain in right knee: Secondary | ICD-10-CM | POA: Insufficient documentation

## 2024-07-14 DIAGNOSIS — S8011XA Contusion of right lower leg, initial encounter: Secondary | ICD-10-CM | POA: Insufficient documentation

## 2024-07-14 DIAGNOSIS — Z7901 Long term (current) use of anticoagulants: Secondary | ICD-10-CM | POA: Insufficient documentation

## 2024-07-14 DIAGNOSIS — W19XXXA Unspecified fall, initial encounter: Secondary | ICD-10-CM | POA: Insufficient documentation

## 2024-07-14 MED ORDER — ACETAMINOPHEN 500 MG PO TABS
1000.0000 mg | ORAL_TABLET | Freq: Once | ORAL | Status: AC
  Administered 2024-07-14: 1000 mg via ORAL
  Filled 2024-07-14: qty 2

## 2024-07-14 NOTE — Telephone Encounter (Signed)
 Fall on Sunday 1/25, slipped on ice, injured RLE Has been icing and elevating Friday ED 1/30, Dx with hematoma to RLE Ongoing pain and new onset numbness and tingling, ED advised, pt agreeable    FYI Only or Action Required?: FYI only for provider: ED advised.  Patient was last seen in primary care on 04/24/2024 by Georgina Speaks, FNP.  Called Nurse Triage reporting Leg Injury.  Symptoms began a week ago.  Interventions attempted: Ice/heat application.  Symptoms are: gradually worsening.  Triage Disposition: Go to ED Now (or PCP Triage)  Patient/caregiver understands and will follow disposition?: Yes   Reason for Triage: patient states she has had a fall on last week and went to urgent care/ED. She is still experiencing pain and swelling in her leg   Reason for Disposition  [1] New numbness (loss of sensation) or weakness of foot or toe(s) AND [2] present now  Answer Assessment - Initial Assessment Questions 1. MECHANISM: How did the injury happen? (e.g., twisting injury, direct blow)      Slipped and fell on ice approx 1/25  2. ONSET: When did the injury happen? (e.g., minutes, hours ago)      1/25, diagnosed with hematoma in ED 1/30  3. LOCATION: Where is the injury located?      RLE  4. APPEARANCE of INJURY: What does the injury look like?  (e.g., deformity of leg)     Black and blue bruising  5. SEVERITY: Can you put weight on that leg? Can you walk?      Yes  7. PAIN: Is there pain? If Yes, ask: How bad is the pain?   What does it keep you from doing? (Scale 0-10; or none, mild, moderate, severe)    Yes 5/10   9. OTHER SYMPTOMS: Do you have any other symptoms?      Tingling to right foot, and kind of hard to the side of my leg, warmth  Protocols used: Leg Injury-A-AH

## 2024-07-14 NOTE — Discharge Instructions (Signed)
 Please use Tylenol or ibuprofen for pain.  You may use 600 mg ibuprofen every 6 hours or 1000 mg of Tylenol every 6 hours.  You may choose to alternate between the 2.  This would be most effective.  Not to exceed 4 g of Tylenol within 24 hours.  Not to exceed 3200 mg ibuprofen 24 hours.

## 2024-07-14 NOTE — Telephone Encounter (Signed)
 Please send an updated letter until the end of February  Thanks

## 2024-07-17 ENCOUNTER — Other Ambulatory Visit (HOSPITAL_COMMUNITY): Payer: Self-pay

## 2024-08-20 ENCOUNTER — Encounter: Payer: Self-pay | Admitting: Nurse Practitioner

## 2024-08-27 ENCOUNTER — Ambulatory Visit (HOSPITAL_COMMUNITY): Admitting: Physician Assistant
